# Patient Record
Sex: Male | Born: 1935 | Race: White | Hispanic: No | Marital: Single | Smoking: Former smoker
Health system: Southern US, Community
[De-identification: ages and names within clinical notes are randomized; demographics above are authoritative.]

## PROBLEM LIST (undated history)

## (undated) DIAGNOSIS — I509 Heart failure, unspecified: Secondary | ICD-10-CM

## (undated) DIAGNOSIS — N400 Enlarged prostate without lower urinary tract symptoms: Secondary | ICD-10-CM

## (undated) DIAGNOSIS — F0390 Unspecified dementia without behavioral disturbance: Secondary | ICD-10-CM

## (undated) DIAGNOSIS — N390 Urinary tract infection, site not specified: Secondary | ICD-10-CM

## (undated) DIAGNOSIS — I10 Essential (primary) hypertension: Secondary | ICD-10-CM

## (undated) DIAGNOSIS — G930 Cerebral cysts: Secondary | ICD-10-CM

## (undated) DIAGNOSIS — M47816 Spondylosis without myelopathy or radiculopathy, lumbar region: Secondary | ICD-10-CM

## (undated) DIAGNOSIS — I4891 Unspecified atrial fibrillation: Secondary | ICD-10-CM

## (undated) DIAGNOSIS — A419 Sepsis, unspecified organism: Secondary | ICD-10-CM

## (undated) DIAGNOSIS — R339 Retention of urine, unspecified: Secondary | ICD-10-CM

## (undated) HISTORY — PX: OTHER SURGICAL HISTORY: SHX169

## (undated) HISTORY — PX: SUPRAPUBIC CATHETER PLACEMENT: SHX2473

## (undated) HISTORY — PX: TRANSURETHRAL RESECTION OF PROSTATE: SHX73

---

## 2001-07-19 ENCOUNTER — Emergency Department (HOSPITAL_COMMUNITY): Admission: EM | Admit: 2001-07-19 | Discharge: 2001-07-20 | Payer: Self-pay | Admitting: *Deleted

## 2001-08-09 ENCOUNTER — Encounter: Admission: RE | Admit: 2001-08-09 | Discharge: 2001-11-04 | Payer: Self-pay | Admitting: Sports Medicine

## 2004-05-16 ENCOUNTER — Emergency Department (HOSPITAL_COMMUNITY): Admission: EM | Admit: 2004-05-16 | Discharge: 2004-05-16 | Payer: Self-pay | Admitting: Emergency Medicine

## 2004-05-27 ENCOUNTER — Inpatient Hospital Stay (HOSPITAL_COMMUNITY): Admission: EM | Admit: 2004-05-27 | Discharge: 2004-05-31 | Payer: Self-pay | Admitting: Emergency Medicine

## 2004-05-28 ENCOUNTER — Encounter: Payer: Self-pay | Admitting: Cardiology

## 2004-06-13 ENCOUNTER — Encounter: Admission: RE | Admit: 2004-06-13 | Discharge: 2004-06-13 | Payer: Self-pay | Admitting: Internal Medicine

## 2004-11-21 ENCOUNTER — Emergency Department (HOSPITAL_COMMUNITY): Admission: EM | Admit: 2004-11-21 | Discharge: 2004-11-21 | Payer: Self-pay | Admitting: Emergency Medicine

## 2005-02-03 ENCOUNTER — Ambulatory Visit (HOSPITAL_COMMUNITY): Admission: RE | Admit: 2005-02-03 | Discharge: 2005-02-03 | Payer: Self-pay | Admitting: Urology

## 2005-10-25 IMAGING — CT CT ANGIO CHEST
4 of 5 series · 19 of 30 positions shown · IV contrast (140)
Comparison: none

CLINICAL DATA: Chest tightness, cough, and shortness of breath. 
 CHEST CT ANGIO WITH CONTRAST
 Multidetector CT imaging of the chest was performed according to the protocol for detection of pulmonary embolism during IV bolus injection of 200 ml Omnipaque 300.  Coronal and sagittal plane reformatted images were also generated.  
 The pulmonary arteries are well opacified on the study to the subsegmental level and demonstrate no filling defects by CT to suggest pulmonary embolism.  Lung windows show no focal infiltrates or edema. No pleural effusions.  No adenopathy is seen in the chest. 

 IMPRESSION
 No evidence of pulmonary embolism or other acute findings in the chest.

[Series 4: recon 2: pe · axial · 0.60mm/px · z∈[-209,-59]mm · 5 of 101 slices shown]
[im 21/101  lung]
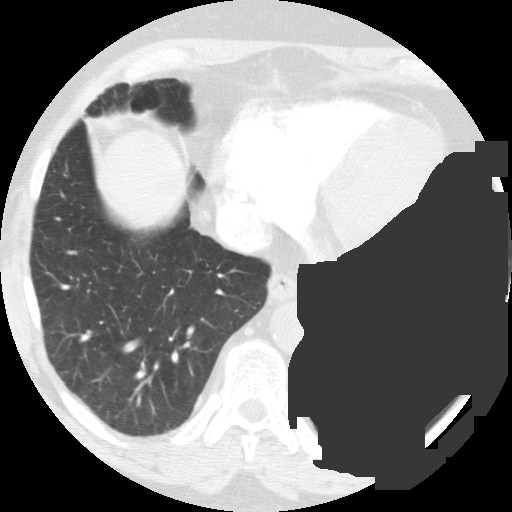
[im 41/101  mediastinal]
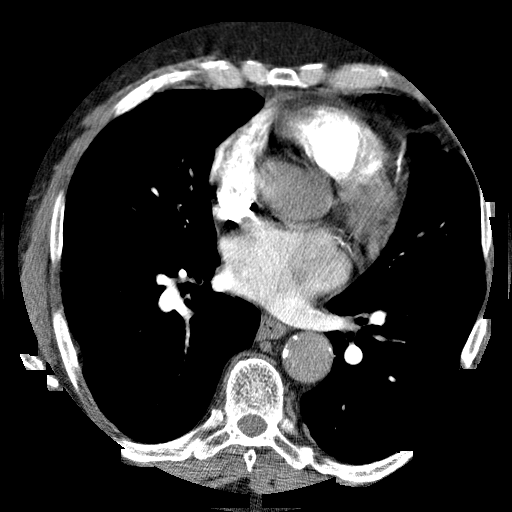
[im 53/101  lung]
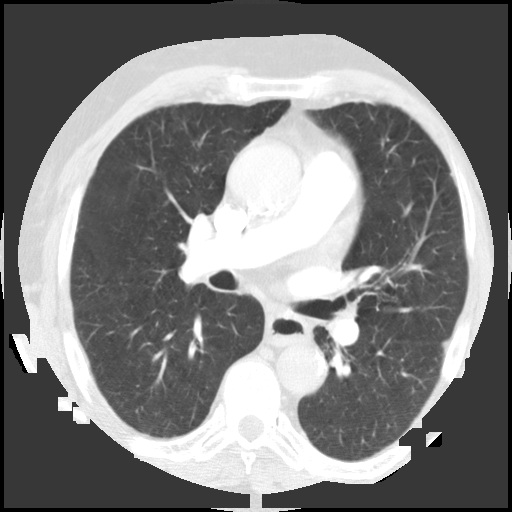
[im 61/101  mediastinal]
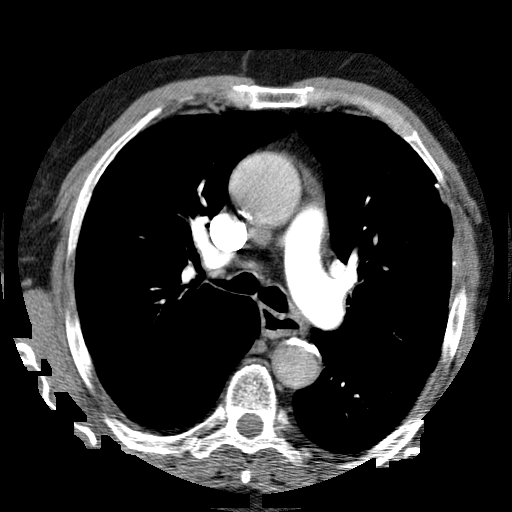
[im 81/101  lung]
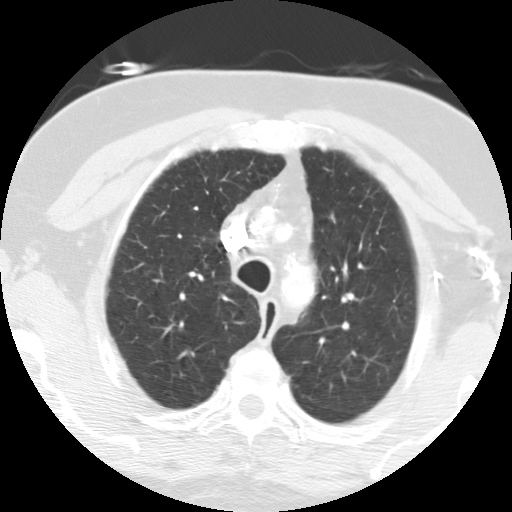

[Series 5: recon 3: pe · axial · 0.60mm/px · z∈[-209,-59]mm · 5 of 101 slices shown]
[im 21/101  lung]
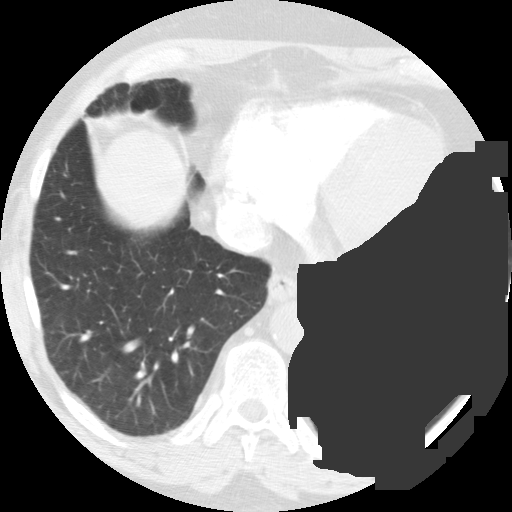
[im 41/101  lung]
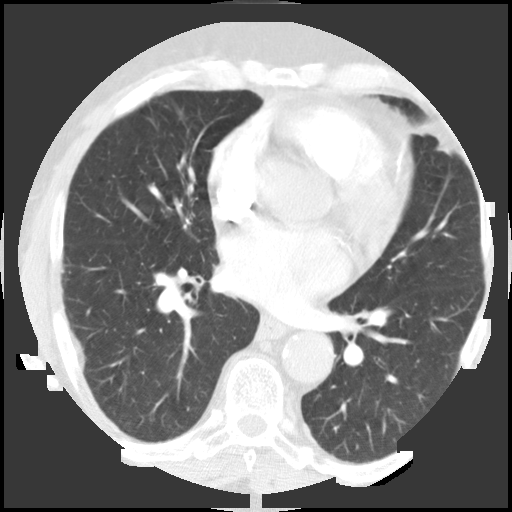
[im 53/101  lung]
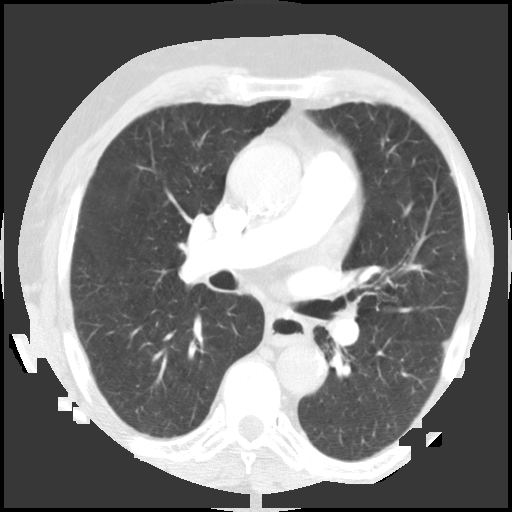
[im 61/101  lung]
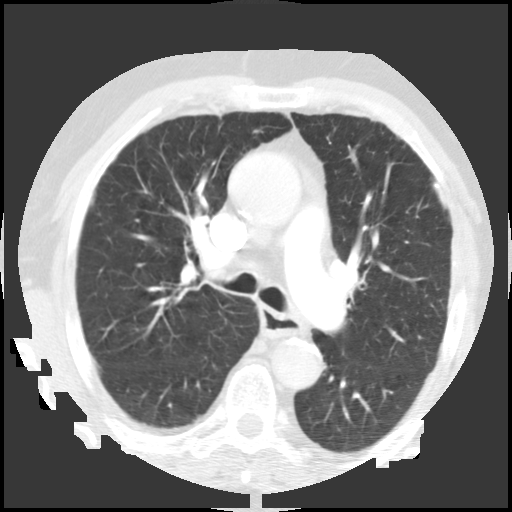
[im 81/101  lung]
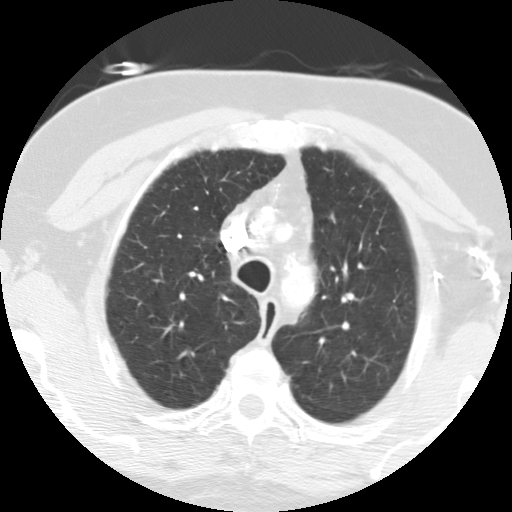

[Series 686: reformatted · sagittal · 0.60mm/px · 5 of 113 slices shown (1 of 2)]
[im 19/113  lung]
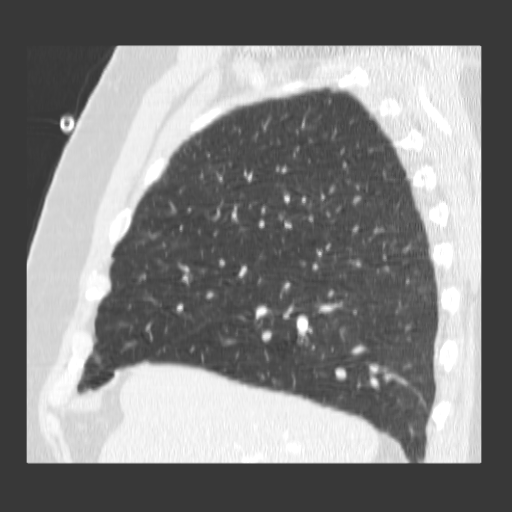
[im 38/113  lung]
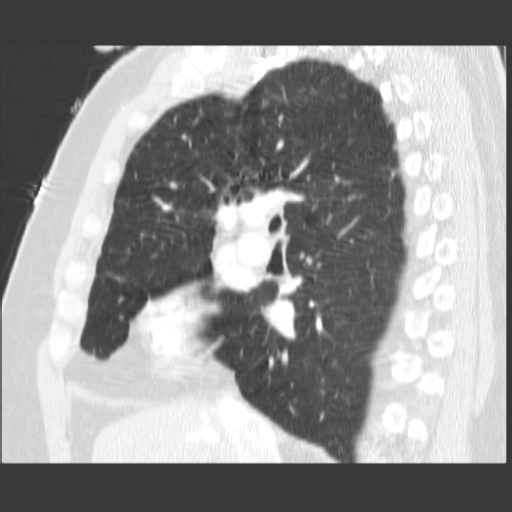
[im 57/113  lung]
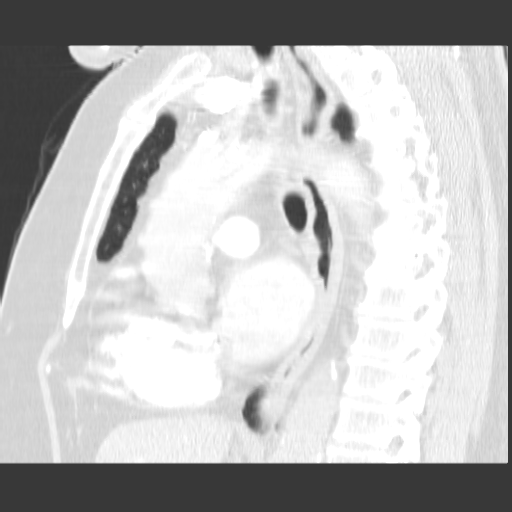
[im 75/113  lung]
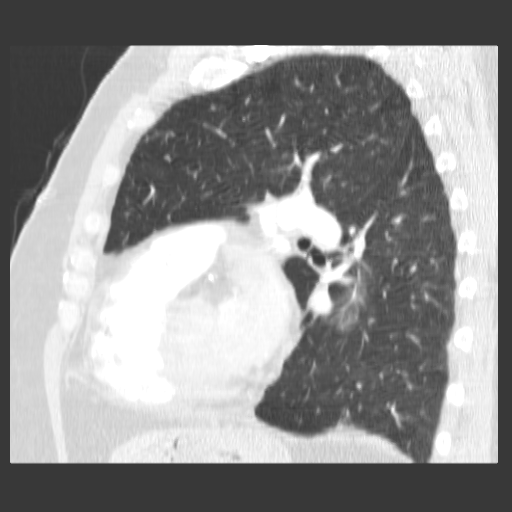
[im 94/113  lung]
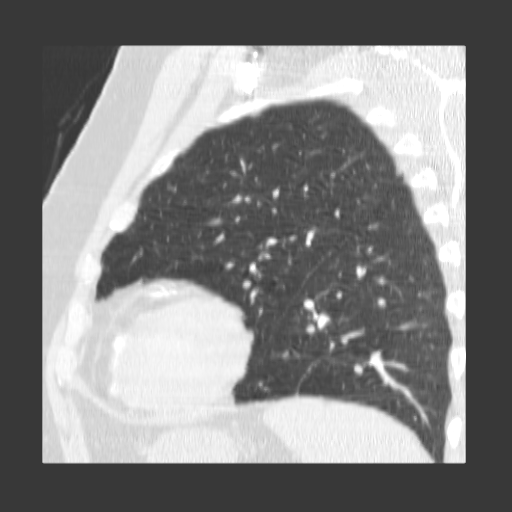

[Series 687: reformatted · coronal · 0.60mm/px · 4 of 98 slices shown (2 of 2)]
[im 20/98  lung]
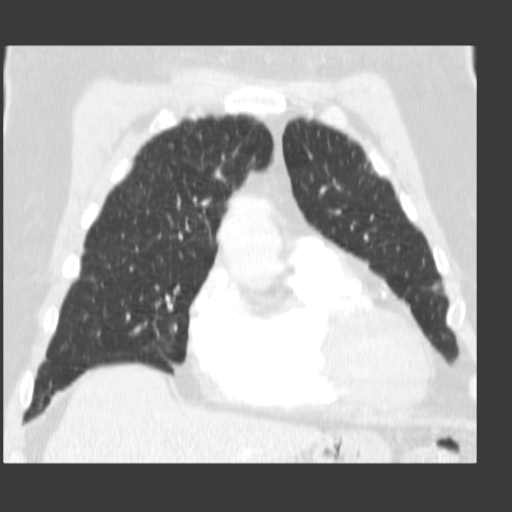
[im 39/98  lung]
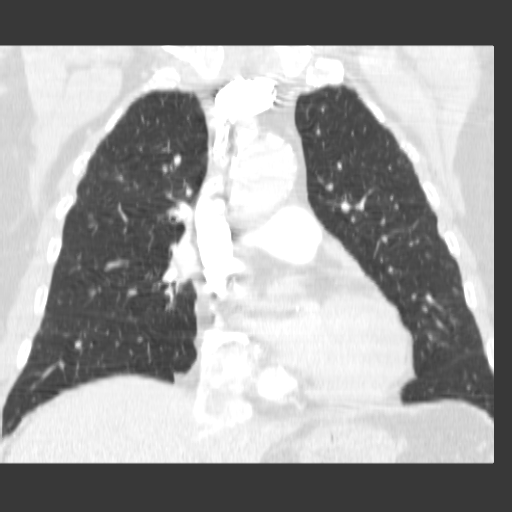
[im 59/98  lung]
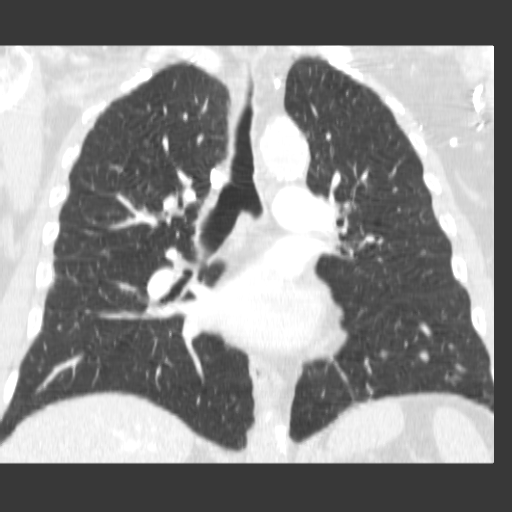
[im 78/98  lung]
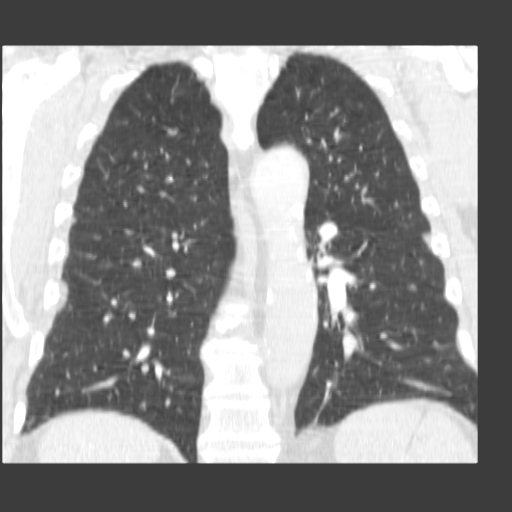

[19 of 30 positions shown; findings below may reference images not displayed]

## 2007-10-05 ENCOUNTER — Emergency Department (HOSPITAL_COMMUNITY): Admission: EM | Admit: 2007-10-05 | Discharge: 2007-10-06 | Payer: Self-pay | Admitting: Emergency Medicine

## 2008-11-27 ENCOUNTER — Ambulatory Visit (HOSPITAL_COMMUNITY): Admission: RE | Admit: 2008-11-27 | Discharge: 2008-11-27 | Payer: Self-pay | Admitting: *Deleted

## 2009-10-23 ENCOUNTER — Emergency Department (HOSPITAL_COMMUNITY): Admission: EM | Admit: 2009-10-23 | Discharge: 2009-10-23 | Payer: Self-pay | Admitting: Emergency Medicine

## 2009-11-03 ENCOUNTER — Emergency Department (HOSPITAL_COMMUNITY): Admission: EM | Admit: 2009-11-03 | Discharge: 2009-11-03 | Payer: Self-pay | Admitting: Emergency Medicine

## 2009-11-06 ENCOUNTER — Emergency Department (HOSPITAL_COMMUNITY): Admission: EM | Admit: 2009-11-06 | Discharge: 2009-11-06 | Payer: Self-pay | Admitting: Emergency Medicine

## 2009-11-07 ENCOUNTER — Emergency Department (HOSPITAL_COMMUNITY): Admission: EM | Admit: 2009-11-07 | Discharge: 2009-11-07 | Payer: Self-pay | Admitting: Emergency Medicine

## 2009-11-28 ENCOUNTER — Emergency Department (HOSPITAL_COMMUNITY): Admission: EM | Admit: 2009-11-28 | Discharge: 2009-11-28 | Payer: Self-pay | Admitting: Emergency Medicine

## 2009-12-15 ENCOUNTER — Emergency Department (HOSPITAL_COMMUNITY): Admission: EM | Admit: 2009-12-15 | Discharge: 2009-12-16 | Payer: Self-pay | Admitting: Emergency Medicine

## 2009-12-21 ENCOUNTER — Emergency Department (HOSPITAL_COMMUNITY): Admission: EM | Admit: 2009-12-21 | Discharge: 2009-12-21 | Payer: Self-pay | Admitting: Emergency Medicine

## 2009-12-25 ENCOUNTER — Emergency Department (HOSPITAL_COMMUNITY): Admission: EM | Admit: 2009-12-25 | Discharge: 2009-12-25 | Payer: Self-pay | Admitting: Emergency Medicine

## 2009-12-29 ENCOUNTER — Emergency Department (HOSPITAL_COMMUNITY): Admission: EM | Admit: 2009-12-29 | Discharge: 2009-12-29 | Payer: Self-pay | Admitting: Emergency Medicine

## 2010-01-01 ENCOUNTER — Emergency Department (HOSPITAL_COMMUNITY): Admission: EM | Admit: 2010-01-01 | Discharge: 2010-01-02 | Payer: Self-pay | Admitting: Emergency Medicine

## 2010-01-02 ENCOUNTER — Emergency Department (HOSPITAL_COMMUNITY): Admission: EM | Admit: 2010-01-02 | Discharge: 2010-01-02 | Payer: Self-pay | Admitting: Emergency Medicine

## 2010-01-03 ENCOUNTER — Emergency Department (HOSPITAL_COMMUNITY): Admission: EM | Admit: 2010-01-03 | Discharge: 2010-01-04 | Payer: Self-pay | Admitting: Emergency Medicine

## 2010-01-03 ENCOUNTER — Emergency Department (HOSPITAL_COMMUNITY): Admission: EM | Admit: 2010-01-03 | Discharge: 2010-01-03 | Payer: Self-pay | Admitting: Emergency Medicine

## 2010-01-06 ENCOUNTER — Emergency Department (HOSPITAL_COMMUNITY): Admission: EM | Admit: 2010-01-06 | Discharge: 2010-01-06 | Payer: Self-pay | Admitting: Emergency Medicine

## 2010-01-08 ENCOUNTER — Emergency Department (HOSPITAL_COMMUNITY): Admission: EM | Admit: 2010-01-08 | Discharge: 2010-01-08 | Payer: Self-pay | Admitting: Emergency Medicine

## 2010-01-10 ENCOUNTER — Emergency Department (HOSPITAL_COMMUNITY): Admission: EM | Admit: 2010-01-10 | Discharge: 2010-01-10 | Payer: Self-pay | Admitting: Emergency Medicine

## 2010-01-12 ENCOUNTER — Emergency Department (HOSPITAL_COMMUNITY): Admission: EM | Admit: 2010-01-12 | Discharge: 2010-01-12 | Payer: Self-pay | Admitting: Emergency Medicine

## 2010-01-13 ENCOUNTER — Emergency Department (HOSPITAL_COMMUNITY): Admission: EM | Admit: 2010-01-13 | Discharge: 2010-01-13 | Payer: Self-pay | Admitting: Emergency Medicine

## 2010-01-14 ENCOUNTER — Emergency Department (HOSPITAL_COMMUNITY): Admission: EM | Admit: 2010-01-14 | Discharge: 2010-01-14 | Payer: Self-pay | Admitting: Emergency Medicine

## 2010-01-17 ENCOUNTER — Emergency Department (HOSPITAL_COMMUNITY)
Admission: EM | Admit: 2010-01-17 | Discharge: 2010-01-17 | Payer: Self-pay | Source: Home / Self Care | Admitting: Emergency Medicine

## 2010-01-19 ENCOUNTER — Emergency Department (HOSPITAL_COMMUNITY): Admission: EM | Admit: 2010-01-19 | Discharge: 2010-01-19 | Payer: Self-pay | Admitting: Emergency Medicine

## 2010-01-22 ENCOUNTER — Emergency Department (HOSPITAL_COMMUNITY): Admission: EM | Admit: 2010-01-22 | Discharge: 2010-01-22 | Payer: Self-pay | Admitting: Emergency Medicine

## 2010-01-23 ENCOUNTER — Emergency Department (HOSPITAL_COMMUNITY): Admission: EM | Admit: 2010-01-23 | Discharge: 2010-01-23 | Payer: Self-pay | Admitting: Emergency Medicine

## 2010-01-24 ENCOUNTER — Emergency Department (HOSPITAL_COMMUNITY): Admission: EM | Admit: 2010-01-24 | Discharge: 2010-01-24 | Payer: Self-pay | Admitting: Emergency Medicine

## 2010-01-27 ENCOUNTER — Emergency Department (HOSPITAL_COMMUNITY): Admission: EM | Admit: 2010-01-27 | Discharge: 2010-01-27 | Payer: Self-pay | Admitting: Emergency Medicine

## 2010-01-28 ENCOUNTER — Emergency Department (HOSPITAL_COMMUNITY): Admission: EM | Admit: 2010-01-28 | Discharge: 2010-01-28 | Payer: Self-pay | Admitting: Emergency Medicine

## 2010-01-31 ENCOUNTER — Emergency Department (HOSPITAL_COMMUNITY): Admission: EM | Admit: 2010-01-31 | Discharge: 2010-01-31 | Payer: Self-pay | Admitting: Emergency Medicine

## 2010-02-01 ENCOUNTER — Encounter: Payer: Self-pay | Admitting: Cardiology

## 2010-02-01 ENCOUNTER — Other Ambulatory Visit: Payer: Self-pay | Admitting: Emergency Medicine

## 2010-02-01 ENCOUNTER — Encounter: Payer: Self-pay | Admitting: Emergency Medicine

## 2010-02-01 ENCOUNTER — Ambulatory Visit: Payer: Self-pay | Admitting: Internal Medicine

## 2010-02-02 ENCOUNTER — Inpatient Hospital Stay (HOSPITAL_COMMUNITY): Admission: EM | Admit: 2010-02-02 | Discharge: 2010-02-04 | Payer: Self-pay | Admitting: Cardiology

## 2010-02-03 ENCOUNTER — Encounter: Payer: Self-pay | Admitting: Cardiology

## 2010-02-11 ENCOUNTER — Emergency Department (HOSPITAL_COMMUNITY): Admission: EM | Admit: 2010-02-11 | Discharge: 2010-02-11 | Payer: Self-pay | Admitting: Emergency Medicine

## 2010-02-17 ENCOUNTER — Encounter (INDEPENDENT_AMBULATORY_CARE_PROVIDER_SITE_OTHER): Payer: Self-pay | Admitting: Urology

## 2010-02-17 ENCOUNTER — Inpatient Hospital Stay (HOSPITAL_COMMUNITY): Admission: RE | Admit: 2010-02-17 | Discharge: 2010-02-19 | Payer: Self-pay | Admitting: Urology

## 2010-02-23 ENCOUNTER — Emergency Department (HOSPITAL_COMMUNITY): Admission: EM | Admit: 2010-02-23 | Discharge: 2010-02-23 | Payer: Self-pay | Admitting: Emergency Medicine

## 2010-03-12 DIAGNOSIS — I10 Essential (primary) hypertension: Secondary | ICD-10-CM

## 2010-03-12 DIAGNOSIS — I4891 Unspecified atrial fibrillation: Secondary | ICD-10-CM

## 2010-03-12 DIAGNOSIS — N400 Enlarged prostate without lower urinary tract symptoms: Secondary | ICD-10-CM | POA: Insufficient documentation

## 2010-03-13 ENCOUNTER — Emergency Department (HOSPITAL_COMMUNITY): Admission: EM | Admit: 2010-03-13 | Discharge: 2010-03-13 | Payer: Self-pay | Admitting: Emergency Medicine

## 2010-03-18 DIAGNOSIS — E669 Obesity, unspecified: Secondary | ICD-10-CM

## 2010-05-28 ENCOUNTER — Emergency Department (HOSPITAL_COMMUNITY): Admission: EM | Admit: 2010-05-28 | Discharge: 2010-05-28 | Payer: Self-pay | Admitting: Emergency Medicine

## 2010-06-26 ENCOUNTER — Emergency Department (HOSPITAL_COMMUNITY): Admission: EM | Admit: 2010-06-26 | Discharge: 2010-06-26 | Payer: Self-pay | Admitting: Emergency Medicine

## 2010-07-13 ENCOUNTER — Emergency Department (HOSPITAL_COMMUNITY): Admission: EM | Admit: 2010-07-13 | Discharge: 2010-07-13 | Payer: Self-pay | Admitting: Emergency Medicine

## 2010-08-19 ENCOUNTER — Emergency Department (HOSPITAL_COMMUNITY): Admission: EM | Admit: 2010-08-19 | Discharge: 2010-08-19 | Payer: Self-pay | Admitting: Emergency Medicine

## 2010-09-01 ENCOUNTER — Emergency Department (HOSPITAL_COMMUNITY): Admission: EM | Admit: 2010-09-01 | Discharge: 2010-09-01 | Payer: Self-pay | Admitting: Emergency Medicine

## 2010-09-19 ENCOUNTER — Emergency Department (HOSPITAL_COMMUNITY): Admission: EM | Admit: 2010-09-19 | Discharge: 2010-09-19 | Payer: Self-pay | Admitting: Emergency Medicine

## 2010-09-22 ENCOUNTER — Emergency Department (HOSPITAL_COMMUNITY): Admission: EM | Admit: 2010-09-22 | Discharge: 2010-09-22 | Payer: Self-pay | Admitting: Emergency Medicine

## 2010-09-23 ENCOUNTER — Emergency Department (HOSPITAL_COMMUNITY)
Admission: EM | Admit: 2010-09-23 | Discharge: 2010-09-23 | Payer: Self-pay | Source: Home / Self Care | Admitting: Emergency Medicine

## 2010-11-04 ENCOUNTER — Emergency Department (HOSPITAL_COMMUNITY)
Admission: EM | Admit: 2010-11-04 | Discharge: 2010-11-04 | Payer: Self-pay | Source: Home / Self Care | Admitting: Emergency Medicine

## 2010-11-13 ENCOUNTER — Emergency Department (HOSPITAL_COMMUNITY)
Admission: EM | Admit: 2010-11-13 | Discharge: 2010-11-14 | Payer: Self-pay | Source: Home / Self Care | Admitting: Emergency Medicine

## 2010-11-23 ENCOUNTER — Emergency Department (HOSPITAL_COMMUNITY)
Admission: EM | Admit: 2010-11-23 | Discharge: 2010-11-23 | Payer: Self-pay | Source: Home / Self Care | Admitting: Emergency Medicine

## 2010-11-27 ENCOUNTER — Emergency Department (HOSPITAL_COMMUNITY)
Admission: EM | Admit: 2010-11-27 | Discharge: 2010-11-27 | Payer: Self-pay | Source: Home / Self Care | Admitting: Emergency Medicine

## 2010-11-30 ENCOUNTER — Emergency Department (HOSPITAL_COMMUNITY)
Admission: EM | Admit: 2010-11-30 | Discharge: 2010-11-30 | Payer: Self-pay | Source: Home / Self Care | Admitting: Emergency Medicine

## 2010-12-01 LAB — URINE CULTURE
Colony Count: 75000
Colony Count: >=100000
Culture  Setup Time: 201201082212
Culture  Setup Time: 201201130112

## 2010-12-01 LAB — URINALYSIS, ROUTINE W REFLEX MICROSCOPIC
Bilirubin Urine: NEGATIVE
Bilirubin Urine: NEGATIVE
Bilirubin Urine: NEGATIVE
Ketones, ur: NEGATIVE mg/dL
Ketones, ur: NEGATIVE mg/dL
Ketones, ur: NEGATIVE mg/dL
Nitrite: POSITIVE — AB
Nitrite: POSITIVE — AB
Nitrite: POSITIVE — AB
Protein, ur: 30 mg/dL — AB
Protein, ur: NEGATIVE mg/dL
Protein, ur: 100 mg/dL — AB
Specific Gravity, Urine: >1.03 — ABNORMAL HIGH (ref 1.005–1.030)
Specific Gravity, Urine: 1.025 (ref 1.005–1.030)
Specific Gravity, Urine: >1.03 — ABNORMAL HIGH (ref 1.005–1.030)
Urine Glucose, Fasting: NEGATIVE mg/dL
Urine Glucose, Fasting: NEGATIVE mg/dL
Urine Glucose, Fasting: NEGATIVE mg/dL
Urobilinogen, UA: 0.2 mg/dL (ref 0.0–1.0)
Urobilinogen, UA: 1 mg/dL (ref 0.0–1.0)
Urobilinogen, UA: 1 mg/dL (ref 0.0–1.0)
pH: 6 (ref 5.0–8.0)
pH: 6 (ref 5.0–8.0)
pH: 5 (ref 5.0–8.0)

## 2010-12-01 LAB — URINE MICROSCOPIC-ADD ON

## 2010-12-03 LAB — URINE CULTURE
Colony Count: >=100000
Culture  Setup Time: 201201161257

## 2010-12-05 ENCOUNTER — Emergency Department (HOSPITAL_COMMUNITY)
Admission: EM | Admit: 2010-12-05 | Discharge: 2010-12-05 | Payer: Self-pay | Source: Home / Self Care | Admitting: Emergency Medicine

## 2010-12-06 ENCOUNTER — Emergency Department (HOSPITAL_COMMUNITY)
Admission: EM | Admit: 2010-12-06 | Discharge: 2010-12-06 | Payer: Self-pay | Source: Home / Self Care | Admitting: Emergency Medicine

## 2010-12-30 ENCOUNTER — Emergency Department (HOSPITAL_COMMUNITY)
Admission: EM | Admit: 2010-12-30 | Discharge: 2010-12-30 | Disposition: A | Discharge status: Home / Self Care | Payer: Medicare Other | Attending: Emergency Medicine | Admitting: Emergency Medicine

## 2010-12-30 DIAGNOSIS — I4891 Unspecified atrial fibrillation: Secondary | ICD-10-CM | POA: Insufficient documentation

## 2010-12-30 DIAGNOSIS — N400 Enlarged prostate without lower urinary tract symptoms: Secondary | ICD-10-CM | POA: Insufficient documentation

## 2010-12-30 DIAGNOSIS — Z79899 Other long term (current) drug therapy: Secondary | ICD-10-CM | POA: Insufficient documentation

## 2010-12-30 DIAGNOSIS — R339 Retention of urine, unspecified: Secondary | ICD-10-CM | POA: Insufficient documentation

## 2010-12-30 DIAGNOSIS — T83091A Other mechanical complication of indwelling urethral catheter, initial encounter: Secondary | ICD-10-CM | POA: Insufficient documentation

## 2011-01-05 ENCOUNTER — Emergency Department (HOSPITAL_COMMUNITY)
Admission: EM | Admit: 2011-01-05 | Discharge: 2011-01-05 | Disposition: A | Discharge status: Home / Self Care | Payer: Medicare Other | Attending: Emergency Medicine | Admitting: Emergency Medicine

## 2011-01-05 DIAGNOSIS — Y846 Urinary catheterization as the cause of abnormal reaction of the patient, or of later complication, without mention of misadventure at the time of the procedure: Secondary | ICD-10-CM | POA: Insufficient documentation

## 2011-01-05 DIAGNOSIS — T8389XA Other specified complication of genitourinary prosthetic devices, implants and grafts, initial encounter: Secondary | ICD-10-CM | POA: Insufficient documentation

## 2011-01-05 LAB — URINE MICROSCOPIC-ADD ON

## 2011-01-05 LAB — URINALYSIS, ROUTINE W REFLEX MICROSCOPIC
Nitrite: POSITIVE — AB
Specific Gravity, Urine: >1.03 — ABNORMAL HIGH (ref 1.005–1.030)
Urine Glucose, Fasting: NEGATIVE mg/dL
pH: 6 (ref 5.0–8.0)

## 2011-01-06 ENCOUNTER — Emergency Department (HOSPITAL_COMMUNITY)
Admission: EM | Admit: 2011-01-06 | Discharge: 2011-01-06 | Disposition: A | Discharge status: Home / Self Care | Payer: Medicare Other | Attending: Emergency Medicine | Admitting: Emergency Medicine

## 2011-01-06 DIAGNOSIS — R339 Retention of urine, unspecified: Secondary | ICD-10-CM | POA: Insufficient documentation

## 2011-01-06 DIAGNOSIS — Z79899 Other long term (current) drug therapy: Secondary | ICD-10-CM | POA: Insufficient documentation

## 2011-01-06 DIAGNOSIS — T83091A Other mechanical complication of indwelling urethral catheter, initial encounter: Secondary | ICD-10-CM | POA: Insufficient documentation

## 2011-01-06 DIAGNOSIS — J45909 Unspecified asthma, uncomplicated: Secondary | ICD-10-CM | POA: Insufficient documentation

## 2011-01-06 DIAGNOSIS — N400 Enlarged prostate without lower urinary tract symptoms: Secondary | ICD-10-CM | POA: Insufficient documentation

## 2011-01-06 LAB — URINE MICROSCOPIC-ADD ON

## 2011-01-06 LAB — URINALYSIS, ROUTINE W REFLEX MICROSCOPIC
Bilirubin Urine: NEGATIVE
Ketones, ur: NEGATIVE mg/dL
Nitrite: POSITIVE — AB

## 2011-01-08 LAB — URINE CULTURE
Colony Count: >=100000
Culture  Setup Time: 201202210151

## 2011-01-09 ENCOUNTER — Ambulatory Visit: Payer: PRIVATE HEALTH INSURANCE | Admitting: Urology

## 2011-01-15 ENCOUNTER — Emergency Department (HOSPITAL_COMMUNITY)
Admission: EM | Admit: 2011-01-15 | Discharge: 2011-01-15 | Disposition: A | Discharge status: Home / Self Care | Payer: Medicare Other | Attending: Emergency Medicine | Admitting: Emergency Medicine

## 2011-01-15 DIAGNOSIS — N390 Urinary tract infection, site not specified: Secondary | ICD-10-CM | POA: Insufficient documentation

## 2011-01-15 LAB — URINALYSIS, ROUTINE W REFLEX MICROSCOPIC
Bilirubin Urine: NEGATIVE
Ketones, ur: NEGATIVE mg/dL
Nitrite: POSITIVE — AB
pH: 5.5 (ref 5.0–8.0)

## 2011-01-15 LAB — URINE MICROSCOPIC-ADD ON

## 2011-01-17 LAB — URINE CULTURE
Colony Count: >=100000
Culture  Setup Time: 201203020131

## 2011-01-23 ENCOUNTER — Emergency Department (HOSPITAL_COMMUNITY)
Admission: EM | Admit: 2011-01-23 | Discharge: 2011-01-23 | Disposition: A | Discharge status: Home / Self Care | Payer: Medicare Other | Attending: Emergency Medicine | Admitting: Emergency Medicine

## 2011-01-23 DIAGNOSIS — J45909 Unspecified asthma, uncomplicated: Secondary | ICD-10-CM | POA: Insufficient documentation

## 2011-01-23 DIAGNOSIS — I4891 Unspecified atrial fibrillation: Secondary | ICD-10-CM | POA: Insufficient documentation

## 2011-01-23 DIAGNOSIS — Y929 Unspecified place or not applicable: Secondary | ICD-10-CM | POA: Insufficient documentation

## 2011-01-23 DIAGNOSIS — Y846 Urinary catheterization as the cause of abnormal reaction of the patient, or of later complication, without mention of misadventure at the time of the procedure: Secondary | ICD-10-CM | POA: Insufficient documentation

## 2011-01-23 DIAGNOSIS — T83091A Other mechanical complication of indwelling urethral catheter, initial encounter: Secondary | ICD-10-CM | POA: Insufficient documentation

## 2011-01-26 LAB — URINE CULTURE: Colony Count: >=100000

## 2011-01-26 LAB — URINALYSIS, ROUTINE W REFLEX MICROSCOPIC
Bilirubin Urine: NEGATIVE
Glucose, UA: NEGATIVE mg/dL
Ketones, ur: NEGATIVE mg/dL
Nitrite: NEGATIVE
Protein, ur: 100 mg/dL — AB
Specific Gravity, Urine: >1.03 — ABNORMAL HIGH (ref 1.005–1.030)
Urobilinogen, UA: 0.2 mg/dL (ref 0.0–1.0)
pH: 6 (ref 5.0–8.0)

## 2011-01-26 LAB — URINE MICROSCOPIC-ADD ON

## 2011-01-27 LAB — URINALYSIS, ROUTINE W REFLEX MICROSCOPIC
Glucose, UA: NEGATIVE mg/dL
Ketones, ur: NEGATIVE mg/dL
Nitrite: POSITIVE — AB
Protein, ur: 30 mg/dL — AB
Protein, ur: NEGATIVE mg/dL
Urobilinogen, UA: 0.2 mg/dL (ref 0.0–1.0)

## 2011-01-27 LAB — URINE CULTURE

## 2011-01-27 LAB — URINE MICROSCOPIC-ADD ON

## 2011-01-28 ENCOUNTER — Emergency Department (HOSPITAL_COMMUNITY)
Admission: EM | Admit: 2011-01-28 | Discharge: 2011-01-28 | Disposition: A | Discharge status: Home / Self Care | Payer: Medicare Other | Attending: Emergency Medicine | Admitting: Emergency Medicine

## 2011-01-28 DIAGNOSIS — N489 Disorder of penis, unspecified: Secondary | ICD-10-CM | POA: Insufficient documentation

## 2011-01-28 DIAGNOSIS — Y849 Medical procedure, unspecified as the cause of abnormal reaction of the patient, or of later complication, without mention of misadventure at the time of the procedure: Secondary | ICD-10-CM | POA: Insufficient documentation

## 2011-01-28 DIAGNOSIS — T8389XA Other specified complication of genitourinary prosthetic devices, implants and grafts, initial encounter: Secondary | ICD-10-CM | POA: Insufficient documentation

## 2011-01-28 LAB — URINE MICROSCOPIC-ADD ON

## 2011-01-28 LAB — URINALYSIS, ROUTINE W REFLEX MICROSCOPIC
Glucose, UA: NEGATIVE mg/dL
Protein, ur: NEGATIVE mg/dL
Specific Gravity, Urine: 1.02 (ref 1.005–1.030)

## 2011-02-01 LAB — URINE CULTURE
Colony Count: >=100000
Colony Count: >=100000

## 2011-02-01 LAB — URINALYSIS, ROUTINE W REFLEX MICROSCOPIC
Ketones, ur: NEGATIVE mg/dL
Ketones, ur: NEGATIVE mg/dL
Leukocytes, UA: NEGATIVE
Nitrite: NEGATIVE
Protein, ur: 100 mg/dL — AB
Protein, ur: NEGATIVE mg/dL
Urobilinogen, UA: 0.2 mg/dL (ref 0.0–1.0)
Urobilinogen, UA: 0.2 mg/dL (ref 0.0–1.0)

## 2011-02-01 LAB — URINE MICROSCOPIC-ADD ON

## 2011-02-03 LAB — URINALYSIS, ROUTINE W REFLEX MICROSCOPIC
Ketones, ur: NEGATIVE mg/dL
Urobilinogen, UA: 0.2 mg/dL (ref 0.0–1.0)

## 2011-02-03 LAB — URINE MICROSCOPIC-ADD ON

## 2011-02-03 LAB — POCT I-STAT, CHEM 8
Calcium, Ion: 1.03 mmol/L — ABNORMAL LOW (ref 1.12–1.32)
Chloride: 101 mEq/L (ref 96–112)
Creatinine, Ser: 1.1 mg/dL (ref 0.4–1.5)
Glucose, Bld: 136 mg/dL — ABNORMAL HIGH (ref 70–99)
HCT: 49 % (ref 39.0–52.0)

## 2011-02-04 LAB — CBC
HCT: 44.6 % (ref 39.0–52.0)
Hemoglobin: 15.2 g/dL (ref 13.0–17.0)
MCV: 93.1 fL (ref 78.0–100.0)
RBC: 4.79 MIL/uL (ref 4.22–5.81)
WBC: 10.6 10*3/uL — ABNORMAL HIGH (ref 4.0–10.5)

## 2011-02-04 LAB — BASIC METABOLIC PANEL
BUN: 14 mg/dL (ref 6–23)
Chloride: 103 mEq/L (ref 96–112)
GFR calc non Af Amer: 49 mL/min — ABNORMAL LOW (ref >60)
Potassium: 4.3 mEq/L (ref 3.5–5.1)
Sodium: 138 mEq/L (ref 135–145)

## 2011-02-04 LAB — URINALYSIS, ROUTINE W REFLEX MICROSCOPIC
Glucose, UA: NEGATIVE mg/dL
Nitrite: NEGATIVE
Protein, ur: 30 mg/dL — AB
Specific Gravity, Urine: 1.02 (ref 1.005–1.030)
Urobilinogen, UA: 0.2 mg/dL (ref 0.0–1.0)
pH: 5.5 (ref 5.0–8.0)

## 2011-02-04 LAB — URINE CULTURE

## 2011-02-04 LAB — URINE MICROSCOPIC-ADD ON

## 2011-02-06 ENCOUNTER — Ambulatory Visit (INDEPENDENT_AMBULATORY_CARE_PROVIDER_SITE_OTHER): Payer: Medicare Other | Admitting: Urology

## 2011-02-06 DIAGNOSIS — N138 Other obstructive and reflux uropathy: Secondary | ICD-10-CM

## 2011-02-06 DIAGNOSIS — N401 Enlarged prostate with lower urinary tract symptoms: Secondary | ICD-10-CM

## 2011-02-06 DIAGNOSIS — R338 Other retention of urine: Secondary | ICD-10-CM

## 2011-02-08 LAB — CBC
HCT: 43 % (ref 39.0–52.0)
HCT: 44.4 % (ref 39.0–52.0)
Hemoglobin: 14.8 g/dL (ref 13.0–17.0)
Hemoglobin: 15.2 g/dL (ref 13.0–17.0)
Hemoglobin: 15.2 g/dL (ref 13.0–17.0)
MCV: 93.1 fL (ref 78.0–100.0)
RBC: 4.62 MIL/uL (ref 4.22–5.81)
RBC: 4.77 MIL/uL (ref 4.22–5.81)
RBC: 4.82 MIL/uL (ref 4.22–5.81)
RDW: 14.1 % (ref 11.5–15.5)
WBC: 6.4 10*3/uL (ref 4.0–10.5)
WBC: 7.2 10*3/uL (ref 4.0–10.5)

## 2011-02-08 LAB — CARDIAC PANEL(CRET KIN+CKTOT+MB+TROPI)
CK, MB: 2.7 ng/mL (ref 0.3–4.0)
CK, MB: 2.8 ng/mL (ref 0.3–4.0)
CK, MB: 2.8 ng/mL (ref 0.3–4.0)
Relative Index: 1.7 (ref 0.0–2.5)
Relative Index: 2.6 — ABNORMAL HIGH (ref 0.0–2.5)
Troponin I: 0.01 ng/mL (ref 0.00–0.06)

## 2011-02-08 LAB — DIFFERENTIAL
Eosinophils Absolute: 0.2 10*3/uL (ref 0.0–0.7)
Eosinophils Relative: 3 % (ref 0–5)
Lymphocytes Relative: 19 % (ref 12–46)
Lymphs Abs: 1.2 10*3/uL (ref 0.7–4.0)
Monocytes Absolute: 0.6 10*3/uL (ref 0.1–1.0)
Monocytes Absolute: 0.7 10*3/uL (ref 0.1–1.0)
Monocytes Relative: 10 % (ref 3–12)
Monocytes Relative: 11 % (ref 3–12)
Neutro Abs: 4.3 10*3/uL (ref 1.7–7.7)
Neutrophils Relative %: 67 % (ref 43–77)

## 2011-02-08 LAB — PROTIME-INR: Prothrombin Time: 13.5 seconds (ref 11.6–15.2)

## 2011-02-08 LAB — BASIC METABOLIC PANEL
CO2: 28 mEq/L (ref 19–32)
Calcium: 8.8 mg/dL (ref 8.4–10.5)
GFR calc Af Amer: >60 mL/min (ref >60)
GFR calc non Af Amer: >60 mL/min (ref >60)
Potassium: 4 mEq/L (ref 3.5–5.1)
Sodium: 140 mEq/L (ref 135–145)

## 2011-02-08 LAB — COMPREHENSIVE METABOLIC PANEL
ALT: 23 U/L (ref 0–53)
Alkaline Phosphatase: 40 U/L (ref 39–117)
BUN: 15 mg/dL (ref 6–23)
CO2: 28 mEq/L (ref 19–32)
Chloride: 101 mEq/L (ref 96–112)
Glucose, Bld: 110 mg/dL — ABNORMAL HIGH (ref 70–99)
Potassium: 3.9 mEq/L (ref 3.5–5.1)
Sodium: 140 mEq/L (ref 135–145)
Total Bilirubin: 0.9 mg/dL (ref 0.3–1.2)
Total Protein: 5.7 g/dL — ABNORMAL LOW (ref 6.0–8.3)

## 2011-02-08 LAB — LIPID PANEL
Triglycerides: 72 mg/dL (ref <150)
VLDL: 14 mg/dL (ref 0–40)

## 2011-02-08 LAB — TSH: TSH: 1.237 u[IU]/mL (ref 0.350–4.500)

## 2011-02-08 LAB — HEMOGLOBIN A1C: Hgb A1c MFr Bld: 6.4 % — ABNORMAL HIGH (ref 4.6–6.1)

## 2011-02-09 LAB — POCT CARDIAC MARKERS
CKMB, poc: 2 ng/mL (ref 1.0–8.0)
Myoglobin, poc: 116 ng/mL (ref 12–200)
Troponin i, poc: <0.05 ng/mL (ref 0.00–0.09)

## 2011-02-09 LAB — POCT I-STAT, CHEM 8
Calcium, Ion: 1.19 mmol/L (ref 1.12–1.32)
HCT: 50 % (ref 39.0–52.0)
TCO2: 30 mmol/L (ref 0–100)

## 2011-02-09 LAB — URINE MICROSCOPIC-ADD ON

## 2011-02-09 LAB — URINALYSIS, ROUTINE W REFLEX MICROSCOPIC
Bilirubin Urine: NEGATIVE
Nitrite: POSITIVE — AB
Nitrite: POSITIVE — AB
Protein, ur: 30 mg/dL — AB
Specific Gravity, Urine: >1.03 — ABNORMAL HIGH (ref 1.005–1.030)
Specific Gravity, Urine: >1.03 — ABNORMAL HIGH (ref 1.005–1.030)
Urobilinogen, UA: 0.2 mg/dL (ref 0.0–1.0)
Urobilinogen, UA: 0.2 mg/dL (ref 0.0–1.0)
Urobilinogen, UA: 0.2 mg/dL (ref 0.0–1.0)

## 2011-02-09 LAB — DIFFERENTIAL
Basophils Absolute: 0 10*3/uL (ref 0.0–0.1)
Basophils Relative: 0 % (ref 0–1)
Eosinophils Absolute: 0.1 10*3/uL (ref 0.0–0.7)
Eosinophils Absolute: 0.1 10*3/uL (ref 0.0–0.7)
Lymphs Abs: 1.4 10*3/uL (ref 0.7–4.0)
Neutro Abs: 6 10*3/uL (ref 1.7–7.7)
Neutro Abs: 6.1 10*3/uL (ref 1.7–7.7)
Neutrophils Relative %: 79 % — ABNORMAL HIGH (ref 43–77)
Neutrophils Relative %: 74 % (ref 43–77)

## 2011-02-09 LAB — URINE CULTURE

## 2011-02-09 LAB — BASIC METABOLIC PANEL
BUN: 20 mg/dL (ref 6–23)
CO2: 30 mEq/L (ref 19–32)
Chloride: 97 mEq/L (ref 96–112)
Glucose, Bld: 123 mg/dL — ABNORMAL HIGH (ref 70–99)
Potassium: 3.9 mEq/L (ref 3.5–5.1)

## 2011-02-09 LAB — PROTIME-INR
INR: 1.04 (ref 0.00–1.49)
Prothrombin Time: 13.5 seconds (ref 11.6–15.2)

## 2011-02-09 LAB — CBC
MCHC: 34.4 g/dL (ref 30.0–36.0)
MCV: 90.6 fL (ref 78.0–100.0)
MCV: 92.9 fL (ref 78.0–100.0)
Platelets: 155 10*3/uL (ref 150–400)
Platelets: 168 10*3/uL (ref 150–400)
RDW: 14.2 % (ref 11.5–15.5)
WBC: 8.2 10*3/uL (ref 4.0–10.5)

## 2011-02-09 LAB — APTT: aPTT: 30 seconds (ref 24–37)

## 2011-02-09 LAB — BRAIN NATRIURETIC PEPTIDE: Pro B Natriuretic peptide (BNP): 69.3 pg/mL (ref 0.0–100.0)

## 2011-02-16 LAB — URINALYSIS, ROUTINE W REFLEX MICROSCOPIC
Nitrite: POSITIVE — AB
Protein, ur: 30 mg/dL — AB
Specific Gravity, Urine: 1.025 (ref 1.005–1.030)
Urobilinogen, UA: 0.2 mg/dL (ref 0.0–1.0)

## 2011-02-16 LAB — URINE MICROSCOPIC-ADD ON

## 2011-02-16 LAB — URINE CULTURE

## 2011-02-17 LAB — URINALYSIS, ROUTINE W REFLEX MICROSCOPIC
Glucose, UA: NEGATIVE mg/dL
Ketones, ur: NEGATIVE mg/dL
Leukocytes, UA: NEGATIVE
Nitrite: NEGATIVE
pH: 6.5 (ref 5.0–8.0)

## 2011-02-17 LAB — URINE CULTURE: Colony Count: NO GROWTH

## 2011-02-17 LAB — URINE MICROSCOPIC-ADD ON

## 2011-02-25 ENCOUNTER — Emergency Department (HOSPITAL_COMMUNITY)
Admission: EM | Admit: 2011-02-25 | Discharge: 2011-02-25 | Disposition: A | Discharge status: Home / Self Care | Payer: Medicare Other | Attending: Emergency Medicine | Admitting: Emergency Medicine

## 2011-02-25 DIAGNOSIS — Z79899 Other long term (current) drug therapy: Secondary | ICD-10-CM | POA: Insufficient documentation

## 2011-02-25 DIAGNOSIS — T83091A Other mechanical complication of indwelling urethral catheter, initial encounter: Secondary | ICD-10-CM | POA: Insufficient documentation

## 2011-02-25 DIAGNOSIS — I4891 Unspecified atrial fibrillation: Secondary | ICD-10-CM | POA: Insufficient documentation

## 2011-02-25 DIAGNOSIS — J45909 Unspecified asthma, uncomplicated: Secondary | ICD-10-CM | POA: Insufficient documentation

## 2011-02-25 DIAGNOSIS — Y846 Urinary catheterization as the cause of abnormal reaction of the patient, or of later complication, without mention of misadventure at the time of the procedure: Secondary | ICD-10-CM | POA: Insufficient documentation

## 2011-02-25 DIAGNOSIS — Z466 Encounter for fitting and adjustment of urinary device: Secondary | ICD-10-CM | POA: Insufficient documentation

## 2011-03-02 ENCOUNTER — Emergency Department (HOSPITAL_COMMUNITY): Payer: Medicare Other

## 2011-03-02 LAB — BASIC METABOLIC PANEL
BUN: 15 mg/dL (ref 6–23)
CO2: 26 mEq/L (ref 19–32)
Calcium: 9 mg/dL (ref 8.4–10.5)
Chloride: 104 mEq/L (ref 96–112)
Creatinine, Ser: 0.99 mg/dL (ref 0.4–1.5)
Glucose, Bld: 167 mg/dL — ABNORMAL HIGH (ref 70–99)

## 2011-03-04 ENCOUNTER — Emergency Department (HOSPITAL_COMMUNITY)
Admission: EM | Admit: 2011-03-04 | Discharge: 2011-03-04 | Discharge status: Against Medical Advice | Payer: Medicare Other | Attending: Emergency Medicine | Admitting: Emergency Medicine

## 2011-03-04 DIAGNOSIS — J45909 Unspecified asthma, uncomplicated: Secondary | ICD-10-CM | POA: Insufficient documentation

## 2011-03-04 DIAGNOSIS — Y846 Urinary catheterization as the cause of abnormal reaction of the patient, or of later complication, without mention of misadventure at the time of the procedure: Secondary | ICD-10-CM | POA: Insufficient documentation

## 2011-03-04 DIAGNOSIS — T83091A Other mechanical complication of indwelling urethral catheter, initial encounter: Secondary | ICD-10-CM | POA: Insufficient documentation

## 2011-03-04 DIAGNOSIS — N400 Enlarged prostate without lower urinary tract symptoms: Secondary | ICD-10-CM | POA: Insufficient documentation

## 2011-03-04 DIAGNOSIS — R339 Retention of urine, unspecified: Secondary | ICD-10-CM | POA: Insufficient documentation

## 2011-03-04 DIAGNOSIS — I4891 Unspecified atrial fibrillation: Secondary | ICD-10-CM | POA: Insufficient documentation

## 2011-03-06 ENCOUNTER — Ambulatory Visit (HOSPITAL_COMMUNITY)
Admission: RE | Admit: 2011-03-06 | Discharge: 2011-03-06 | Disposition: A | Discharge status: Home / Self Care | Payer: Medicare Other | Source: Ambulatory Visit | Attending: Urology | Admitting: Urology

## 2011-03-06 DIAGNOSIS — Z01810 Encounter for preprocedural cardiovascular examination: Secondary | ICD-10-CM | POA: Insufficient documentation

## 2011-03-06 DIAGNOSIS — I10 Essential (primary) hypertension: Secondary | ICD-10-CM | POA: Insufficient documentation

## 2011-03-06 DIAGNOSIS — R338 Other retention of urine: Secondary | ICD-10-CM | POA: Insufficient documentation

## 2011-03-06 DIAGNOSIS — Z79899 Other long term (current) drug therapy: Secondary | ICD-10-CM | POA: Insufficient documentation

## 2011-03-20 ENCOUNTER — Ambulatory Visit (INDEPENDENT_AMBULATORY_CARE_PROVIDER_SITE_OTHER): Payer: Medicare Other | Admitting: Urology

## 2011-03-20 DIAGNOSIS — N138 Other obstructive and reflux uropathy: Secondary | ICD-10-CM

## 2011-03-20 DIAGNOSIS — N401 Enlarged prostate with lower urinary tract symptoms: Secondary | ICD-10-CM

## 2011-03-20 DIAGNOSIS — R338 Other retention of urine: Secondary | ICD-10-CM

## 2011-04-02 NOTE — Op Note (Addendum)
  [name], Vincent [name]           ACCOUNT NO.:  [id number]  MEDICAL RECORD NO.:  [id number]           PATIENT TYPE:  E  LOCATION:  [facility]                          FACILITY:  [facility]  PHYSICIAN:  Excell Seltzer. Annabell Howells, M.D.    DATE OF BIRTH:  Feb 19, 1928  DATE OF PROCEDURE: DATE OF DISCHARGE:  03/04/2011                              OPERATIVE REPORT   PROCEDURE:  Suprapubic cystostomy.  PREOPERATIVE DIAGNOSIS:  Chronic retention.  POSTOPERATIVE DIAGNOSIS:  Chronic retention.  SURGEON:  Excell Seltzer. Annabell Howells, MD  ANESTHESIA:  Spinal.  DRAIN:  A 20-French Foley suprapubic tube.  COMPLICATIONS:  None indicated.  BLOOD LOSS:  Minimal.  COMPLICATIONS:  None.  INDICATIONS:  [name] is an 77 year old white male with chronic retention following a TURP, who has been managed with Foley catheter, but desires suprapubic drainage.  FINDINGS OF PROCEDURE:  He was given Cipro and Ancef and taken to the operating room where spinal anesthetic was induced.  He was placed in lithotomy position.  His lower abdomen and genitalia were prepped with Betadine solution.  He was draped in usual sterile fashion.  A curved Lowsley tractor was placed per urethra into the bladder and the tip was then pressed against the anterior abdominal wall with the patient in Trendelenburg position.  A 1.5 cm incision was made transversely over the tip of the Lowsley as it was pressed against the anterior abdominal wall.  The Bovie was then used to carry the incision down to the tip of the Lowsley, which was passed through the incision.  The jaws were opened.  A 20-French Foley was grasped and pulled back into the bladder. The balloon was inflated and the Lowsley was released.  The catheter initially drained and was irrigated with an aseptic syringe of clear return consistent with intravesical positioning.  The catheter was then secured to skin with 3-0 nylon suture and a dressing was applied.  The patient was taken down  from lithotomy position and moved to the recovery room in stable condition.  There were no complications.  He will be sent home with prescription for Vicodin and instructions to see me in 1 month for a tube change.     Excell Seltzer. Annabell Howells, M.D.     JJW/MEDQ  D:  03/06/2011  T:  03/06/2011  Job:  [id number]  cc:   Samuel Jester Fax: [phone]  Electronically Signed by Bjorn Pippin M.D. on 04/02/2011 10:50:25 AM

## 2011-04-03 NOTE — Procedures (Signed)
CLINICAL HISTORY:  This is a 77 year old gentleman with episode of  lightheadedness followed by syncope.  The patient has a large left arachnoid  cyst and some encephalomalacia in the left frontal region.  Study is being  done to look for the presence of seizures.   PROCEDURE:  The tracing was carried out on a 32-channel digital Cadwell  recorder re-formatted to 16-channel montages with one devoted to EKG.  The  patient was awake during the recording.  The international 10-20 system of  lead placement was used.   DESCRIPTION OF FINDINGS:  Dominant frequency is a 25 to 30 microvolt  activity of 10 hertz that was well regulated and attenuates partially with  eye opening.   Background activity is a mixture of alpha and beta range activity that is  less than 20 microvolts.  Rare theta range activity was seen in the record.  There was no focal slowing.  There was no interictal epileptiform activity  in the form of spikes or sharp waves.  Activating procedures with  intermittent photic stimulation were carried out and failed to induce a  definite driving response.  EKG showed atrial fibrillation with ventricular  response of 72-84 beats per minute.   IMPRESSION:  Normal waking record.    WILLIAM H. Sharene Skeans, M.D.   ZOX:WRUE  D:  05/30/2004 16:44:30  T:  05/30/2004 17:34:12  Job #:  [id number]   cc:   Madaline Guthrie, M.D.  [address]  [place] [address]  Fax: [phone]

## 2011-04-03 NOTE — Consult Note (Signed)
Vincent [name], [name]                       ACCOUNT NO.:  [id number]   MEDICAL RECORD NO.:  [id number]                   PATIENT TYPE:  INP   LOCATION:  4712                                 FACILITY:  [facility]   PHYSICIAN:  Deanna Artis. Sharene Skeans, M.D.           DATE OF BIRTH:  1928/04/17   DATE OF CONSULTATION:  05/30/2004  DATE OF DISCHARGE:                                   CONSULTATION   CHIEF COMPLAINT:  1. Syncope.  2. Left temporal arachnoid cyst.   I was asked by the medical teaching service under Dr. Wyonia Hough to evaluate 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   The patient has had a three-day history of feeling light-headed with  presyncope before he actually had a full syncopal episode while bending  over.   The patient has had increasing pressure in his chest with shortness of  breath, with and without exertion. The patient also had some diaphoresis and  anxiety.   The patient was witnessed by bystanders and did not have any evidence of  tonic, clonic activity or incontinence. The patient has had progressive  exercise tolerance, but no cough, orthopnea, or paroxysmal nocturnal  dyspnea. The patient has occasional paresthesias and numbness of his left  arm and hand, but those symptoms are not present currently.  He has not had  prior history of cerebrovascular accident or TIA. He has had problems with a  right leg length discrepancy and has also had lumbar spondylosis on the  right side that was treated conservatively. Nonetheless, he walks with a  limp.   PAST MEDICAL HISTORY:  Remarkable for atrial fibrillation and was treated  previously with Coumadin. He gave his primary doctor a number of reasons why  he would not take it any more and basically was placed on aspirin.  Unfortunately, he has not had a symptomatic stroke. The patient also has  chronic low back  pain. He had kidney surgery in the 60s and an abscess was  drained following an episode of trauma.   PAST SURGICAL HISTORY:  Kidney surgery and also iridectomy.   CURRENT MEDICATIONS:  Aspirin 81 mg, Percocet p.r.n.   INTOLERANCE:  COUMADIN; he does have true allergy to that.   FAMILY HISTORY:  Mother died of cancer of unknown etiology. Father died of  prostate cancer. He has healthy siblings.   SOCIAL HISTORY:  The patient is single, lives alone. 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 He is a former smoker and does not  currently smoke. He does not use alcohol.   REVIEW OF SYSTEMS:  Remarkable for increased urinary frequency, hesitancy,  and incomplete voiding symptomatic of prostatism. Health system review is  otherwise negative.   PHYSICAL EXAMINATION:  VITAL SIGNS: Temperature 97.7, resting pulse 80,  respirations 20,  blood pressure 145/81. Pulse oximetry 95% on room air.  GENERAL: The patient is an elderly Caucasian man, overweight, in no  distress.  HEENT: The patient has somewhat bulbous nose. No signs of infection in the  head and neck. No cranial or cervical bruits. Poor dental hygiene.  LUNGS: Clear to auscultation.  HEART: Irregularly irregular rhythm. No signs of murmur. Pulses normal.  ABDOMEN: Soft, protuberant. Bowel sounds normal. No hepatosplenomegaly.  EXTREMITIES: Without edema.  SKIN: Lipoma over his back 2 cm in diameter over the T4 region.  NEUROLOGIC: Mental status--the patient is awake, alert, attentive,  appropriate. Mini mental status--28/30. Follows two of three-step commands.  I was able to get him to follow all three. Cranial nerves--round reactive  pupils. His fundi shows mild disc pallor, but disc margins are sharp. Visual  fields are full to double simultaneous stimuli. Extraocular movements are  full and conjugate, symmetrical facial strength and sensation. Air  conduction greater than bone conduction. He was able to  protrude his tongue  and elevate his uvula midline. He has symmetric shoulder shrug. Motor exam--  normal strength, tone, and mass. Good fine motor movements. No pronator  drift. Sensation intact to cold vibration and stereognosis, although there  is a mild stocking neuropathy. Deep tendon reflexes are normal in his upper  extremities, in his left lower extremity, absent in the right lower  extremity, both patella, and Achilles. He had bilateral flexor plantar  responses.   IMPRESSION:  1. Syncope of unknown etiology, 780.2.  I suspect a vagal episode.  2. Benign arachnoid cyst in the middle temporal fossa on the left. This has     been present all this man's life. There is mild chronic, but no signs of     acute mass effect in terms of no edematous changes in the adjacent     cortex. It is draping the middle cerebral artery around it, but that is     of no consequence. This is not the cause of this man's syncope.  3. The patient has some encephalomalacia in the left frontal region in     addition to the cyst. The patient may have had a neonatal stroke (not     related to the cyst) which could be responsible for right leg length     discrepancy that basically involves the femur.  4. The patient has right lumbar spondylosis which is stable.  5. Chronic atrial fibrillation. Stable on aspirin.   I had an opportunity to review the patient's EEG and it was normal. The  patient has not had any significant cardiac dysrhythmias and has had normal  2-D echocardiogram. It is my impression that there is no reason to keep the  patient in the hospital and I would recommend discharge soon. I will see him  in follow up at the request of the teaching service. Finally, I reviewed the  patient's carotid Doppler which showed calcific plaque at the bifurcation  and in the origin of the internal carotid artery without significant  stenosis.  The patient's lipid profile is normal. I do not see other  than atrial fibrillation which presents considerable risk for the patient, but  has been managed for quite some  time. There is no other risk for stroke. I do not believe the syncopal  episode was related to stroke, seizure, or hypoglycemia, and there is no  evidence that it was related to cardiac dysrhythmia. If you have any  questions about this or if I  can be of assistance, do not hesitate to  contact me.                                               Deanna Artis. Sharene Skeans, M.D.    [facility]  D:  05/30/2004  T:  05/30/2004  Job:  [id number]   cc:   Madaline Guthrie, M.D.  [address]  [place] [address]  Fax: [phone]   [address]  [place]  [place] [address]  Fax: [phone]

## 2011-04-03 NOTE — Discharge Summary (Signed)
Vincent [name], Vincent [name]                       ACCOUNT NO.:  [id number]   MEDICAL RECORD NO.:  [id number]                   PATIENT TYPE:  INP   LOCATION:  4712                                 FACILITY:  [facility]   PHYSICIAN:  Madaline Guthrie, M.D.                 DATE OF BIRTH:  04/10/1928   DATE OF ADMISSION:  05/27/2004  DATE OF DISCHARGE:  05/31/2004                                 DISCHARGE SUMMARY   DISCHARGE DIAGNOSES:  1. Syncope.  2. Chronic atrial fibrillation.  3. Benign arachnoid cyst in the left middle temporal fossa.  4. Hypertension.  5. Obesity.  6. Benign hypertrophy of the prostate.  7. Right lumbar spondylosis.   DISCHARGE MEDICATIONS:  1. Hydrochlorothiazide 12.5 mg p.o. daily.  2. Aspirin 81 mg p.o. daily.  3. Protonix 40 mg p.o. daily.  4. Percocet 5/325 mg one tab q.4 h. when needed for back pain.   CONDITION ON DISCHARGE:  The patient is stable.  Vital signs within normal  limits.  Follow up with [facility] on July 25th at 3:30 p.m. and for  adenosine Cardiolite, also with [facility] on July 22nd, Friday, at  7:45 a.m.  Follow up with Dr. Frederico Hamman in outpatient clinic internal medicine  July 29th at 3 p.m.   PROCEDURES:  1. Carotid Doppler study on May 29, 2004.  2. Transthoracic echocardiogram on May 18, 2004.  3. MRI/MRA of the brain without and with contrast on May 29, 2004.  4. CT of the chest on May 27, 2004.   CONSULTATIONS:  1. Dr. Sharene Skeans, neurology.  2. Dr. Eden Emms, [facility].   HISTORY AND PHYSICAL:  A 77 year old man who had a syncopal episode around 1  o'clock in the morning on the day of admission presents to the ER.  The  patient states that he had a 3-day lightheadedness and presyncope before he  actually had a full syncopal episode.  While bending over, the patient had  dizziness and diaphoresis and anxiety.  The patient has also stated that he  has had increased pressure in his chest with shortness of breath  with and  without exertion.  The patient was witnessed by bystanders and did not have  any evidence of tonic-clonic activity or incontinence during syncope.  The  patient has had progressive exercise intolerance but not cough, orthopnea or  paroxysmal nocturnal dyspnea.  The patient has occasional paraesthesia's and  numbness of his left arm and hand but those symptoms are not present  currently.  He has not had prior history of cerebrovascular accident or TIA.  He has had problems with right leg length discrepancy and has also had  lumbar spondylosis on the right side that was treated conservatively.  Nonetheless he walks with a limp.   PHYSICAL EXAMINATION:  VITAL SIGNS:  Temperature 97.7, resting pulse 80,  respirations 20, blood pressure 145/81.  Pulse oximetry 95% on room air.  GENERAL:  The patient is an elderly Caucasian man, overweight, in no  distress.  HEENT:  The patient has somewhat a bulbous nose, no signs of infection in  the head and neck, no cranial or cervical bruits, poor dental hygiene.  LUNGS:  Clear to auscultation.  HEART:  Irregularly irregular rhythm, no signs of murmur.  Pulses normal.  ABDOMEN:  Soft, protuberant.  Bowel sounds normal.  No hepatosplenomegaly.  EXTREMITIES:  Without edema.  SKIN:  Hematoma over his back 2 cm in diameter over the T4 region.  NEUROLOGICAL:  Mental Status:  The patient is awake, alert, attentive, and  appropriate.  Mini Mental Status:  28 out of 30, follows 2 of 3-step  commands.  Cranial Nerves:  Round reactive pupils.  His fundi show mild disk  pallor, but disk margins are sharp.  Visual fields are full to double  simultaneous stimuli.  Extraocular movements are full and conjugate.  Symmetrical facial strength and sensation.  Air conduction greater than bone  conduction.  He was able to protrude his tongue and elevate his uvula  midline.  He has symmetrical shoulder shrug.  Motor Exam:  Normal strength,  tone, and mass.  Good  fine motor movements.  No pronator drift.  Sensation  intact to cold, vibration, and stereoagnosis.  Also there is a mild stocking  neuropathy.  Deep tendon reflexes are normal in upper extremities, left  lower extremity, absent in the right lower extremity both patellae and  Achilles.  He had bilateral flexor planter responses.   HOSPITAL COURSE:  Problem 1.  SYNCOPE OF UNKNOWN ORIGIN:  In the  differential diagnosis we considered TIA versus stroke versus SAH versus MI  versus arrhythmia versus vasovagal syncope versus autonomic neuropathy.  The  patient was placed on IV fluids in the ER.  He was not orthostatic.  Cardiac  enzymes were negative x3.  EKG was negative for acute MI.  The patient was  placed on telemetry for further evaluation.  CT of the head without contrast  was done and showed no evidence of acute stroke.  MRI/MRA of the head was  done and showed mild atrophy and small vessel disease, 3 x 4 cm left middle  cranial fossa arachnoid cyst compressing the anterior aspect of the left  temporal lobe, question seizure focus.  Diffusion images were negative for  acute infarction.  No abnormal intracranial enhancement was noticed.  No  evidence for vertebrobasilar insufficiency, intracranial aneurysm, or  significant proximal stenosis was noticed.  Mild mass effect on the one of  the left middle cerebral branches due to a large arachnoid cyst in the  middle cranial fossa.  Both above studies could not exclude a TIA as the  cause of syncope.  To further investigate for possible causes of TIA, we  ordered carotid Doppler studies and 2-D echo.  Carotid Doppler test showed  mild calcific plaque bifurcation at the origin of ICA, mild calcific plaque  bifurcation in the proximal ICA.  No ICA stenosis.  2-D echocardiogram  showed overall left ventricular systolic function normal, left ventricular ejection fraction estimated in the range between 55% to 65%.  No left  ventricular regional  wall motion abnormalities.  Left ventricular wall  thickness was mildly increased.  There was mild focal basal septal  hypertrophy.  Aortic valve thickness was mildly increased.  There was mildly  reduced aortic valve leaflet excursion.  There was trivial aortic valvular  regurgitation and mild mitral annular calcification.  The left atrium  was  moderately dilated.  There was mild to moderate tricuspid valvular  regurgitation.  The right atrium was mildly to moderately dilated.  No  thrombus was noted.  In conclusion, reconsider syncope as vasovagal versus  secondary to arrhythmia (bradycardia and increased ventricular frequency or  atrial fibrillation).   Problem 2.  CHEST PAIN VERSUS CHEST PRESSURE:  Differential diagnosis  included a MI versus PE versus aortic dissection versus pneumothorax versus  esophageal rupture versus cardiac tamponade.  MI was ruled out by negative  EKG, negative 2-D echo for dyskinesia, negative cardiac enzymes x3.  PE was  excluded by spiral CT of the chest.  Aortic dissection was excluded by  spiral CT of the chest.  Pneumothorax was excluded by physical exam and  esophageal rupture was excluded also by a CT of the chest and x-ray.  An  aortic ascending aneurysm was noted.  It was found to be stable.  No further  treatment was found necessary at this time for ascending aortic aneurysm.  The patient will be followed as outpatient by Dr. Eden Emms for Cardiolite.   Problem 3.  ARRHYTHMIA/SICK SINUS SYNDROME:  The patient has been known with  past medical history of chronic atrial fibrillation.  She is INTOLERANT to  COUMADIN.  During hospitalization the patient's heart rate fluctuated  largely.  At the beginning, she was placed on Lopressor for rate control.  On telemetry monitor she had episodes of bradycardia so Lopressor was  stopped.  The patient will be followed as outpatient by Dr. Eden Emms for  Cardiolite and evaluation for eligibility for a pacemaker  placement.   Problem 4.  HYPERTENSION:  The patient was started on HCTZ.  Terazosin was  also considered for conjunctional treatment of hypertension and benign  prostatic hypertrophy.   Problem 5.  BENIGN PROSTATIC HYPERTROPHY:  The patient complains of urine  leaking and difficulty urinating.  The urine flow was though not obstructed  so no further treatment was considered necessary.  No UTI was noted.   Problem 6.  OBESITY:  The patient was advised to follow a low calorie, low  cholesterol diet.   Problem 7.  HYPERCHOLESTEROLEMIA:  Labs showed total cholesterol of 158,  triglycerides of 91, HDL 43, LDL 97.  At this point, the patient does not  require medicamentosus treatment.  He was advised to keep a low calorie, low  cholesterol diet.   Problem 8.  DIZZINESS:  The patient complains of dizziness accentuated by  movement.  We concluded that this is very probable to be a benign positional vertigo.  On MRI an arachnoid cyst was noted.  Neurology consult considered  no possible relation between dizziness and the arachnoid cyst though there  is a relationship between the cyst and the possible neonatal cerebral  infarction that determined the inconsistency between the right and left  legs.   Problem 9.  ARACHNOID CYST:  It was considered stable and present from  birth, most probably.  No further tests are necessary.   DISCHARGE LABORATORY DATA:  Sodium 140, potassium 4.4, cholesterol 106,  bicarb 27, BUN 17, creatinine 1, glucose 97.  Hemoglobin 15.7, white blood  cells 7.2, platelets 159.      Vanetta Mulders, MD                            Madaline Guthrie, M.D.    DA/MEDQ  D:  06/05/2004  T:  06/06/2004  Job:  [id number]  cc:   [facility], Internal Medicine   Deanna Artis. Sharene Skeans, M.D.  [address]  [address]  [place]  [place] [address]  Fax: [phone]   [address]  [place]  [place] [address]  Fax: [phone]   Charlton Haws, M.D.

## 2011-04-09 ENCOUNTER — Emergency Department (HOSPITAL_COMMUNITY)
Admission: EM | Admit: 2011-04-09 | Discharge: 2011-04-09 | Disposition: A | Discharge status: Home / Self Care | Payer: Medicare Other | Attending: Emergency Medicine | Admitting: Emergency Medicine

## 2011-04-09 ENCOUNTER — Emergency Department (HOSPITAL_COMMUNITY)
Admission: EM | Admit: 2011-04-09 | Discharge: 2011-04-09 | Discharge status: Against Medical Advice | Payer: Medicare Other | Attending: Emergency Medicine | Admitting: Emergency Medicine

## 2011-04-09 DIAGNOSIS — Y846 Urinary catheterization as the cause of abnormal reaction of the patient, or of later complication, without mention of misadventure at the time of the procedure: Secondary | ICD-10-CM | POA: Insufficient documentation

## 2011-04-09 DIAGNOSIS — Z0000 Encounter for general adult medical examination without abnormal findings: Secondary | ICD-10-CM | POA: Insufficient documentation

## 2011-04-09 DIAGNOSIS — N400 Enlarged prostate without lower urinary tract symptoms: Secondary | ICD-10-CM | POA: Insufficient documentation

## 2011-04-09 DIAGNOSIS — I4891 Unspecified atrial fibrillation: Secondary | ICD-10-CM | POA: Insufficient documentation

## 2011-04-09 DIAGNOSIS — T8389XA Other specified complication of genitourinary prosthetic devices, implants and grafts, initial encounter: Secondary | ICD-10-CM | POA: Insufficient documentation

## 2011-04-09 LAB — URINE MICROSCOPIC-ADD ON

## 2011-04-09 LAB — URINALYSIS, ROUTINE W REFLEX MICROSCOPIC
Glucose, UA: NEGATIVE mg/dL
Protein, ur: 30 mg/dL — AB
Specific Gravity, Urine: 1.02 (ref 1.005–1.030)

## 2011-04-14 ENCOUNTER — Ambulatory Visit (INDEPENDENT_AMBULATORY_CARE_PROVIDER_SITE_OTHER): Payer: Medicare Other | Admitting: Urology

## 2011-04-14 DIAGNOSIS — R339 Retention of urine, unspecified: Secondary | ICD-10-CM

## 2011-04-14 DIAGNOSIS — N401 Enlarged prostate with lower urinary tract symptoms: Secondary | ICD-10-CM

## 2011-04-26 ENCOUNTER — Emergency Department (HOSPITAL_COMMUNITY)
Admission: EM | Admit: 2011-04-26 | Discharge: 2011-04-26 | Disposition: A | Discharge status: Home / Self Care | Payer: Medicare Other | Attending: Emergency Medicine | Admitting: Emergency Medicine

## 2011-04-26 DIAGNOSIS — T8389XA Other specified complication of genitourinary prosthetic devices, implants and grafts, initial encounter: Secondary | ICD-10-CM | POA: Insufficient documentation

## 2011-04-26 DIAGNOSIS — Y846 Urinary catheterization as the cause of abnormal reaction of the patient, or of later complication, without mention of misadventure at the time of the procedure: Secondary | ICD-10-CM | POA: Insufficient documentation

## 2011-04-28 ENCOUNTER — Ambulatory Visit (INDEPENDENT_AMBULATORY_CARE_PROVIDER_SITE_OTHER): Payer: Medicare Other | Admitting: Urology

## 2011-04-28 DIAGNOSIS — N402 Nodular prostate without lower urinary tract symptoms: Secondary | ICD-10-CM

## 2011-04-28 DIAGNOSIS — N401 Enlarged prostate with lower urinary tract symptoms: Secondary | ICD-10-CM

## 2011-05-12 ENCOUNTER — Emergency Department (HOSPITAL_COMMUNITY)
Admission: EM | Admit: 2011-05-12 | Discharge: 2011-05-12 | Disposition: A | Discharge status: Home / Self Care | Payer: Medicare Other | Attending: Emergency Medicine | Admitting: Emergency Medicine

## 2011-05-12 DIAGNOSIS — N390 Urinary tract infection, site not specified: Secondary | ICD-10-CM | POA: Insufficient documentation

## 2011-05-12 DIAGNOSIS — R300 Dysuria: Secondary | ICD-10-CM | POA: Insufficient documentation

## 2011-05-12 LAB — URINALYSIS, ROUTINE W REFLEX MICROSCOPIC
Bilirubin Urine: NEGATIVE
Glucose, UA: NEGATIVE mg/dL
Nitrite: POSITIVE — AB
Protein, ur: 100 mg/dL — AB
pH: 7.5 (ref 5.0–8.0)

## 2011-05-14 LAB — URINE CULTURE: Colony Count: >=100000

## 2011-05-15 ENCOUNTER — Emergency Department (HOSPITAL_COMMUNITY)
Admission: EM | Admit: 2011-05-15 | Discharge: 2011-05-15 | Disposition: A | Discharge status: Home / Self Care | Payer: Medicare Other | Attending: Emergency Medicine | Admitting: Emergency Medicine

## 2011-05-15 DIAGNOSIS — T8389XA Other specified complication of genitourinary prosthetic devices, implants and grafts, initial encounter: Secondary | ICD-10-CM | POA: Insufficient documentation

## 2011-05-15 DIAGNOSIS — R338 Other retention of urine: Secondary | ICD-10-CM | POA: Insufficient documentation

## 2011-05-15 DIAGNOSIS — Y849 Medical procedure, unspecified as the cause of abnormal reaction of the patient, or of later complication, without mention of misadventure at the time of the procedure: Secondary | ICD-10-CM | POA: Insufficient documentation

## 2011-05-15 DIAGNOSIS — Z0389 Encounter for observation for other suspected diseases and conditions ruled out: Secondary | ICD-10-CM | POA: Insufficient documentation

## 2011-05-22 ENCOUNTER — Ambulatory Visit (INDEPENDENT_AMBULATORY_CARE_PROVIDER_SITE_OTHER): Payer: Medicare Other | Admitting: Urology

## 2011-05-22 DIAGNOSIS — N401 Enlarged prostate with lower urinary tract symptoms: Secondary | ICD-10-CM

## 2011-05-26 ENCOUNTER — Emergency Department (HOSPITAL_COMMUNITY)
Admission: EM | Admit: 2011-05-26 | Discharge: 2011-05-26 | Disposition: A | Discharge status: Home / Self Care | Payer: Medicare Other | Attending: Emergency Medicine | Admitting: Emergency Medicine

## 2011-05-26 DIAGNOSIS — N390 Urinary tract infection, site not specified: Secondary | ICD-10-CM | POA: Insufficient documentation

## 2011-05-26 DIAGNOSIS — Z87891 Personal history of nicotine dependence: Secondary | ICD-10-CM | POA: Insufficient documentation

## 2011-05-26 HISTORY — DX: Retention of urine, unspecified: R33.9

## 2011-05-26 LAB — URINALYSIS, ROUTINE W REFLEX MICROSCOPIC
Bilirubin Urine: NEGATIVE
Glucose, UA: NEGATIVE mg/dL
Ketones, ur: NEGATIVE mg/dL
Protein, ur: NEGATIVE mg/dL
Urobilinogen, UA: 0.2 mg/dL (ref 0.0–1.0)

## 2011-05-26 MED ORDER — CIPROFLOXACIN HCL 500 MG PO TABS: 500.0000 mg | ORAL_TABLET | Freq: Two times a day (BID) | ORAL | Status: AC

## 2011-05-26 MED ORDER — CIPROFLOXACIN HCL 250 MG PO TABS
500.0000 mg | ORAL_TABLET | Freq: Once | ORAL | Status: AC
  Administered 2011-05-26: 500 mg via ORAL
  Filled 2011-05-26: qty 2

## 2011-05-26 NOTE — ED Notes (Signed)
Pt has foley and c/o burning in urethra. Pt states he feels like the catheter tube is too big. Foley draining yellow urine.

## 2011-05-26 NOTE — ED Provider Notes (Addendum)
History     Chief Complaint  Patient presents with  . Dysuria   HPI Comments: Pt presents with recurrent catheter problem, c/o suprapubic pain and penis pain, described as burning and discomfort that is worse when he sits up; thinks there is a problem with his catheter.   Patient is a 77 y.o. male presenting with abdominal pain. The history is provided by the patient.  Abdominal Pain The primary symptoms of the illness include abdominal pain and dysuria. The primary symptoms of the illness do not include fatigue or diarrhea. The current episode started 2 days ago. The onset of the illness was gradual. The problem has been gradually worsening.  The abdominal pain is located in the suprapubic region and groin. The abdominal pain does not radiate. The abdominal pain is exacerbated by movement.  The dysuria is not associated with hematuria or frequency.  Symptoms associated with the illness do not include diaphoresis, constipation, hematuria, frequency or back pain.    Past Medical History  Diagnosis Date  . Urine retention     History reviewed. No pertinent past surgical history.  History reviewed. No pertinent family history.  History  Substance Use Topics  . Smoking status: Former Games developer  . Smokeless tobacco: Not on file  . Alcohol Use: No      Review of Systems  Constitutional: Negative for diaphoresis and fatigue.  HENT: Negative for congestion, sinus pressure and ear discharge.   Eyes: Negative for discharge.  Respiratory: Negative for cough.   Cardiovascular: Negative for chest pain.  Gastrointestinal: Positive for abdominal pain. Negative for diarrhea and constipation.  Genitourinary: Positive for dysuria. Negative for frequency and hematuria.  Musculoskeletal: Negative for back pain.  Skin: Negative for rash.  Neurological: Negative for seizures and headaches.  Hematological: Negative.   Psychiatric/Behavioral: Negative for hallucinations.    Physical Exam  BP  154/72  Pulse 81  Temp(Src) 97.6 F (36.4 C) (Oral)  Resp 16  Ht 5\' 6"  (1.676 m)  Wt 260 lb (117.935 kg)  BMI 41.97 kg/m2  SpO2 96%  Physical Exam  Constitutional: He is oriented to person, place, and time. He appears well-developed.  HENT:  Head: Normocephalic and atraumatic.  Eyes: Conjunctivae and EOM are normal. No scleral icterus.  Neck: Neck supple. No thyromegaly present.  Cardiovascular: Normal rate and regular rhythm.  Exam reveals no gallop and no friction rub.   No murmur heard. Pulmonary/Chest: No stridor. He has no wheezes. He has no rales. He exhibits no tenderness.  Abdominal: He exhibits no distension. There is tenderness in the suprapubic area. There is no rebound.       Suprapubic catheter in place, no sign of infection  Musculoskeletal: Normal range of motion. He exhibits no edema.  Lymphadenopathy:    He has no cervical adenopathy.  Neurological: He is oriented to person, place, and time. Coordination normal.  Skin: No rash noted. No erythema.  Psychiatric: He has a normal mood and affect. His behavior is normal.    ED Course  Procedures Written by Enos Fling acting as scribe for Dr. Estell Harpin.  0944 Pt reevaluation: Pt resting comfortably. All results reviewed and discussed with pt, questions answered, pt agreeable with plan.    MDM uti from dysuria      Benny Lennert, MD 05/26/11 [phone] The chart was scribed for me under my direct supervision.  I personally performed the history, physical, and medical decision making and all procedures in the evaluation of this patient.Jomarie Longs  Purnell Shoemaker, MD 07/03/11 1229  Benny Lennert, MD 07/03/11 [phone]

## 2011-05-28 LAB — URINE CULTURE
Colony Count: >=100000
Culture  Setup Time: 201207101415

## 2011-06-10 ENCOUNTER — Encounter (HOSPITAL_COMMUNITY): Payer: Self-pay

## 2011-06-10 ENCOUNTER — Emergency Department (HOSPITAL_COMMUNITY)
Admission: EM | Admit: 2011-06-10 | Discharge: 2011-06-10 | Disposition: A | Discharge status: Home / Self Care | Payer: Medicare Other | Attending: Emergency Medicine | Admitting: Emergency Medicine

## 2011-06-10 DIAGNOSIS — N390 Urinary tract infection, site not specified: Secondary | ICD-10-CM

## 2011-06-10 DIAGNOSIS — Z87891 Personal history of nicotine dependence: Secondary | ICD-10-CM | POA: Insufficient documentation

## 2011-06-10 DIAGNOSIS — I4891 Unspecified atrial fibrillation: Secondary | ICD-10-CM | POA: Insufficient documentation

## 2011-06-10 HISTORY — DX: Unspecified atrial fibrillation: I48.91

## 2011-06-10 HISTORY — DX: Cerebral cysts: G93.0

## 2011-06-10 HISTORY — DX: Spondylosis without myelopathy or radiculopathy, lumbar region: M47.816

## 2011-06-10 HISTORY — DX: Benign prostatic hyperplasia without lower urinary tract symptoms: N40.0

## 2011-06-10 LAB — BASIC METABOLIC PANEL
GFR calc Af Amer: >60 mL/min (ref >60)
GFR calc non Af Amer: >60 mL/min (ref >60)
Glucose, Bld: 135 mg/dL — ABNORMAL HIGH (ref 70–99)
Potassium: 4.6 mEq/L (ref 3.5–5.1)
Sodium: 137 mEq/L (ref 135–145)

## 2011-06-10 LAB — CBC
MCH: 30.6 pg (ref 26.0–34.0)
Platelets: 144 10*3/uL — ABNORMAL LOW (ref 150–400)
RBC: 5.23 MIL/uL (ref 4.22–5.81)

## 2011-06-10 LAB — DIFFERENTIAL
Basophils Relative: 0 % (ref 0–1)
Eosinophils Absolute: 0.2 10*3/uL (ref 0.0–0.7)
Lymphs Abs: 1.4 10*3/uL (ref 0.7–4.0)
Neutro Abs: 4.9 10*3/uL (ref 1.7–7.7)
Neutrophils Relative %: 67 % (ref 43–77)

## 2011-06-10 LAB — URINE MICROSCOPIC-ADD ON

## 2011-06-10 LAB — URINALYSIS, ROUTINE W REFLEX MICROSCOPIC
Ketones, ur: NEGATIVE mg/dL
Protein, ur: 30 mg/dL — AB
Specific Gravity, Urine: >1.03 — ABNORMAL HIGH (ref 1.005–1.030)
Urobilinogen, UA: 0.2 mg/dL (ref 0.0–1.0)

## 2011-06-10 MED ORDER — CEPHALEXIN 500 MG PO CAPS: 500.0000 mg | ORAL_CAPSULE | Freq: Four times a day (QID) | ORAL | Status: AC

## 2011-06-10 NOTE — ED Notes (Signed)
Patient is complaining of pain when I was getting the urine out of cath. States it's like a burning sensation.

## 2011-06-10 NOTE — ED Notes (Signed)
Has indwelling cath, pt states he's been having penis pain for 2 days. No retention.

## 2011-06-10 NOTE — ED Provider Notes (Signed)
History     Chief Complaint  Patient presents with  . Dysuria   Patient is a 77 y.o. male presenting with dysuria.  Dysuria    Pt was seen at 0520.  Per pt, c/o gradual onset and persistence of constant lower abd "pain" x2 days.  Pt describes the pain as "pressure" and "uncomfortable."  Endorses he "usually has a urine infection when I feel like this."  Denies leakage around suprapubic cath site, no rash, no fevers, no flank pain, no N/V/D, no CP/SOB.    Past Medical History  Diagnosis Date  . Urine retention   . Atrial fibrillation   . Lumbar spondylosis   . Prostate hypertrophy   . Arachnoid cyst     Past Surgical History  Procedure Date  . Suprapubic catheter placement   . Transurethral resection of prostate   . Renal abscess drainage     History reviewed. No pertinent family history.  History  Substance Use Topics  . Smoking status: Former Games developer  . Smokeless tobacco: Not on file  . Alcohol Use: No      Review of Systems  Genitourinary: Positive for dysuria.  ROS: Statement: All systems negative except as marked or noted in the HPI; Constitutional: Negative for fever and chills. ; ; Eyes: Negative for eye pain and discharge. ; ; ENMT: Negative for ear pain, hoarseness, nasal congestion, sinus pressure and sore throat. ; ; Cardiovascular: Negative for chest pain, palpitations, diaphoresis, dyspnea and peripheral edema. ; ; Respiratory: Negative for cough, wheezing and stridor. ; ; Gastrointestinal: Negative for nausea, vomiting, diarrhea and abdominal pain. ; ; Genitourinary: Negative for dysuria, flank pain and hematuria. ; ; Musculoskeletal: Negative for back pain and neck pain. ; ; Skin: Negative for rash and skin lesion. ; ; Neuro: Negative for headache, lightheadedness and neck stiffness. ;     Physical Exam  BP 155/83  Pulse 66  Temp(Src) 97.6 F (36.4 C) (Oral)  Resp 20  Ht 5\' 6"  (1.676 m)  Wt 260 lb (117.935 kg)  BMI 41.97 kg/m2  SpO2 95%  Physical  Exam 0530: Physical examination:  Nursing notes reviewed; Vital signs and O2 SAT reviewed;  Constitutional: Well developed, Well nourished, Well hydrated, In no acute distress; Head:  Normocephalic, atraumatic; Eyes: EOMI, PERRL, No scleral icterus; ENMT: Mouth and pharynx normal, Mucous membranes moist; Neck: Supple, Full range of motion, No lymphadenopathy; Cardiovascular: Regular rate and rhythm, No murmur, rub, or gallop; Respiratory: Breath sounds clear & equal bilaterally, No rales, rhonchi, wheezes, or rub, Normal respiratory effort/excursion; Chest: Nontender, Movement normal; Abdomen: Soft, Nontender, Nondistended, Normal bowel sounds; Genitourinary: No CVA tenderness, +suprapubic catheter in place without surrounding abd wall tenderness, erythema, edema or drainage; Extremities: Pulses normal, No tenderness, No edema, No calf edema or asymmetry.; Neuro: AA&Ox3, Major CN grossly intact.  No gross focal motor or sensory deficits in extremities. Gait steady.; Skin: Color normal, Warm, Dry  ED Course  Procedures  MDM MDM Reviewed: previous chart, vitals and nursing note Interpretation: labs   Results for orders placed during the hospital encounter of 06/10/11  URINALYSIS, ROUTINE W REFLEX MICROSCOPIC      Component Value Range   Color, Urine YELLOW  YELLOW    Appearance CLOUDY (*) CLEAR    Specific Gravity, Urine >1.030 (*) 1.005 - 1.030    pH 5.5  5.0 - 8.0    Glucose, UA NEGATIVE  NEGATIVE (mg/dL)   Hgb urine dipstick LARGE (*) NEGATIVE    Bilirubin Urine SMALL (*)  NEGATIVE    Ketones, ur NEGATIVE  NEGATIVE (mg/dL)   Protein, ur 30 (*) NEGATIVE (mg/dL)   Urobilinogen, UA 0.2  0.0 - 1.0 (mg/dL)   Nitrite POSITIVE (*) NEGATIVE    Leukocytes, UA MODERATE (*) NEGATIVE   CBC      Component Value Range   WBC 7.3  4.0 - 10.5 (K/uL)   RBC 5.23  4.22 - 5.81 (MIL/uL)   Hemoglobin 16.0  13.0 - 17.0 (g/dL)   HCT 57.8  46.9 - 62.9 (%)   MCV 92.0  78.0 - 100.0 (fL)   MCH 30.6  26.0 -  34.0 (pg)   MCHC 33.3  30.0 - 36.0 (g/dL)   RDW 52.8  41.3 - 24.4 (%)   Platelets 144 (*) 150 - 400 (K/uL)  DIFFERENTIAL      Component Value Range   Neutrophils Relative 67  43 - 77 (%)   Neutro Abs 4.9  1.7 - 7.7 (K/uL)   Lymphocytes Relative 19  12 - 46 (%)   Lymphs Abs 1.4  0.7 - 4.0 (K/uL)   Monocytes Relative 11  3 - 12 (%)   Monocytes Absolute 0.8  0.1 - 1.0 (K/uL)   Eosinophils Relative 3  0 - 5 (%)   Eosinophils Absolute 0.2  0.0 - 0.7 (K/uL)   Basophils Relative 0  0 - 1 (%)   Basophils Absolute 0.0  0.0 - 0.1 (K/uL)  BASIC METABOLIC PANEL      Component Value Range   Sodium 137  135 - 145 (mEq/L)   Potassium 4.6  3.5 - 5.1 (mEq/L)   Chloride 100  96 - 112 (mEq/L)   CO2 26  19 - 32 (mEq/L)   Glucose, Bld 135 (*) 70 - 99 (mg/dL)   BUN 23  6 - 23 (mg/dL)   Creatinine, Ser 0.10  0.50 - 1.35 (mg/dL)   Calcium 9.8  8.4 - 27.2 (mg/dL)   GFR calc non Af Amer >60  >60 (mL/min)   GFR calc Af Amer >60  >60 (mL/min)  LACTIC ACID, PLASMA      Component Value Range   Lactic Acid, Venous 1.1  0.5 - 2.2 (mmol/L)  URINE MICROSCOPIC-ADD ON      Component Value Range   WBC, UA 11-20  <3 (WBC/hpf)   RBC / HPF TOO NUMEROUS TO COUNT  <3 (RBC/hpf)   Bacteria, UA MANY (*) RARE     +UTI, UC pending.  WBC and LA normal.  VSS while in ED.  4:35 PM:  Pt wants to go home now.  Pt is dressed and walking around ED without distress.  Pt states he has not been f/u with his PMD or Uro MD after being eval in ED for UTI's and "just comes to the ER for it."  Pt strongly encouraged to do so within the next week.  Dx testing today with UTI (UC pending), afebrile, WBC, BUN/Cr, and lactic acid normal.  Dx testing d/w pt.  Questions answered.  Verb understanding, agreeable to d/c home with outpt f/u.    Donnabelle Blanchard Allison Quarry, DO 06/10/11 1636

## 2011-06-13 LAB — URINE CULTURE

## 2011-06-14 NOTE — ED Notes (Signed)
+   urine culture. Chart sent to EDP office for review 

## 2011-06-21 NOTE — ED Notes (Signed)
Patient notified of positive urine culture report. RX : Ampicillin 500 mg PO BID x 10 days called to [facility] in [place].

## 2011-06-23 ENCOUNTER — Encounter (HOSPITAL_COMMUNITY): Payer: Self-pay | Admitting: *Deleted

## 2011-06-23 ENCOUNTER — Emergency Department (HOSPITAL_COMMUNITY)
Admission: EM | Admit: 2011-06-23 | Discharge: 2011-06-23 | Disposition: A | Discharge status: Home / Self Care | Payer: Medicare Other | Attending: Emergency Medicine | Admitting: Emergency Medicine

## 2011-06-23 DIAGNOSIS — Z87891 Personal history of nicotine dependence: Secondary | ICD-10-CM | POA: Insufficient documentation

## 2011-06-23 DIAGNOSIS — R300 Dysuria: Secondary | ICD-10-CM | POA: Insufficient documentation

## 2011-06-23 DIAGNOSIS — R109 Unspecified abdominal pain: Secondary | ICD-10-CM | POA: Insufficient documentation

## 2011-06-23 LAB — BASIC METABOLIC PANEL
Calcium: 9.8 mg/dL (ref 8.4–10.5)
GFR calc Af Amer: >60 mL/min (ref >60)
GFR calc non Af Amer: >60 mL/min (ref >60)
Glucose, Bld: 132 mg/dL — ABNORMAL HIGH (ref 70–99)
Potassium: 4.5 mEq/L (ref 3.5–5.1)
Sodium: 137 mEq/L (ref 135–145)

## 2011-06-23 LAB — DIFFERENTIAL
Basophils Absolute: 0 10*3/uL (ref 0.0–0.1)
Basophils Relative: 0 % (ref 0–1)
Eosinophils Absolute: 0.2 10*3/uL (ref 0.0–0.7)
Eosinophils Relative: 2 % (ref 0–5)
Lymphs Abs: 1.2 10*3/uL (ref 0.7–4.0)
Neutrophils Relative %: 68 % (ref 43–77)

## 2011-06-23 LAB — URINE MICROSCOPIC-ADD ON

## 2011-06-23 LAB — URINALYSIS, ROUTINE W REFLEX MICROSCOPIC
Bilirubin Urine: NEGATIVE
Ketones, ur: NEGATIVE mg/dL
Nitrite: NEGATIVE
Protein, ur: 30 mg/dL — AB
Specific Gravity, Urine: >1.03 — ABNORMAL HIGH (ref 1.005–1.030)
Urobilinogen, UA: 0.2 mg/dL (ref 0.0–1.0)

## 2011-06-23 LAB — CBC
MCH: 31.1 pg (ref 26.0–34.0)
MCHC: 33.7 g/dL (ref 30.0–36.0)
MCV: 92.2 fL (ref 78.0–100.0)
Platelets: 138 10*3/uL — ABNORMAL LOW (ref 150–400)
RDW: 13.8 % (ref 11.5–15.5)

## 2011-06-23 MED ORDER — OXYCODONE-ACETAMINOPHEN 5-325 MG PO TABS: 1.0000 | ORAL_TABLET | Freq: Four times a day (QID) | ORAL | Status: AC | PRN

## 2011-06-23 MED ORDER — OXYCODONE-ACETAMINOPHEN 5-325 MG PO TABS
1.0000 | ORAL_TABLET | Freq: Once | ORAL | Status: AC
  Administered 2011-06-23: 1 via ORAL
  Filled 2011-06-23: qty 1

## 2011-06-23 MED ORDER — IBUPROFEN 600 MG PO TABS: 600.0000 mg | ORAL_TABLET | Freq: Four times a day (QID) | ORAL | Status: AC | PRN

## 2011-06-23 MED ORDER — OXYCODONE-ACETAMINOPHEN 5-325 MG PO TABS: 2.0000 | ORAL_TABLET | Freq: Once | ORAL | Status: DC

## 2011-06-23 NOTE — ED Notes (Signed)
Pt c/o pain to penis; pt states someone called him and told him he had a UTI; pt has suprapubic catheter

## 2011-06-23 NOTE — ED Provider Notes (Signed)
History     CSN: [id number] Arrival date & time: 06/23/2011  5:05 AM  Chief Complaint  Patient presents with  . Abdominal Pain   HPI Comments: Pt has frequent UTI's, SP catheter placement an unknown time ago per pt - now having recurrent SP ttp.  Denies d/c or leakage from the SP catheter site.  No fevers, vomiting or other c/o.  Patient is a 77 y.o. male presenting with abdominal pain. The history is provided by the patient and medical records.  Abdominal Pain The primary symptoms of the illness include abdominal pain. The primary symptoms of the illness do not include fever, fatigue, shortness of breath, nausea, vomiting, diarrhea, hematemesis, hematochezia or dysuria. The current episode started yesterday. The onset of the illness was gradual. The problem has not changed since onset. The abdominal pain began yesterday. The pain came on gradually. The abdominal pain has been unchanged since its onset. The abdominal pain is located in the suprapubic region. The abdominal pain does not radiate. The abdominal pain is relieved by nothing. Exacerbated by: palpation.  Associated with: SP catheter. The patient has not had a change in bowel habit. Risk factors for an acute abdominal problem include being elderly. Symptoms associated with the illness do not include chills, anorexia, diaphoresis, constipation, hematuria or back pain. Significant associated medical issues do not include diabetes. Associated medical issues comments: history of TURP and SP catheter placement for retention.    Past Medical History  Diagnosis Date  . Urine retention   . Atrial fibrillation   . Lumbar spondylosis   . Prostate hypertrophy   . Arachnoid cyst     Past Surgical History  Procedure Date  . Suprapubic catheter placement   . Transurethral resection of prostate   . Renal abscess drainage     History reviewed. No pertinent family history.  History  Substance Use Topics  . Smoking status: Former Games developer    . Smokeless tobacco: Not on file  . Alcohol Use: No      Review of Systems  Constitutional: Negative for fever, chills, diaphoresis and fatigue.  HENT: Negative for neck pain.   Eyes: Negative for redness.  Respiratory: Negative for shortness of breath.   Cardiovascular: Negative for chest pain and leg swelling.  Gastrointestinal: Positive for abdominal pain. Negative for nausea, vomiting, diarrhea, constipation, hematochezia, anorexia and hematemesis.  Genitourinary: Positive for difficulty urinating. Negative for dysuria, hematuria, discharge, penile swelling, scrotal swelling, penile pain and testicular pain.  Musculoskeletal: Negative for back pain.  Skin: Negative for rash.  Neurological: Negative for headaches.  Hematological: Negative for adenopathy.    Physical Exam  BP 137/92  Pulse 78  Temp(Src) 96 F (35.6 C) (Oral)  Resp 20  Ht 5\' 6"  (1.676 m)  Wt 260 lb (117.935 kg)  BMI 41.97 kg/m2  SpO2 97%  Physical Exam  Nursing note and vitals reviewed. Constitutional: He appears well-developed and well-nourished. No distress.  HENT:  Head: Normocephalic and atraumatic.  Mouth/Throat: Oropharynx is clear and moist. No oropharyngeal exudate.  Eyes: Conjunctivae and EOM are normal. Pupils are equal, round, and reactive to light. Right eye exhibits no discharge. Left eye exhibits no discharge. No scleral icterus.  Neck: Normal range of motion. Neck supple. No JVD present. No thyromegaly present.  Cardiovascular: Normal rate, normal heart sounds and intact distal pulses.  An irregularly irregular rhythm present. Exam reveals no gallop and no friction rub.   No murmur heard. Pulmonary/Chest: Effort normal and breath sounds normal. No respiratory  distress. He has no wheezes. He has no rales.  Abdominal: Soft. Bowel sounds are normal. He exhibits no distension and no mass. There is tenderness ( ttp in the SP area around the catheter site.  There is no drainage or purulence or  indruration around this site). Hernia confirmed negative in the right inguinal area and confirmed negative in the left inguinal area.  Genitourinary: Testes normal and penis normal. Right testis shows no mass, no swelling and no tenderness. Left testis shows no mass, no swelling and no tenderness. Uncircumcised. No phimosis, paraphimosis, penile erythema or penile tenderness. No discharge found.  Musculoskeletal: Normal range of motion. He exhibits no edema and no tenderness.  Lymphadenopathy:    He has no cervical adenopathy.       Right: No inguinal adenopathy present.       Left: No inguinal adenopathy present.  Neurological: He is alert. Coordination normal.  Skin: Skin is warm and dry. No rash noted. No erythema.  Psychiatric: He has a normal mood and affect. His behavior is normal.    ED Course  Procedures  MDM Pt has normal appearing genitalia, has ttp around the catheter site but has no other signs of infection - urine from cath bag checked and test pending.  He is unsure of the last time it was changed out - he has recently been seen in ED for UTI and treated with amox after culture obtained.  VS normal  Laboratory evaluation shows normal electrolytes, renal function, blood counts other than a slightly low platelets at 138, and a urinalysis with 21-50 red blood cells, 7-10 white blood cells and only a few bacteria.  Will give patient pain medication and encourage him to followup with his urologist for further evaluation of his abnormal pain around his suprapubic catheter. It does not appear to be infected at this time.   Vida Roller, MD 06/23/11 [phone]

## 2011-06-23 NOTE — ED Notes (Signed)
Pt self ambulated out with a steady gait stating no needs 

## 2011-06-24 LAB — URINE CULTURE
Colony Count: NO GROWTH
Culture  Setup Time: 201208071158

## 2011-06-26 ENCOUNTER — Ambulatory Visit (INDEPENDENT_AMBULATORY_CARE_PROVIDER_SITE_OTHER): Payer: Medicare Other | Admitting: Urology

## 2011-06-26 DIAGNOSIS — N401 Enlarged prostate with lower urinary tract symptoms: Secondary | ICD-10-CM

## 2011-07-02 ENCOUNTER — Emergency Department (HOSPITAL_COMMUNITY)
Admission: EM | Admit: 2011-07-02 | Discharge: 2011-07-02 | Disposition: A | Discharge status: Home / Self Care | Payer: Medicare Other | Attending: Emergency Medicine | Admitting: Emergency Medicine

## 2011-07-02 ENCOUNTER — Encounter (HOSPITAL_COMMUNITY): Payer: Self-pay | Admitting: *Deleted

## 2011-07-02 DIAGNOSIS — I4891 Unspecified atrial fibrillation: Secondary | ICD-10-CM | POA: Insufficient documentation

## 2011-07-02 DIAGNOSIS — I10 Essential (primary) hypertension: Secondary | ICD-10-CM | POA: Insufficient documentation

## 2011-07-02 DIAGNOSIS — Z87891 Personal history of nicotine dependence: Secondary | ICD-10-CM | POA: Insufficient documentation

## 2011-07-02 DIAGNOSIS — N390 Urinary tract infection, site not specified: Secondary | ICD-10-CM | POA: Insufficient documentation

## 2011-07-02 LAB — URINALYSIS, ROUTINE W REFLEX MICROSCOPIC
Nitrite: POSITIVE — AB
Protein, ur: >300 mg/dL — AB
pH: 5 (ref 5.0–8.0)

## 2011-07-02 LAB — URINE MICROSCOPIC-ADD ON

## 2011-07-02 MED ORDER — CEPHALEXIN 500 MG PO CAPS: 500.0000 mg | ORAL_CAPSULE | Freq: Four times a day (QID) | ORAL | Status: AC

## 2011-07-02 NOTE — ED Notes (Signed)
C/o pain with urination that started two days ago.

## 2011-07-02 NOTE — ED Notes (Signed)
Pt has a suprapubic catheter and states he has a urinary tract infection coming on because he has a burning sensation in his penis.

## 2011-07-02 NOTE — ED Provider Notes (Signed)
History     CSN: [id number] Arrival date & time: 07/02/2011  7:59 AM  Chief Complaint  Patient presents with  . painful urination    HPI Comments: Patient with a history of frequent urinary tract infections since obtaining a foley catheter an unknown time ago due to bph and retention.  Patient is a 77 y.o. male presenting with dysuria. The history is provided by the patient.  Dysuria  This is a recurrent problem. The current episode started 2 days ago. The problem occurs every urination. The problem has not changed since onset.The quality of the pain is described as burning. The pain is at a severity of 3/10. The pain is mild. There has been no fever. He is not sexually active. There is no history of pyelonephritis. Pertinent negatives include no chills, no nausea, no vomiting, no discharge, no hematuria, no urgency and no flank pain. He has tried nothing for the symptoms. His past medical history does not include kidney stones.    Past Medical History  Diagnosis Date  . Urine retention   . Atrial fibrillation   . Lumbar spondylosis   . Prostate hypertrophy   . Arachnoid cyst     Past Surgical History  Procedure Date  . Suprapubic catheter placement   . Transurethral resection of prostate   . Renal abscess drainage     No family history on file.  History  Substance Use Topics  . Smoking status: Former Games developer  . Smokeless tobacco: Not on file  . Alcohol Use: No      Review of Systems  Constitutional: Negative for fever and chills.  HENT: Negative for congestion, sore throat and neck pain.   Eyes: Negative.   Respiratory: Negative for chest tightness and shortness of breath.   Cardiovascular: Negative for chest pain.  Gastrointestinal: Negative for nausea, vomiting and abdominal pain.  Genitourinary: Positive for dysuria. Negative for urgency, hematuria, flank pain, penile pain and testicular pain.  Musculoskeletal: Negative for joint swelling and arthralgias.  Skin:  Negative.  Negative for rash and wound.  Neurological: Negative for dizziness, weakness, light-headedness, numbness and headaches.  Hematological: Negative.   Psychiatric/Behavioral: Negative.     Physical Exam  BP 173/106  Pulse 85  Temp(Src) 97.5 F (36.4 C) (Oral)  Resp 22  Ht 5\' 6"  (1.676 m)  Wt 260 lb (117.935 kg)  BMI 41.97 kg/m2  SpO2 97%  Physical Exam  Vitals reviewed. Constitutional: He is oriented to person, place, and time. He appears well-developed and well-nourished.       bp elevated.  HENT:  Head: Normocephalic and atraumatic.  Eyes: Conjunctivae are normal.  Neck: Normal range of motion.  Cardiovascular: Normal rate, regular rhythm, normal heart sounds and intact distal pulses.   Pulmonary/Chest: Effort normal and breath sounds normal. He has no wheezes.  Abdominal: Soft. Bowel sounds are normal. There is no tenderness.  Genitourinary: Testes normal. Uncircumcised. No discharge found.       Suprapubic cath site appears healthy with no drainage or erythema.  Musculoskeletal: Normal range of motion.  Neurological: He is alert and oriented to person, place, and time.  Skin: Skin is warm and dry.  Psychiatric: He has a normal mood and affect.    ED Course  Procedures  MDM Urinalysis results reviewed.  Orange color - pt reports did take a pyridium 3 days ago.  UTI.  Previous  Labs and ed visits reviewed.  Medical screening examination/treatment/procedure(s) were performed by non-physician practitioner and as supervising physician  I was immediately available for consultation/collaboration.    Candis Musa, PA 07/02/11 0900  Candis Musa, PA 07/02/11 1610  Suzi Roots, MD 07/11/11 [phone]

## 2011-07-02 NOTE — ED Notes (Signed)
Advised patient that doctor would like for him to get into a gown.  Patient refused - stated "he does not need to get undress for painful urine".

## 2011-07-03 LAB — URINE CULTURE: Colony Count: >=100000

## 2011-07-13 ENCOUNTER — Encounter (HOSPITAL_COMMUNITY): Payer: Self-pay | Admitting: Emergency Medicine

## 2011-07-13 ENCOUNTER — Emergency Department (HOSPITAL_COMMUNITY)
Admission: EM | Admit: 2011-07-13 | Discharge: 2011-07-13 | Disposition: A | Discharge status: Home / Self Care | Payer: Medicare Other | Attending: Emergency Medicine | Admitting: Emergency Medicine

## 2011-07-13 DIAGNOSIS — M47817 Spondylosis without myelopathy or radiculopathy, lumbosacral region: Secondary | ICD-10-CM | POA: Insufficient documentation

## 2011-07-13 DIAGNOSIS — R339 Retention of urine, unspecified: Secondary | ICD-10-CM | POA: Insufficient documentation

## 2011-07-13 DIAGNOSIS — R300 Dysuria: Secondary | ICD-10-CM | POA: Insufficient documentation

## 2011-07-13 DIAGNOSIS — Z79899 Other long term (current) drug therapy: Secondary | ICD-10-CM | POA: Insufficient documentation

## 2011-07-13 DIAGNOSIS — I4891 Unspecified atrial fibrillation: Secondary | ICD-10-CM | POA: Insufficient documentation

## 2011-07-13 DIAGNOSIS — R319 Hematuria, unspecified: Secondary | ICD-10-CM | POA: Insufficient documentation

## 2011-07-13 DIAGNOSIS — Z87891 Personal history of nicotine dependence: Secondary | ICD-10-CM | POA: Insufficient documentation

## 2011-07-13 LAB — URINE MICROSCOPIC-ADD ON

## 2011-07-13 LAB — URINALYSIS, ROUTINE W REFLEX MICROSCOPIC
Nitrite: NEGATIVE
Protein, ur: 30 mg/dL — AB
Specific Gravity, Urine: 1.025 (ref 1.005–1.030)
Urobilinogen, UA: 1 mg/dL (ref 0.0–1.0)

## 2011-07-13 MED ORDER — CIPROFLOXACIN HCL 500 MG PO TABS: 500.0000 mg | ORAL_TABLET | Freq: Two times a day (BID) | ORAL | Status: AC

## 2011-07-13 NOTE — ED Notes (Signed)
Pt c/o burning to his penis since last night. Pt has suprapubic cath.

## 2011-07-13 NOTE — ED Provider Notes (Signed)
History   Chart scribed for Shelda Jakes, MD by Enos Fling; the patient was seen in room APA07/APA07; this patient's care was started at 11:34 AM.    CSN: [id number] Arrival date & time: 07/13/2011 10:35 AM  Chief Complaint  Patient presents with  . Dysuria   HPI Vincent [name] is a 77 y.o. male who presents to the Emergency Department complaining of burning with urination since 4am this morning. Pt with catheter but states it is working well and does not need to be changed. Pt seen in ED multiple times for same, states usually has UTI with pain like this. No other complaints. No abd pain, back pain, f/c, or n/v/d.   Past Medical History  Diagnosis Date  . Urine retention   . Atrial fibrillation   . Lumbar spondylosis   . Prostate hypertrophy   . Arachnoid cyst     Past Surgical History  Procedure Date  . Suprapubic catheter placement   . Transurethral resection of prostate   . Renal abscess drainage     History reviewed. No pertinent family history.  History  Substance Use Topics  . Smoking status: Former Games developer  . Smokeless tobacco: Not on file  . Alcohol Use: No   Previous Medications   ASPIRIN 81 MG TABLET    Take 81 mg by mouth as needed. For pain   BUDESONIDE-FORMOTEROL (SYMBICORT) 80-4.5 MCG/ACT INHALER    Inhale 2 puffs into the lungs 2 (two) times daily as needed. Shortness of Breath    FUROSEMIDE (LASIX) 20 MG TABLET    Take 20-40 mg by mouth 2 (two) times daily as needed. For swelling    OXYCODONE-ACETAMINOPHEN (PERCOCET) 5-325 MG PER TABLET    Take 1 tablet by mouth every 6 (six) hours as needed. For pain      Allergies as of 07/13/2011  . (No Known Allergies)      Review of Systems  Constitutional: Negative for fever and chills.  HENT: Negative for congestion and rhinorrhea.   Eyes: Negative for redness.  Respiratory: Negative for cough and shortness of breath.   Cardiovascular: Negative for chest pain and leg swelling.    Gastrointestinal: Negative for nausea, vomiting, abdominal pain and diarrhea.  Genitourinary: Positive for dysuria. Negative for hematuria and flank pain.  Musculoskeletal: Negative for back pain.  Skin: Negative for rash.  Neurological: Negative for headaches.    Physical Exam  BP 141/76  Pulse 80  Temp(Src) 97.6 F (36.4 C) (Oral)  Resp 21  Ht 5\' 6"  (1.676 m)  Wt 260 lb (117.935 kg)  BMI 41.97 kg/m2  SpO2 97%  Physical Exam  Nursing note and vitals reviewed. Constitutional: He is oriented to person, place, and time. He appears well-developed and well-nourished. No distress.  HENT:  Head: Normocephalic and atraumatic.  Eyes: Conjunctivae and EOM are normal. Pupils are equal, round, and reactive to light.  Neck: Normal range of motion. Neck supple.  Cardiovascular: Normal rate, regular rhythm and normal heart sounds.   Pulmonary/Chest: Effort normal and breath sounds normal. He has no wheezes. He exhibits no tenderness.  Abdominal: Soft. Bowel sounds are normal. There is no tenderness.  Musculoskeletal: Normal range of motion. He exhibits no edema and no tenderness.  Lymphadenopathy:    He has no cervical adenopathy.  Neurological: He is alert and oriented to person, place, and time. He has normal strength. No cranial nerve deficit or sensory deficit.  Skin: Skin is warm and dry. No rash noted.  Psychiatric:  He has a normal mood and affect.    ED Course  Procedures  OTHER DATA REVIEWED: Nursing notes and vital signs reviewed. Prior records reviewed.   LABS / RADIOLOGY: Results for orders placed during the hospital encounter of 07/13/11  URINALYSIS, ROUTINE W REFLEX MICROSCOPIC      Component Value Range   Color, Urine YELLOW  YELLOW    Appearance CLEAR  CLEAR    Specific Gravity, Urine 1.025  1.005 - 1.030    pH 6.5  5.0 - 8.0    Glucose, UA NEGATIVE  NEGATIVE (mg/dL)   Hgb urine dipstick LARGE (*) NEGATIVE    Bilirubin Urine NEGATIVE  NEGATIVE    Ketones, ur  NEGATIVE  NEGATIVE (mg/dL)   Protein, ur 30 (*) NEGATIVE (mg/dL)   Urobilinogen, UA 1.0  0.0 - 1.0 (mg/dL)   Nitrite NEGATIVE  NEGATIVE    Leukocytes, UA MODERATE (*) NEGATIVE   URINE MICROSCOPIC-ADD ON      Component Value Range   Squamous Epithelial / LPF RARE  RARE    WBC, UA 7-10  <3 (WBC/hpf)   RBC / HPF TOO NUMEROUS TO COUNT  <3 (RBC/hpf)   Bacteria, UA MANY (*) RARE    Urine culture pending   MDM: HEMATURIA SEEN FREQUENTLY FOR THIS CULTURE PENDING WILL TREAT WITH CIPRO IN CASE INFECTED. ALWAYS REQUESTED TO FOLLOW UP WITH UROLOGY HE WILL NOT DO THAT.   IMPRESSION: 1. Hematuria      PLAN: discharge All results reviewed and discussed with pt, questions answered, pt agreeable with plan.   CONDITION ON DISCHARGE: stable   DISCHARGE MEDICATIONS: New Prescriptions   CIPROFLOXACIN (CIPRO) 500 MG TABLET    Take 1 tablet (500 mg total) by mouth every 12 (twelve) hours.     SCRIBE ATTESTATION:  I personally performed the services described in this documentation, which was scribed in my presence. The recorded information has been reviewed and considered. Shelda Jakes, MD         Shelda Jakes, MD 07/13/11 1250

## 2011-07-17 LAB — URINE CULTURE
Colony Count: >=100000
Culture  Setup Time: 201208280054

## 2011-07-18 NOTE — ED Notes (Signed)
+   urine culture, resistant to prescribed Cipro. Chart sent to EDP office for review

## 2011-07-24 ENCOUNTER — Ambulatory Visit (INDEPENDENT_AMBULATORY_CARE_PROVIDER_SITE_OTHER): Payer: Medicare Other | Admitting: Urology

## 2011-07-24 DIAGNOSIS — N401 Enlarged prostate with lower urinary tract symptoms: Secondary | ICD-10-CM

## 2011-07-24 NOTE — ED Notes (Signed)
Have patient follow-up with urologist for treatment of pseudomonal urinary tract infection.There are no readily available po antibiotics. Per Rhea Bleacher.

## 2011-08-11 ENCOUNTER — Encounter (HOSPITAL_COMMUNITY): Payer: Self-pay

## 2011-08-11 ENCOUNTER — Emergency Department (HOSPITAL_COMMUNITY)
Admission: EM | Admit: 2011-08-11 | Discharge: 2011-08-11 | Disposition: A | Discharge status: Home / Self Care | Payer: Medicare Other | Attending: Emergency Medicine | Admitting: Emergency Medicine

## 2011-08-11 DIAGNOSIS — T8389XA Other specified complication of genitourinary prosthetic devices, implants and grafts, initial encounter: Secondary | ICD-10-CM | POA: Insufficient documentation

## 2011-08-11 DIAGNOSIS — I4891 Unspecified atrial fibrillation: Secondary | ICD-10-CM | POA: Insufficient documentation

## 2011-08-11 DIAGNOSIS — Z87891 Personal history of nicotine dependence: Secondary | ICD-10-CM | POA: Insufficient documentation

## 2011-08-11 DIAGNOSIS — T83091A Other mechanical complication of indwelling urethral catheter, initial encounter: Secondary | ICD-10-CM

## 2011-08-11 DIAGNOSIS — Y849 Medical procedure, unspecified as the cause of abnormal reaction of the patient, or of later complication, without mention of misadventure at the time of the procedure: Secondary | ICD-10-CM | POA: Insufficient documentation

## 2011-08-11 DIAGNOSIS — Z7982 Long term (current) use of aspirin: Secondary | ICD-10-CM | POA: Insufficient documentation

## 2011-08-11 NOTE — ED Notes (Signed)
Pt c/o having a blockage to his suprapubic cath. Pt states he has been urinating from his penis and feels a lot of pressure in his bladder.

## 2011-08-11 NOTE — ED Notes (Signed)
Pt reports having an indwelling catheter at home that isn't working.  Pt reports that his is urinating around it.  Pt also reports urinary frequency.

## 2011-08-11 NOTE — ED Provider Notes (Addendum)
History   Chart scribed for Carleene Cooper III, MD by Enos Fling; the [name] was seen in room APA12/APA12; Vincent [name]'s care was started at 7:07 AM.    CSN: [id number] Arrival date & time: 08/11/2011  7:04 AM  Chief Complaint  [name] presents with  . Urinary Frequency     HPI Vincent [name] is a 77 y.o. male who presents to the Emergency Department complaining of urinary catheter complications. Vincent [name] reports urinary frequency onset yesterday afternoon and persistent through Vincent AM, stating Vincent [name] has the urge to urinate but can only void small amounts. Vincent [name] has an indwelling catheter but states his urine has been passing through his penis instead of through the catheter. No pain with urination or hematuria. Denies any recent fever, abd pain, or flank pain. No other complaints. Vincent [name] denies ear ache, sore throat, chills, cough, sob, cp, n/v, or syncope.  Vincent [name] reports 1 episode diarrhea yesterday that has resolved today.   Past Medical History  Diagnosis Date  . Urine retention   . Atrial fibrillation   . Lumbar spondylosis   . Prostate hypertrophy   . Arachnoid cyst     Past Surgical History  Procedure Date  . Suprapubic catheter placement   . Transurethral resection of prostate   . Renal abscess drainage     No family history on file.  History  Substance Use Topics  . Smoking status: Former Games developer  . Smokeless tobacco: Not on file  . Alcohol Use: No     Review of Systems 10 Systems reviewed and are negative for acute change except as noted in the HPI.  Allergies  Review of [name]'s allergies indicates no known allergies.  Home Medications   Current Outpatient Rx  Name Route Sig Dispense Refill  . ASPIRIN 81 MG PO TABS Oral Take 81 mg by mouth as needed. For pain    . BUDESONIDE-FORMOTEROL FUMARATE 80-4.5 MCG/ACT IN AERO Inhalation Inhale 2 puffs into the lungs 2 (two) times daily as needed. Shortness of Breath     . FUROSEMIDE 20 MG PO TABS Oral Take 20-40 mg by  mouth 2 (two) times daily as needed. For swelling     . OXYCODONE-ACETAMINOPHEN 5-325 MG PO TABS Oral Take 1 tablet by mouth every 6 (six) hours as needed. For pain       Physical Exam    BP 146/114  Pulse 76  Temp(Src) 97.4 F (36.3 C) (Oral)  Resp 18  SpO2 98%  Physical Exam  Nursing note and vitals reviewed. Constitutional: Vincent [name] is oriented to person, place, and time. Vincent [name] appears well-developed and well-nourished. No distress.  HENT:  Head: Normocephalic.  Mouth/Throat: Oropharynx is clear and moist and mucous membranes are normal.  Eyes: Conjunctivae are normal. Pupils are equal, round, and reactive to light.  Neck: Normal range of motion. Neck supple.  Cardiovascular: Normal rate, regular rhythm and intact distal pulses.  Exam reveals no gallop and no friction rub.   No murmur heard. Pulmonary/Chest: Effort normal and breath sounds normal. Vincent [name] has no wheezes. Vincent [name] has no rales.  Abdominal: Soft. There is no tenderness.       No cva tenderness.  Vincent [name] has a suprapubic catheter.  There is mild skin irritation around the stoma.  Musculoskeletal: Normal range of motion. Vincent [name] exhibits no edema and no tenderness.  Neurological: Vincent [name] is alert and oriented to person, place, and time.  Skin: Skin is warm and dry. No rash noted.  Psychiatric: Vincent [name] has a normal mood and  affect.    ED Course  Procedures  7:39 AM  Suprapubic catheter replaced. His previous catheter was blocked and Vincent [name] leaked urine through the suprapubic stoma until the new catheter was put in.  The nurse and I changed out his catheter and gave him a new leg bag.  OTHER DATA REVIEWED: Nursing notes and vital signs reviewed. Prior records reviewed.   IMPRESSION: 1. Complication, blocked Foley catheter      SCRIBE ATTESTATION:  I personally performed the services described in Vincent documentation, which was scribed in my presence. The recorded information has been reviewed and considered. Osvaldo Human,  MD         Carleene Cooper III, MD 08/11/11 1610  Carleene Cooper III, MD 08/11/11 [phone]

## 2011-08-11 NOTE — ED Notes (Signed)
Pts suprapubic cath changed.

## 2011-08-14 ENCOUNTER — Encounter (HOSPITAL_COMMUNITY): Payer: Self-pay

## 2011-08-14 ENCOUNTER — Emergency Department (HOSPITAL_COMMUNITY)
Admission: EM | Admit: 2011-08-14 | Discharge: 2011-08-14 | Disposition: A | Discharge status: Home / Self Care | Payer: Medicare Other | Attending: Emergency Medicine | Admitting: Emergency Medicine

## 2011-08-14 DIAGNOSIS — R21 Rash and other nonspecific skin eruption: Secondary | ICD-10-CM | POA: Insufficient documentation

## 2011-08-14 DIAGNOSIS — Z87891 Personal history of nicotine dependence: Secondary | ICD-10-CM | POA: Insufficient documentation

## 2011-08-14 DIAGNOSIS — N390 Urinary tract infection, site not specified: Secondary | ICD-10-CM | POA: Insufficient documentation

## 2011-08-14 DIAGNOSIS — I4891 Unspecified atrial fibrillation: Secondary | ICD-10-CM | POA: Insufficient documentation

## 2011-08-14 DIAGNOSIS — L0390 Cellulitis, unspecified: Secondary | ICD-10-CM

## 2011-08-14 LAB — URINALYSIS, ROUTINE W REFLEX MICROSCOPIC
Glucose, UA: NEGATIVE mg/dL
Ketones, ur: NEGATIVE mg/dL
Nitrite: NEGATIVE
Urobilinogen, UA: 0.2 mg/dL (ref 0.0–1.0)

## 2011-08-14 LAB — URINE MICROSCOPIC-ADD ON

## 2011-08-14 MED ORDER — CIPROFLOXACIN HCL 500 MG PO TABS: 500.0000 mg | ORAL_TABLET | Freq: Two times a day (BID) | ORAL | Status: AC

## 2011-08-14 MED ORDER — DOXYCYCLINE HYCLATE 100 MG PO TABS
100.0000 mg | ORAL_TABLET | Freq: Once | ORAL | Status: AC
  Administered 2011-08-14: 100 mg via ORAL
  Filled 2011-08-14: qty 1

## 2011-08-14 MED ORDER — CIPROFLOXACIN HCL 250 MG PO TABS
500.0000 mg | ORAL_TABLET | Freq: Once | ORAL | Status: AC
  Administered 2011-08-14: 500 mg via ORAL
  Filled 2011-08-14: qty 2

## 2011-08-14 MED ORDER — DOXYCYCLINE HYCLATE 100 MG PO TABS: 100.0000 mg | ORAL_TABLET | Freq: Two times a day (BID) | ORAL | Status: AC

## 2011-08-14 NOTE — ED Provider Notes (Addendum)
History     CSN: [id number] Arrival date & time: 08/14/2011  5:16 PM  Chief Complaint  Patient presents with  . Urinary Incontinence    burning around penis since this am, has foley in place    (Consider location/radiation/quality/duration/timing/severity/associated sxs/prior treatment) Patient is a 77 y.o. male presenting with dysuria. The history is provided by the patient and medical records.  Dysuria  This is a recurrent problem. The current episode started yesterday. The problem occurs every urination. The problem has not changed since onset.The quality of the pain is described as burning. The pain is mild. There has been no fever. He is not sexually active. There is no history of pyelonephritis. Pertinent negatives include no chills, no sweats, no nausea, no vomiting, no discharge, no hematuria, no hesitancy, no urgency and no flank pain. He has tried nothing for the symptoms. His past medical history is significant for urological procedure and catheterization. Past medical history comments: The patient has a suprapubic catheter in place that he has had there for some time. He reports that he has urination by his penis once a day in the morning typically and reports that he has had burning with urination. He denies suprapubic pain or flank pn..    Past Medical History  Diagnosis Date  . Urine retention   . Atrial fibrillation   . Lumbar spondylosis   . Prostate hypertrophy   . Arachnoid cyst     Past Surgical History  Procedure Date  . Suprapubic catheter placement   . Transurethral resection of prostate   . Renal abscess drainage     No family history on file.  History  Substance Use Topics  . Smoking status: Former Games developer  . Smokeless tobacco: Not on file  . Alcohol Use: No      Review of Systems  Constitutional: Negative for fever and chills.  HENT: Negative.   Respiratory: Negative.   Gastrointestinal: Negative for nausea, vomiting, abdominal pain and  abdominal distention.  Genitourinary: Positive for dysuria. Negative for hesitancy, urgency, hematuria and flank pain.  Musculoskeletal: Negative for back pain.  Skin: Positive for rash.  Neurological: Negative.   Psychiatric/Behavioral: Negative.     Allergies  Review of patient's allergies indicates no known allergies.  Home Medications   Current Outpatient Rx  Name Route Sig Dispense Refill  . ASPIRIN 81 MG PO TABS Oral Take 81 mg by mouth as needed. For pain    . BUDESONIDE-FORMOTEROL FUMARATE 80-4.5 MCG/ACT IN AERO Inhalation Inhale 2 puffs into the lungs 2 (two) times daily as needed. Shortness of Breath     . FUROSEMIDE 20 MG PO TABS Oral Take 20-40 mg by mouth 2 (two) times daily as needed. For swelling     . OXYCODONE-ACETAMINOPHEN 5-325 MG PO TABS Oral Take 1 tablet by mouth every 6 (six) hours as needed. For pain       BP 136/80  Pulse 68  Temp(Src) 97.5 F (36.4 C) (Oral)  Resp 18  Ht 5\' 6"  (1.676 m)  Wt 260 lb (117.935 kg)  BMI 41.97 kg/m2  SpO2 93%  Physical Exam  Nursing note and vitals reviewed. Constitutional: He is oriented to person, place, and time. He appears well-developed and well-nourished. No distress.  HENT:  Head: Normocephalic and atraumatic.  Eyes: EOM are normal. Pupils are equal, round, and reactive to light.  Cardiovascular: Normal rate and regular rhythm.   Pulmonary/Chest: Effort normal and breath sounds normal. No respiratory distress.  Abdominal: Soft. Bowel sounds are  normal. He exhibits no distension. There is no tenderness. There is no rebound and no guarding.  Genitourinary: Penis normal.       The patient has a suprapubic catheter in place and it is draining clear yellow urine, with no discharge around the catheter insertion site. He does have approximately 5 cm radius of the skin surrounding the catheter insertion site that is erythematous and mildly indurated suggestive of cellulitis. The catheter insertion site is within a  pannicular fold and I can see where the conditions would be right for cellulitis to arise there.  Musculoskeletal: Normal range of motion. He exhibits no edema and no tenderness.  Neurological: He is alert and oriented to person, place, and time. No cranial nerve deficit. Coordination normal.  Skin: Skin is warm and dry. Rash noted. He is not diaphoretic. There is erythema.       Please see the documentation under "genitourinary/anal rectal"  Psychiatric: He has a normal mood and affect. His behavior is normal. Judgment and thought content normal.    ED Course  Procedures (including critical care time)  Labs Reviewed  URINALYSIS, ROUTINE W REFLEX MICROSCOPIC - Abnormal; Notable for the following:    Specific Gravity, Urine >1.030 (*)    Hgb urine dipstick MODERATE (*)    Protein, ur TRACE (*)    Leukocytes, UA SMALL (*)    All other components within normal limits  URINE MICROSCOPIC-ADD ON - Abnormal; Notable for the following:    Bacteria, UA MANY (*)    All other components within normal limits  URINE CULTURE   No results found.   No diagnosis found.    MDM  Urinary tract infection, cellulitis or both entertained in the differential diagnosis. The patient has this allowed [place] from drawing blood to check kidney function or CBC.   The patient has apparent urinary tract infection as well as cellulitis around the catheter insertion site. I have paged urology to coordinate care of the urinary catheter which I anticipate will need to be changed. I have spoken with Dr. Jerre Simon of urology regarding this patient and his indwelling suprapubic catheter as well as urinary tract infection, and he advises to start on antibiotics, culture the urine, and have the patient followup with him in the office on Monday for a change of catheter. I will have the patient followup with urology regarding this issue.  The patient states his understanding of and agreement with this plan of care.    Felisa Bonier, MD 08/14/11 1851  Felisa Bonier, MD 08/14/11 [phone]

## 2011-08-14 NOTE — ED Notes (Signed)
Patient reports burning pain "in my penis". Catheter appears intact and is draining. Patient states he wants a prescription for the burning.

## 2011-08-14 NOTE — ED Notes (Signed)
Pt reports burning around penis since this am, has foley in place, also leaking around cath

## 2011-08-14 NOTE — ED Notes (Signed)
Patient refusing lab work. Dr. Fredricka Bonine made aware and orders for lab draw discontinued.

## 2011-08-18 LAB — URINE CULTURE: Colony Count: >=100000

## 2011-08-19 NOTE — ED Notes (Signed)
Copy of Labs faxed to Dr Annabell Howells office.

## 2011-08-21 ENCOUNTER — Ambulatory Visit (INDEPENDENT_AMBULATORY_CARE_PROVIDER_SITE_OTHER): Payer: Medicare Other | Admitting: Urology

## 2011-08-21 DIAGNOSIS — N401 Enlarged prostate with lower urinary tract symptoms: Secondary | ICD-10-CM

## 2011-09-25 ENCOUNTER — Ambulatory Visit: Payer: Medicare Other | Admitting: Urology

## 2011-10-12 ENCOUNTER — Encounter (HOSPITAL_COMMUNITY): Payer: Self-pay | Admitting: Emergency Medicine

## 2011-10-12 ENCOUNTER — Emergency Department (HOSPITAL_COMMUNITY)
Admission: EM | Admit: 2011-10-12 | Discharge: 2011-10-12 | Disposition: A | Discharge status: Home / Self Care | Payer: Medicare Other | Attending: Emergency Medicine | Admitting: Emergency Medicine

## 2011-10-12 DIAGNOSIS — R338 Other retention of urine: Secondary | ICD-10-CM | POA: Insufficient documentation

## 2011-10-12 DIAGNOSIS — Y849 Medical procedure, unspecified as the cause of abnormal reaction of the patient, or of later complication, without mention of misadventure at the time of the procedure: Secondary | ICD-10-CM | POA: Insufficient documentation

## 2011-10-12 DIAGNOSIS — T8389XA Other specified complication of genitourinary prosthetic devices, implants and grafts, initial encounter: Secondary | ICD-10-CM | POA: Insufficient documentation

## 2011-10-12 DIAGNOSIS — T83091A Other mechanical complication of indwelling urethral catheter, initial encounter: Secondary | ICD-10-CM

## 2011-10-12 NOTE — ED Notes (Addendum)
SP cath  Changed by Dr. Read Drivers.  New 80F cath placed, along with new leg bag. Presently draining clear, pink tinged urine.

## 2011-10-12 NOTE — ED Notes (Signed)
Pt reports SP cath hasn't drained all night.  No urine noted in leg bag.  Bladder distended.  Attempted to irrigate catheter.  Unable to flush.  Dr. Read Drivers notified.

## 2011-10-12 NOTE — ED Provider Notes (Signed)
History     CSN: [id number] Arrival date & time: 10/12/2011  3:10 AM   First MD Initiated Contact with Patient 10/12/11 [phone]      Chief Complaint  Patient presents with  . Urinary Retention    (Consider location/radiation/quality/duration/timing/severity/associated sxs/prior treatment) HPI This is a 77-year-old white male with a history of suprapubic indwelling Foley. He states he has had no drainage from the Foley and was back since yesterday. His bladder is distended and is having moderate discomfort. His nurse attempted irrigation of the catheter prior to my evaluation without success. He denies fevers, chills, nausea, vomiting or diarrhea. He states he is supposed to have his catheter replaced every 30 days but cannot recall how long it had been.  Past Medical History  Diagnosis Date  . Urine retention   . Atrial fibrillation   . Lumbar spondylosis   . Prostate hypertrophy   . Arachnoid cyst     Past Surgical History  Procedure Date  . Suprapubic catheter placement   . Transurethral resection of prostate   . Renal abscess drainage     No family history on file.  History  Substance Use Topics  . Smoking status: Former Games developer  . Smokeless tobacco: Not on file  . Alcohol Use: No      Review of Systems  All other systems reviewed and are negative.    Allergies  Review of patient's allergies indicates no known allergies.  Home Medications   Current Outpatient Rx  Name Route Sig Dispense Refill  . ASPIRIN 81 MG PO TABS Oral Take 81 mg by mouth as needed. For pain    . BUDESONIDE-FORMOTEROL FUMARATE 80-4.5 MCG/ACT IN AERO Inhalation Inhale 2 puffs into the lungs 2 (two) times daily as needed. Shortness of Breath     . FUROSEMIDE 20 MG PO TABS Oral Take 20-40 mg by mouth 2 (two) times daily as needed. For swelling     . OXYCODONE-ACETAMINOPHEN 5-325 MG PO TABS Oral Take 1 tablet by mouth every 6 (six) hours as needed. For pain     . UNKNOWN TO PATIENT Oral Take  1 tablet by mouth daily.        BP 183/134  Pulse 71  Temp(Src) 97.5 F (36.4 C) (Oral)  Resp 20  Ht 5\' 6"  (1.676 m)  Wt 260 lb (117.935 kg)  BMI 41.97 kg/m2  SpO2 95%  Physical Exam General: Well-developed, well-nourished male in no acute distress; appearance consistent with age of record HENT: normocephalic, atraumatic Eyes: Normal appearance Neck: supple Heart: regular rate and rhythm Lungs: Normal respiratory effort and excursion Abdomen: soft; suprapubic tenderness; suprapubic catheter in place Extremities: No deformity; pulses normal Neurologic: Awake, alert; motor function intact in all extremities and symmetric; no facial droop Skin: Warm and dry Psychiatric: Normal mood and affect    ED Course  SUPRAPUBIC TUBE PLACEMENT Date/Time: 10/12/2011 3:35 AM Performed by: Hanley Seamen Authorized by: Hanley Seamen Consent: Verbal consent obtained. Indications: catheter change and urinary retention Local anesthesia used: no Patient sedated: no Preparation: Patient was prepped and draped in the usual sterile fashion. Patient tolerance: Patient tolerated the procedure well with no immediate complications. Comments: The patient's indwelling Foley was removed after deflating the balloon. The tip of the catheter was noted to be almost rotten in appearance. A new 22 French Foley catheter was placed into the stoma and the balloon inflated. There was immediate return of urine with a few blood clots. The Foley was attached to a  leg bag.      MDM          Hanley Seamen, MD 10/12/11 [phone]

## 2011-10-12 NOTE — ED Notes (Signed)
Patient states his foley catheter is not draining into his bag since yesterday.

## 2011-10-13 ENCOUNTER — Emergency Department (HOSPITAL_COMMUNITY)
Admission: EM | Admit: 2011-10-13 | Discharge: 2011-10-13 | Disposition: A | Discharge status: Home / Self Care | Payer: Medicare Other | Attending: Emergency Medicine | Admitting: Emergency Medicine

## 2011-10-13 ENCOUNTER — Encounter (HOSPITAL_COMMUNITY): Payer: Self-pay | Admitting: Emergency Medicine

## 2011-10-13 DIAGNOSIS — I4891 Unspecified atrial fibrillation: Secondary | ICD-10-CM | POA: Insufficient documentation

## 2011-10-13 DIAGNOSIS — Z7982 Long term (current) use of aspirin: Secondary | ICD-10-CM | POA: Insufficient documentation

## 2011-10-13 DIAGNOSIS — M47817 Spondylosis without myelopathy or radiculopathy, lumbosacral region: Secondary | ICD-10-CM | POA: Insufficient documentation

## 2011-10-13 DIAGNOSIS — N390 Urinary tract infection, site not specified: Secondary | ICD-10-CM | POA: Insufficient documentation

## 2011-10-13 LAB — URINALYSIS, ROUTINE W REFLEX MICROSCOPIC
Ketones, ur: NEGATIVE mg/dL
Nitrite: POSITIVE — AB
Protein, ur: 100 mg/dL — AB
pH: 6 (ref 5.0–8.0)

## 2011-10-13 LAB — URINE MICROSCOPIC-ADD ON

## 2011-10-13 MED ORDER — PHENAZOPYRIDINE HCL 100 MG PO TABS
200.0000 mg | ORAL_TABLET | Freq: Once | ORAL | Status: AC
  Administered 2011-10-13: 200 mg via ORAL
  Filled 2011-10-13: qty 2

## 2011-10-13 MED ORDER — SULFAMETHOXAZOLE-TRIMETHOPRIM 800-160 MG PO TABS: 1.0000 | ORAL_TABLET | Freq: Two times a day (BID) | ORAL | Status: AC

## 2011-10-13 MED ORDER — PHENAZOPYRIDINE HCL 200 MG PO TABS: 200.0000 mg | ORAL_TABLET | Freq: Three times a day (TID) | ORAL | Status: AC | PRN

## 2011-10-13 NOTE — ED Notes (Signed)
Patient c/o buring and pain at supra pubic cathter site.

## 2011-10-13 NOTE — ED Provider Notes (Signed)
History  This chart was scribed for Celene Kras, MD by Bennett Scrape. This patient was seen in room APA05/APA05 and the patient's care was started at 9:44AM.  CSN: [id number] Arrival date & time: 10/13/2011  9:36 AM   First MD Initiated Contact with Patient 10/13/11 [phone]      Chief Complaint  Patient presents with  . Dysuria    supra pubic foley  . Urinary Retention    The history is provided by the patient. No language interpreter was used.    Vincent [name] is a 77 y.o. male who presents to the Emergency Department complaining of one day of gradual onset, constant dysuria and pain at the suprapubic cathter site described as burning. Pt states that he feels that the cathter is not functioning properly. Pt denies any other symptoms or injuries. Nothing improves or worsens the symptoms. Pt has not taken any medication to improve the symptoms. Pt has a h/o A Fib and urine retention.  Patient denies fevers chills nausea or vomiting.  Past Medical History  Diagnosis Date  . Urine retention   . Atrial fibrillation   . Lumbar spondylosis   . Prostate hypertrophy   . Arachnoid cyst     Past Surgical History  Procedure Date  . Suprapubic catheter placement   . Transurethral resection of prostate   . Renal abscess drainage     History reviewed. No pertinent family history.  History  Substance Use Topics  . Smoking status: Former Games developer  . Smokeless tobacco: Not on file  . Alcohol Use: No     Review of Systems A complete 10 system review of systems was obtained and is otherwise negative except as noted in the HPI.   Allergies  Review of patient's allergies indicates no known allergies.  Home Medications   Current Outpatient Rx  Name Route Sig Dispense Refill  . ASPIRIN 81 MG PO TABS Oral Take 81 mg by mouth as needed. For pain    . BUDESONIDE-FORMOTEROL FUMARATE 80-4.5 MCG/ACT IN AERO Inhalation Inhale 2 puffs into the lungs 2 (two) times daily as needed.  Shortness of Breath       Triage Vitals: BP 170/92  Pulse 80  Temp(Src) 98.5 F (36.9 C) (Oral)  Resp 15  Ht 5\' 6"  (1.676 m)  Wt 260 lb (117.935 kg)  BMI 41.97 kg/m2  SpO2 99%  Physical Exam  Nursing note and vitals reviewed. Constitutional: He is oriented to person, place, and time. He appears well-developed and well-nourished. No distress.  HENT:  Head: Normocephalic and atraumatic.  Right Ear: External ear normal.  Left Ear: External ear normal.  Eyes: Conjunctivae are normal. Right eye exhibits no discharge. Left eye exhibits no discharge. No scleral icterus.  Neck: Neck supple. No tracheal deviation present.  Cardiovascular: Normal rate, regular rhythm and intact distal pulses.   Pulmonary/Chest: Effort normal and breath sounds normal. No stridor. No respiratory distress. He has no wheezes. He has no rales.  Abdominal: Soft. Bowel sounds are normal. He exhibits no distension. There is no tenderness. There is no rebound and no guarding.       Indwelling suprapubic cathter that is draining yellow urine  Genitourinary: Penis normal. Uncircumcised. No penile erythema or penile tenderness. No discharge found.  Musculoskeletal: He exhibits no edema and no tenderness.  Neurological: He is alert and oriented to person, place, and time. He has normal strength. No sensory deficit. Cranial nerve deficit:  no gross defecits noted. He exhibits normal muscle  tone. He displays no seizure activity. Coordination normal.  Skin: Skin is warm and dry. No rash noted.  Psychiatric: He has a normal mood and affect.    ED Course  Procedures (including critical care time)  DIAGNOSTIC STUDIES: Oxygen Saturation is 99% on room air, normal by my interpretation.    COORDINATION OF CARE: 9:48AM-Discussed treatment plan with patient at bedside and patient agreed to plan.   Labs Reviewed  URINALYSIS, ROUTINE W REFLEX MICROSCOPIC - Abnormal; Notable for the following:    Appearance HAZY (*)    Hgb  urine dipstick LARGE (*)    Protein, ur 100 (*)    Nitrite POSITIVE (*)    Leukocytes, UA MODERATE (*)    All other components within normal limits  URINE MICROSCOPIC-ADD ON - Abnormal; Notable for the following:    Bacteria, UA MANY (*)    All other components within normal limits   No results found.    MDM  Urinalysis does suggest a urinary tract infection. We'll discharge the patient on a course of oral antibiotics and pyridium.  Old records have been reviewed. The patient has been seen numerous times for the same complaint in the emergency department. He generally does not followup with anyone but the emergency department     I personally performed the services described in this documentation, which was scribed in my presence.  The recorded information has been reviewed and considered.     Celene Kras, MD 10/13/11 1010

## 2011-10-15 LAB — URINE CULTURE: Colony Count: >=100000

## 2011-11-13 ENCOUNTER — Emergency Department (HOSPITAL_COMMUNITY)
Admission: EM | Admit: 2011-11-13 | Discharge: 2011-11-13 | Disposition: A | Discharge status: Home / Self Care | Payer: Medicare Other | Attending: Emergency Medicine | Admitting: Emergency Medicine

## 2011-11-13 ENCOUNTER — Encounter (HOSPITAL_COMMUNITY): Payer: Self-pay | Admitting: *Deleted

## 2011-11-13 DIAGNOSIS — R109 Unspecified abdominal pain: Secondary | ICD-10-CM | POA: Insufficient documentation

## 2011-11-13 DIAGNOSIS — Z79899 Other long term (current) drug therapy: Secondary | ICD-10-CM | POA: Insufficient documentation

## 2011-11-13 DIAGNOSIS — N390 Urinary tract infection, site not specified: Secondary | ICD-10-CM | POA: Insufficient documentation

## 2011-11-13 DIAGNOSIS — I4891 Unspecified atrial fibrillation: Secondary | ICD-10-CM | POA: Insufficient documentation

## 2011-11-13 DIAGNOSIS — R339 Retention of urine, unspecified: Secondary | ICD-10-CM | POA: Insufficient documentation

## 2011-11-13 DIAGNOSIS — Z7982 Long term (current) use of aspirin: Secondary | ICD-10-CM | POA: Insufficient documentation

## 2011-11-13 LAB — URINALYSIS, ROUTINE W REFLEX MICROSCOPIC
Bilirubin Urine: NEGATIVE
Glucose, UA: NEGATIVE mg/dL
Specific Gravity, Urine: 1.025 (ref 1.005–1.030)
pH: 6 (ref 5.0–8.0)

## 2011-11-13 LAB — URINE MICROSCOPIC-ADD ON

## 2011-11-13 MED ORDER — CIPROFLOXACIN HCL 500 MG PO TABS: 500.0000 mg | ORAL_TABLET | Freq: Two times a day (BID) | ORAL | Status: AC

## 2011-11-13 NOTE — ED Notes (Signed)
Supra pubic site assessed. Pt had toilet paper rolled up over site. Site cleaned. Odor present.Will notify MD

## 2011-11-13 NOTE — ED Notes (Signed)
Pt d/c home. Rx and d/c instructions given. Pt verbalized understanding.

## 2011-11-13 NOTE — ED Provider Notes (Signed)
History   This chart was scribed for Donnetta Hutching, MD by Clarita Crane. The patient was seen in room APA12/APA12 and the patient's care was started at 7:36AM.   CSN: [id number]  Arrival date & time 11/13/11  0509   First MD Initiated Contact with Patient 11/13/11 [phone]      Chief Complaint  Patient presents with  . Urinary Retention    (Consider location/radiation/quality/duration/timing/severity/associated sxs/prior treatment) HPI Vincent [name] is a 77 y.o. male with an inserted suprapubic catheter who presents to the Emergency Department complaining of constant moderate urinary retention with associated moderate diffuse abdominal pain onset yesterday but currently resolved. Patient reports urinary retention and abdominal pain were completely resolved after flushing of suprapubic catheter by ED nursing staff following arrival. Patient with history of similar urinary retention which has been relieved with flushing of suprapubic catheter in ED in the past. Denies chest pain, SOB, nausea, vomiting, diarrhea.    Past Medical History  Diagnosis Date  . Urine retention   . Atrial fibrillation   . Lumbar spondylosis   . Prostate hypertrophy   . Arachnoid cyst     Past Surgical History  Procedure Date  . Suprapubic catheter placement   . Transurethral resection of prostate   . Renal abscess drainage     History reviewed. No pertinent family history.  History  Substance Use Topics  . Smoking status: Former Games developer  . Smokeless tobacco: Not on file  . Alcohol Use: No     Review of Systems 10 Systems reviewed and are negative for acute change except as noted in the HPI.  Allergies  Review of patient's allergies indicates no known allergies.  Home Medications   Current Outpatient Rx  Name Route Sig Dispense Refill  . ASPIRIN 81 MG PO TABS Oral Take 81 mg by mouth as needed. For pain    . BUDESONIDE-FORMOTEROL FUMARATE 80-4.5 MCG/ACT IN AERO Inhalation Inhale 2 puffs  into the lungs 2 (two) times daily as needed. Shortness of Breath       BP 149/78  Pulse 72  Temp 97.6 F (36.4 C)  Resp 20  Ht 5\' 6"  (1.676 m)  Wt 260 lb (117.935 kg)  BMI 41.97 kg/m2  SpO2 98%  Physical Exam  Nursing note and vitals reviewed. Constitutional: He is oriented to person, place, and time. He appears well-developed and well-nourished. No distress.  HENT:  Head: Normocephalic and atraumatic.  Eyes: EOM are normal. Pupils are equal, round, and reactive to light.  Neck: Neck supple. No tracheal deviation present.  Cardiovascular: Normal rate.   Pulmonary/Chest: Effort normal. No respiratory distress.  Abdominal: Soft. There is no tenderness.       Suprapubic catheter present with no signs of infection. Mildly protuberant.   Musculoskeletal: Normal range of motion. He exhibits no edema.  Neurological: He is alert and oriented to person, place, and time. No sensory deficit.  Skin: Skin is warm and dry.  Psychiatric: He has a normal mood and affect. His behavior is normal.    ED Course  Procedures (including critical care time)  DIAGNOSTIC STUDIES: Oxygen Saturation is 97% on room air, normal by my interpretation.    COORDINATION OF CARE: 7:39AM- Patient reports urinary retention and abdominal pain resolved after flushing of suprapubic catheter by nursing staff. Patient informed of intent to obtain UA and agrees with current plan.    Labs Reviewed  URINALYSIS, ROUTINE W REFLEX MICROSCOPIC - Abnormal; Notable for the following:    Hgb urine  dipstick SMALL (*)    Nitrite POSITIVE (*)    Leukocytes, UA SMALL (*)    All other components within normal limits  URINE MICROSCOPIC-ADD ON - Abnormal; Notable for the following:    Bacteria, UA MANY (*)    All other components within normal limits  WOUND CULTURE   No results found.   No diagnosis found.    MDM  Patient presents with urinary retention. Has suprapubic catheter. Urine has flowed freely after  irrigation. UA shows minor infection. Pt is nontoxic      I personally performed the services described in this documentation, which was scribed in my presence. The recorded information has been reviewed and considered.   Donnetta Hutching, MD 11/13/11 [phone]

## 2011-11-13 NOTE — ED Notes (Signed)
Hand Irrigation supra pubic cath till urine clear. Very few small clots noted. New leg bag applied. Pt states he feels as though his bladder is emptying at this time. Tolerated well. Will inform MD

## 2011-11-13 NOTE — ED Notes (Signed)
Pt has catheter. Cathter not draining

## 2011-11-13 NOTE — ED Notes (Signed)
Pt educated on supra pubic site wound care; pt verbalized understanding

## 2011-11-14 LAB — URINE CULTURE: Culture  Setup Time: 201212281320

## 2011-11-15 ENCOUNTER — Emergency Department (HOSPITAL_COMMUNITY)
Admission: EM | Admit: 2011-11-15 | Discharge: 2011-11-15 | Disposition: A | Discharge status: Home / Self Care | Payer: Medicare Other | Attending: Emergency Medicine | Admitting: Emergency Medicine

## 2011-11-15 ENCOUNTER — Encounter (HOSPITAL_COMMUNITY): Payer: Self-pay

## 2011-11-15 DIAGNOSIS — Y846 Urinary catheterization as the cause of abnormal reaction of the patient, or of later complication, without mention of misadventure at the time of the procedure: Secondary | ICD-10-CM | POA: Insufficient documentation

## 2011-11-15 DIAGNOSIS — R339 Retention of urine, unspecified: Secondary | ICD-10-CM

## 2011-11-15 DIAGNOSIS — Z79899 Other long term (current) drug therapy: Secondary | ICD-10-CM | POA: Insufficient documentation

## 2011-11-15 DIAGNOSIS — T83090A Other mechanical complication of cystostomy catheter, initial encounter: Secondary | ICD-10-CM

## 2011-11-15 DIAGNOSIS — T8389XA Other specified complication of genitourinary prosthetic devices, implants and grafts, initial encounter: Secondary | ICD-10-CM | POA: Insufficient documentation

## 2011-11-15 DIAGNOSIS — Z7982 Long term (current) use of aspirin: Secondary | ICD-10-CM | POA: Insufficient documentation

## 2011-11-15 LAB — WOUND CULTURE: Gram Stain: NONE SEEN

## 2011-11-15 NOTE — ED Provider Notes (Signed)
History     CSN: [id number]  Arrival date & time 11/15/11  0136   First MD Initiated Contact with Patient 11/15/11 [phone]      Chief Complaint  Patient presents with  . Urinary Retention    (Consider location/radiation/quality/duration/timing/severity/associated sxs/prior treatment) The history is provided by the patient.   the patient is a long-standing history of urinary retention in the last approximately 9 months ago received a suprapubic catheter placement for drainage.  He was seen earlier today for urinary retention and his suprapubic catheter was flushed with return of urine and improvement in his symptoms.  He was discharged home.  He was given a prescription for ciprofloxacin given concern for possible infection.  The patient returns this evening reporting ongoing urinary retention and no urine draining from his suprapubic catheter into his back.  He reports he has been able to urinate some through his urethra.  He denies fever and chills.  He denies flank pain.  He has no other complaints.  Past Medical History  Diagnosis Date  . Urine retention   . Atrial fibrillation   . Lumbar spondylosis   . Prostate hypertrophy   . Arachnoid cyst     Past Surgical History  Procedure Date  . Suprapubic catheter placement   . Transurethral resection of prostate   . Renal abscess drainage     No family history on file.  History  Substance Use Topics  . Smoking status: Former Games developer  . Smokeless tobacco: Not on file  . Alcohol Use: No      Review of Systems  All other systems reviewed and are negative.    Allergies  Review of patient's allergies indicates no known allergies.  Home Medications   Current Outpatient Rx  Name Route Sig Dispense Refill  . ASPIRIN 81 MG PO TABS Oral Take 81 mg by mouth as needed. For pain    . BUDESONIDE-FORMOTEROL FUMARATE 80-4.5 MCG/ACT IN AERO Inhalation Inhale 2 puffs into the lungs 2 (two) times daily as needed. Shortness of Breath      . CIPROFLOXACIN HCL 500 MG PO TABS Oral Take 1 tablet (500 mg total) by mouth every 12 (twelve) hours. 10 tablet 0    BP 175/94  Pulse 72  Temp(Src) 98.8 F (37.1 C) (Oral)  Resp 20  Ht 5\' 9"  (1.753 m)  Wt 260 lb (117.935 kg)  BMI 38.40 kg/m2  SpO2 100%  Physical Exam  Constitutional: He is oriented to person, place, and time. He appears well-developed and well-nourished.  HENT:  Head: Normocephalic.  Eyes: EOM are normal.  Neck: Normal range of motion.  Pulmonary/Chest: Effort normal.  Genitourinary: Penis normal.       Suprapubic catheter in place without secondary signs of infection surrounding the catheter.  This catheter was removed without difficulty and appeared to be intact.  10 cc of fluid were removed from the balloon.  The patient has a leg bag on his right leg with some urine in the  Musculoskeletal: Normal range of motion.  Neurological: He is alert and oriented to person, place, and time.  Psychiatric: He has a normal mood and affect.    ED Course  SUPRAPUBIC TUBE PLACEMENT Performed by: Lyanne Co Authorized by: Lyanne Co Consent: Verbal consent obtained. Risks and benefits: risks, benefits and alternatives were discussed Consent given by: patient Required items: required blood products, implants, devices, and special equipment available Patient identity confirmed: verbally with patient Time out: Immediately prior to procedure a "  time out" was called to verify the correct patient, procedure, equipment, support staff and site/side marked as required. Indications: catheter change and urinary retention Local anesthesia used: no Patient sedated: no Preparation: Patient was prepped and draped in the usual sterile fashion. Suprapubic aspiration by: catheter Catheter type: Foley Catheter size: 22 Fr Number of attempts: 1 Urine volume: 60 ml Urine characteristics: yellow Patient tolerance: Patient tolerated the procedure well with no immediate  complications.   (including critical care time)  Labs Reviewed - No data to display No results found.   1. Urinary retention   2. Obstructed suprapubic catheter       MDM  Given the second visit to the emergency department I chose to replace a suprapubic catheter with a new one.  This was changed out without difficulty.  Some urine had returned but it was not a significant amount.  The patient has been urinating some through his urethra.  The patient will require followup with his urologist.  The patient was initiated on ciprofloxacin earlier today I will not change this.       Lyanne Co, MD 11/15/11 [phone]

## 2011-11-15 NOTE — ED Notes (Signed)
Has foley cath, "im only getting 2 squirts out at a time", was here 2 days ago for same and thinks something was done to his cath.

## 2012-03-26 ENCOUNTER — Emergency Department (HOSPITAL_COMMUNITY)
Admission: EM | Admit: 2012-03-26 | Discharge: 2012-03-26 | Disposition: A | Discharge status: Home / Self Care | Payer: Medicare Other | Attending: Emergency Medicine | Admitting: Emergency Medicine

## 2012-03-26 ENCOUNTER — Encounter (HOSPITAL_COMMUNITY): Payer: Self-pay | Admitting: Emergency Medicine

## 2012-03-26 DIAGNOSIS — R300 Dysuria: Secondary | ICD-10-CM | POA: Insufficient documentation

## 2012-03-26 DIAGNOSIS — I4891 Unspecified atrial fibrillation: Secondary | ICD-10-CM | POA: Insufficient documentation

## 2012-03-26 DIAGNOSIS — N390 Urinary tract infection, site not specified: Secondary | ICD-10-CM

## 2012-03-26 DIAGNOSIS — Z9359 Other cystostomy status: Secondary | ICD-10-CM | POA: Insufficient documentation

## 2012-03-26 LAB — URINALYSIS, ROUTINE W REFLEX MICROSCOPIC
Glucose, UA: 100 mg/dL — AB
Ketones, ur: NEGATIVE mg/dL
Specific Gravity, Urine: 1.025 (ref 1.005–1.030)
pH: 7 (ref 5.0–8.0)

## 2012-03-26 MED ORDER — CIPROFLOXACIN HCL 500 MG PO TABS: 500.0000 mg | ORAL_TABLET | Freq: Two times a day (BID) | ORAL | Status: AC

## 2012-03-26 NOTE — ED Notes (Signed)
Pt up walking in halls with no complaints at present.

## 2012-03-26 NOTE — ED Provider Notes (Signed)
History   This chart was scribed for Glynn Octave, MD by Sofie Rower. The patient was seen in room APA18/APA18 and the patient's care was started at 9:41 AM     CSN: [id number]  Arrival date & time 03/26/12  4098   First MD Initiated Contact with Patient 03/26/12 [phone]      Chief Complaint  Patient presents with  . Urinary Tract Infection    (Consider location/radiation/quality/duration/timing/severity/associated sxs/prior treatment) HPI  Vincent [name] is a 78 y.o. male who presents to the Emergency Department complaining of moderate, episodic urinary tract infection onset yesterday with associated symptoms of dysuria. The pt states "the catheter is sore, and it feels like I have to go to the bathroom." The pt informs the EDP that the pain "hurts out of his penis." The pt states "he does not know the last time the catheter was changed." Pt has a hx of urinary tract infection.   Pt denies fever, vomiting, back pain, recently taking any antibiotics.   PCP is Dr.Grapey   Pt does not have a Insurance underwriter.    Past Medical History  Diagnosis Date  . Urine retention   . Atrial fibrillation   . Lumbar spondylosis   . Prostate hypertrophy   . Arachnoid cyst     Past Surgical History  Procedure Date  . Suprapubic catheter placement   . Transurethral resection of prostate   . Renal abscess drainage       History  Substance Use Topics  . Smoking status: Former Games developer  . Smokeless tobacco: Not on file  . Alcohol Use: No      Review of Systems  All other systems reviewed and are negative.    10 Systems reviewed and all are negative for acute change except as noted in the HPI.    Allergies  Review of patient's allergies indicates no known allergies.  Home Medications   Current Outpatient Rx  Name Route Sig Dispense Refill  . ALBUTEROL SULFATE HFA 108 (90 BASE) MCG/ACT IN AERS Inhalation Inhale 2 puffs into the lungs every 6 (six) hours as needed. For asthma     . BUDESONIDE-FORMOTEROL FUMARATE 80-4.5 MCG/ACT IN AERO Inhalation Inhale 2 puffs into the lungs 2 (two) times daily as needed. Shortness of Breath       BP 163/104  Pulse 83  Temp 97.7 F (36.5 C)  Resp 21  SpO2 94%  Physical Exam  Nursing note and vitals reviewed. Constitutional: He is oriented to person, place, and time. He appears well-developed and well-nourished.  HENT:  Right Ear: External ear normal.  Left Ear: External ear normal.  Nose: Nose normal.  Eyes: Conjunctivae and EOM are normal.  Neck: Normal range of motion. Neck supple.  Cardiovascular: Normal rate.   Pulmonary/Chest: Effort normal.  Genitourinary:       Suprapubic catherter in place, mild erythema, yellow urine in the bag with traces of blood.   Musculoskeletal: Normal range of motion.  Neurological: He is alert and oriented to person, place, and time.  Skin: Skin is warm and dry.  Psychiatric: He has a normal mood and affect. His behavior is normal.    ED Course  Procedures (including critical care time)  9:47AM- EDP at bedside discusses treatment plan.   DIAGNOSTIC STUDIES: Oxygen Saturation is 94% on room air, low by my interpretation.    COORDINATION OF CARE:     Labs Reviewed  URINALYSIS, ROUTINE W REFLEX MICROSCOPIC - Abnormal; Notable for the following:  Color, Urine ORANGE (*) BIOCHEMICALS MAY BE AFFECTED BY COLOR   APPearance CLOUDY (*)    Glucose, UA 100 (*)    Hgb urine dipstick LARGE (*)    Bilirubin Urine SMALL (*)    Protein, ur >300 (*)    Urobilinogen, UA 4.0 (*)    Nitrite POSITIVE (*)    Leukocytes, UA MODERATE (*)    All other components within normal limits  URINE MICROSCOPIC-ADD ON - Abnormal; Notable for the following:    Bacteria, UA MANY (*)    All other components within normal limits  URINE CULTURE   No results found.   No diagnosis found.    MDM  Suprapubic catheter with burning with urination. Some blood noted in urine. No abdominal pain, fever,  vomiting. Catheter draining without difficulty.  Infected UA. Culture sent. We'll start oral antibiotics. Followup with urologist Dr. Isabel Caprice.  Catheter draining well.   I personally performed the services described in this documentation, which was scribed in my presence.  The recorded information has been reviewed and considered.    Glynn Octave, MD 03/26/12 1025

## 2012-03-26 NOTE — ED Notes (Signed)
Pt c/o burning urination for a couple days. Pt has suprapubic catheter in place with yellow drainage and redness at insertion site. Urine orange and cloudy in drainage bag. Drainage bag emptied and then specimen obtained to send to lab.

## 2012-03-26 NOTE — ED Notes (Signed)
Pt has significant hx of UTI. C/o burning with urination since last night. Nad. Alert/oriented.

## 2012-03-26 NOTE — Discharge Instructions (Signed)
Urinary Tract Infection Infections of the urinary tract can start in several places. A bladder infection (cystitis), a kidney infection (pyelonephritis), and a prostate infection (prostatitis) are different types of urinary tract infections (UTIs). They usually get better if treated with medicines (antibiotics) that kill germs. Take all the medicine until it is gone. You or your child may feel better in a few days, but TAKE ALL MEDICINE or the infection may not respond and may become more difficult to treat. HOME CARE INSTRUCTIONS   Drink enough water and fluids to keep the urine clear or pale yellow. Cranberry juice is especially recommended, in addition to large amounts of water.   Avoid caffeine, tea, and carbonated beverages. They tend to irritate the bladder.   Alcohol may irritate the prostate.   Only take over-the-counter or prescription medicines for pain, discomfort, or fever as directed by your caregiver.  To prevent further infections:  Empty the bladder often. Avoid holding urine for long periods of time.   After a bowel movement, women should cleanse from front to back. Use each tissue only once.   Empty the bladder before and after sexual intercourse.  FINDING OUT THE RESULTS OF YOUR TEST Not all test results are available during your visit. If your or your child's test results are not back during the visit, make an appointment with your caregiver to find out the results. Do not assume everything is normal if you have not heard from your caregiver or the medical facility. It is important for you to follow up on all test results. SEEK MEDICAL CARE IF:   There is back pain.   Your baby is older than 3 months with a rectal temperature of 100.5 F (38.1 C) or higher for more than 1 day.   Your or your child's problems (symptoms) are no better in 3 days. Return sooner if you or your child is getting worse.  SEEK IMMEDIATE MEDICAL CARE IF:   There is severe back pain or lower  abdominal pain.   You or your child develops chills.   You have a fever.   Your baby is older than 3 months with a rectal temperature of 102 F (38.9 C) or higher.   Your baby is 15 months old or younger with a rectal temperature of 100.4 F (38 C) or higher.   There is nausea or vomiting.   There is continued burning or discomfort with urination.  MAKE SURE YOU:   Understand these instructions.   Will watch your condition.   Will get help right away if you are not doing well or get worse.  Document Released: 08/12/2005 Document Revised: 10/22/2011 Document Reviewed: 03/17/2007 [facility] Patient Information 2012 [place], [place].Suprapubic Catheter Home Guide A suprapubic catheter is a rubber tube with a tiny balloon on the end. It is used to drain urine from the bladder. This catheter is put in your bladder through a small opening in the lower center part of your abdomen. Suprapubic refers to the area right above your pubic bone. The balloon on the end of the catheter is filled with germ-free (sterile) water. This keeps the catheter from slipping out. When the catheter is in place, your urine will drain into a collection bag. The bag can be put beside your bed at night or attached to your leg during the day. HOW TO CARE FOR YOUR CATHETER  Cleaning your skin  Clean the skin around the catheter opening every day.   Wash your hands with soap and  water.   Clean the skin around the opening with a clean washcloth and soapy water. Do not pull on the tube.   Pat the area dry with a clean towel.   Your caregiver may want you to put a bandage (dressing) over the site. Do not use ointment on this area unless your caregiver tells you to.  Cleaning the catheter  Ask your caregiver if you need to clean the catheter and how often.   Use only soap and water.   There may be crusts on the catheter. Put hydrogen peroxide on a cotton ball or gauze pad to remove any crust.  Emptying the  collection bag  You may have a large drainage bag to use at night and a smaller one for daytime. Empty the large bag every 8 hours. Empty the small bag when it is about ? full.   Keep the drainage bag below the level of the catheter. This keeps urine from flowing backwards.   Hold the bag over the toilet or another container. Release the valve (spigot) at the bottom of the bag. Do not touch the opening of the spigot. Do not let the opening touch the toilet or container.   Close the spigot tightly when the bag is empty.  Cleaning the collection bag  Clean the bag every few days.   First, wash your hands.   Disconnect the tubing from the catheter. Replace the used bag with a new bag. Then you can clean the used one.   Empty the used bag completely. Rinse it out with warm water and soap or fill the bag with water and add 1 teaspoon of vinegar. Let it sit for about 30 minutes. Then drain.   The bag should be completely dry before storing it. Put it inside a plastic bag to keep it clean.  Checking everything  Always make sure there are no kinks in the catheter or tubing.   Always make sure there are no leaks in the catheter, tubing, or collection bag.  HOW TO CHANGE YOUR CATHETER Sometimes, a caregiver will change your suprapubic catheter. Other times, you may need to change it yourself. This may be the case if you need to wear a catheter for a long time. Usually, they need to be changed every 4 to 6 weeks. Ask your caregiver how often yours should be changed. Your caregiver will help you order the following supplies for home delivery:  Sterile gloves.   Catheters.   Syringes.   Sterile water.   Sterile cleaning solution.   Lubricant.   Drainage bags.  Changing your catheter  Drink plenty of fluids before changing the catheter.   Wash your hands with soap and water.   Lie on your back and put on sterile gloves.   Clean the skin around the catheter opening. Use the sterile  cleaning solution.   Use a syringe to get the water out of the balloon from the old catheter.   Slowly remove the catheter.   Take off the first pair of gloves, and put on a new pair. Then put lubricant on the tip of the new catheter. Put the new catheter through the opening.   Wait for some urine to start flowing. Then, use the other syringe to fill the balloon with sterile water.   Attach the catheter to your drainage bag. Make sure the connection is tight.  Important warnings  The catheter should come out easily. If it seems stuck, do not pull it.  Call your caregiver right away if you have any trouble while changing the catheter.   When the old catheter is removed, the new one should be put in right away. This is because the opening will close quickly. If you have a problem, go to an emergency clinic right away.  RISKS AND COMPLICATIONS  Urine flow can become blocked. This can happen if the catheter or tubes are not working right. A blood clot can also block urine flow.   The catheter might irritate tissue in your body. This can cause bleeding.   The skin near the opening for the catheter may become irritated or infected.   Bacteria may get into your bladder. This can cause a urinary tract infection.  HOME CARE INSTRUCTIONS  Take all medicines prescribed by your caregiver. Follow the directions carefully.   Drink 8 glasses of water every day. This produces good urine flow.   Check the skin around your catheter a few times every day. Watch for redness and swelling. Look for any fluids coming out of the opening.   Do not use powder or cream around the catheter opening.   Do not take tub baths or use pools or hot tubs.   Keep all follow-up appointments.  SEEK MEDICAL CARE IF:  You leak urine.   Your skin around the catheter becomes red or sore.   Your urine flow slows down.   Your urine gets cloudy or smelly.  SEEK IMMEDIATE MEDICAL CARE IF:   You have chills,  nausea, or back pain.   You have trouble changing your catheter.   Your catheter comes out.   You have blood in your urine.   You have no urine flow for 1 hour.   You have a fever.  Document Released: 07/21/2011 Document Revised: 10/22/2011 Document Reviewed: 07/21/2011 [facility] Patient Information 2012 [place], [place].

## 2012-03-30 LAB — URINE CULTURE: Culture  Setup Time: 201305112029

## 2012-03-31 NOTE — ED Notes (Signed)
+   urine culture. Chart sent to EDP office for review 

## 2012-04-01 ENCOUNTER — Ambulatory Visit (INDEPENDENT_AMBULATORY_CARE_PROVIDER_SITE_OTHER): Payer: Medicare Other | Admitting: Urology

## 2012-04-01 DIAGNOSIS — R338 Other retention of urine: Secondary | ICD-10-CM

## 2012-04-01 DIAGNOSIS — N401 Enlarged prostate with lower urinary tract symptoms: Secondary | ICD-10-CM

## 2012-04-05 ENCOUNTER — Emergency Department (HOSPITAL_COMMUNITY)
Admission: EM | Admit: 2012-04-05 | Discharge: 2012-04-05 | Disposition: A | Discharge status: Home / Self Care | Payer: Medicare Other | Attending: Emergency Medicine | Admitting: Emergency Medicine

## 2012-04-05 ENCOUNTER — Encounter (HOSPITAL_COMMUNITY): Payer: Self-pay

## 2012-04-05 DIAGNOSIS — Y849 Medical procedure, unspecified as the cause of abnormal reaction of the patient, or of later complication, without mention of misadventure at the time of the procedure: Secondary | ICD-10-CM | POA: Insufficient documentation

## 2012-04-05 DIAGNOSIS — Z87891 Personal history of nicotine dependence: Secondary | ICD-10-CM | POA: Insufficient documentation

## 2012-04-05 DIAGNOSIS — T8389XA Other specified complication of genitourinary prosthetic devices, implants and grafts, initial encounter: Secondary | ICD-10-CM | POA: Insufficient documentation

## 2012-04-05 DIAGNOSIS — T839XXA Unspecified complication of genitourinary prosthetic device, implant and graft, initial encounter: Secondary | ICD-10-CM

## 2012-04-05 NOTE — ED Provider Notes (Signed)
History   This chart was scribed for Vincent Gaskins, MD by Clarita Crane. The patient was seen in room APA08/APA08. Patient's care was started at 0817.    CSN: [id number]  Arrival date & time 04/05/12  8657   First MD Initiated Contact with Patient 04/05/12 [phone]      Chief Complaint  Patient presents with  . Urinary Retention     HPI Vincent [name] is a 78 y.o. male who presents to the Emergency Department complaining of intermittent mild to moderate leaking from suprapubic catheter which began several months ago. Patient states that catheter is drains properly aside from intermittent leaking. Patient has been evaluated in ED several times for same complaint and advised to follow up with Dr. Isabel Caprice but reports that he has not done so. Patient with h/o urinary retention, a-fib, prostate hypertrophy, transurethral resection of prostate.  Past Medical History  Diagnosis Date  . Urine retention   . Atrial fibrillation   . Lumbar spondylosis   . Prostate hypertrophy   . Arachnoid cyst     Past Surgical History  Procedure Date  . Suprapubic catheter placement   . Transurethral resection of prostate   . Renal abscess drainage     No family history on file.  History  Substance Use Topics  . Smoking status: Former Games developer  . Smokeless tobacco: Not on file  . Alcohol Use: No      Review of Systems  Constitutional: Negative for fever.  Gastrointestinal: Negative for vomiting.  Genitourinary: Negative for hematuria.       Leaking from Foley Catheter    Allergies  Review of patient's allergies indicates no known allergies.  Home Medications   Current Outpatient Rx  Name Route Sig Dispense Refill  . ALBUTEROL SULFATE HFA 108 (90 BASE) MCG/ACT IN AERS Inhalation Inhale 2 puffs into the lungs every 6 (six) hours as needed. For asthma    . BUDESONIDE-FORMOTEROL FUMARATE 80-4.5 MCG/ACT IN AERO Inhalation Inhale 2 puffs into the lungs 2 (two) times daily as needed.  Shortness of Breath     . CIPROFLOXACIN HCL 500 MG PO TABS Oral Take 1 tablet (500 mg total) by mouth every 12 (twelve) hours. 20 tablet 0    BP 138/72  Pulse 68  Temp(Src) 98 F (36.7 C) (Oral)  Resp 20  Wt 260 lb (117.935 kg)  SpO2 98%  Physical Exam CONSTITUTIONAL: Well developed/well nourished HEAD AND FACE: Normocephalic/atraumatic EYES: EOMI ENMT: Mucous membranes moist NECK: supple no meningeal signs CV: S1/S2 noted, no murmurs/rubs/gallops noted LUNGS: Lungs are clear to auscultation bilaterally, no apparent distress ABDOMEN: soft, nontender, no rebound or guarding GU: suprapubic catheter in place and clean without leaking, without erythema, urine noted in foley bag NEURO: Pt is awake/alert, moves all extremitiesx4 EXTREMITIES: full ROM SKIN: warm, color normal PSYCH: no abnormalities of mood noted  ED Course  Procedures   DIAGNOSTIC STUDIES: Oxygen Saturation is 98% on room air, normal by my interpretation.    COORDINATION OF CARE: 9:04AM- Patient advised to follow up with Dr. Isabel Caprice regarding Suprapubic catheter.    The patient appears reasonably screened and/or stabilized for discharge and I doubt any other medical condition or other [facility] requiring further screening, evaluation, or treatment in the ED at this time prior to discharge.     MDM  Nursing notes reviewed and considered in documentation       I personally performed the services described in this documentation, which was scribed in my presence. The  recorded information has been reviewed and considered.      Vincent Gaskins, MD 04/05/12 1020

## 2012-04-05 NOTE — ED Notes (Signed)
Pt stated he is here for "my urine bag", then stated he forgot why he is here.

## 2012-04-06 NOTE — ED Notes (Signed)
Patient treated for UTI per Fayrene Helper.

## 2012-04-07 ENCOUNTER — Emergency Department (HOSPITAL_COMMUNITY)
Admission: EM | Admit: 2012-04-07 | Discharge: 2012-04-07 | Disposition: A | Discharge status: Home / Self Care | Payer: Medicare Other | Attending: Emergency Medicine | Admitting: Emergency Medicine

## 2012-04-07 ENCOUNTER — Encounter (HOSPITAL_COMMUNITY): Payer: Self-pay | Admitting: *Deleted

## 2012-04-07 DIAGNOSIS — R300 Dysuria: Secondary | ICD-10-CM | POA: Insufficient documentation

## 2012-04-07 DIAGNOSIS — N390 Urinary tract infection, site not specified: Secondary | ICD-10-CM | POA: Insufficient documentation

## 2012-04-07 DIAGNOSIS — N489 Disorder of penis, unspecified: Secondary | ICD-10-CM | POA: Insufficient documentation

## 2012-04-07 DIAGNOSIS — Z87891 Personal history of nicotine dependence: Secondary | ICD-10-CM | POA: Insufficient documentation

## 2012-04-07 LAB — URINALYSIS, ROUTINE W REFLEX MICROSCOPIC
Glucose, UA: 250 mg/dL — AB
Specific Gravity, Urine: 1.015 (ref 1.005–1.030)
pH: 5.5 (ref 5.0–8.0)

## 2012-04-07 LAB — URINE MICROSCOPIC-ADD ON

## 2012-04-07 MED ORDER — CIPROFLOXACIN HCL 250 MG PO TABS
500.0000 mg | ORAL_TABLET | Freq: Once | ORAL | Status: AC
  Administered 2012-04-07: 500 mg via ORAL
  Filled 2012-04-07: qty 2

## 2012-04-07 MED ORDER — CIPROFLOXACIN HCL 500 MG PO TABS: 500.0000 mg | ORAL_TABLET | Freq: Two times a day (BID) | ORAL | Status: AC

## 2012-04-07 NOTE — ED Notes (Signed)
Pt c/o burning in his urinary tract. Pt states he has a urinary catheter in place.

## 2012-04-07 NOTE — ED Notes (Signed)
Patient upset over wait time. Patient informed that the staff is doing the best we can and that lab work takes a while. UA collected from foley catheter bag.

## 2012-04-07 NOTE — Discharge Instructions (Signed)
Urinary Tract Infection A urinary tract infection (UTI) is often caused by a germ (bacteria). A UTI is usually helped with medicine (antibiotics) that kills germs. Take all the medicine until it is gone. Do this even if you are feeling better. You are usually better in 7 to 10 days. HOME CARE   Drink enough water and fluids to keep your pee (urine) clear or pale yellow. Drink:   Cranberry juice.   Water.   Avoid:   Caffeine.   Tea.   Bubbly (carbonated) drinks.   Alcohol.   Only take medicine as told by your doctor.   To prevent further infections:   Pee often.   After pooping (bowel movement), women should wipe from front to back. Use each tissue only once.   Pee before and after having sex (intercourse).  Ask your doctor when your test results will be ready. Make sure you follow up and get your test results.  GET HELP RIGHT AWAY IF:   There is very bad back pain or lower belly (abdominal) pain.   You get the chills.   You have a fever.   Your baby is older than 3 months with a rectal temperature of 102 F (38.9 C) or higher.   Your baby is 73 months old or younger with a rectal temperature of 100.4 F (38 C) or higher.   You feel sick to your stomach (nauseous) or throw up (vomit).   There is continued burning with peeing.   Your problems are not better in 3 days. Return sooner if you are getting worse.  MAKE SURE YOU:   Understand these instructions.   Will watch your condition.   Will get help right away if you are not doing well or get worse.  Document Released: 04/20/2008 Document Revised: 10/22/2011 Document Reviewed: 04/20/2008 [facility] Patient Information 2012 [place], [place].  You have a urinary infection. Antibiotic for 10 days. Recommend following up with local urologist. Phone number given.

## 2012-04-07 NOTE — ED Provider Notes (Signed)
History   This chart was scribed for Vincent Hutching, MD by Clarita Crane. The patient was seen in room APA09/APA09. Patient's care was started at 0947.    CSN: [id number]  Arrival date & time 04/07/12  4098   First MD Initiated Contact with Patient 04/07/12 1007      Chief Complaint  Patient presents with  . Dysuria    (Consider location/radiation/quality/duration/timing/severity/associated sxs/prior treatment) HPI MERL BOMMARITO is a 78 y.o. male who presents to the Emergency Department complaining of constant moderate pain to penis described as burning onset several yesterday and persistent since. Patient reports having suprapubic catheter placed several years ago. Patient reports taking Phenazopyridine HCl without relief. Denies penile d/c, abdominal pain, nausea, vomiting. Patient has been evaluated in ED on multiple occasions for issues regarding suprapubic catheter and referred to Dr. Isabel Caprice but he states he has not followed up with Dr. Isabel Caprice to this point. Patient with h/o atrial fibrillation, prostate hypertrophy, urine retention.    Past Medical History  Diagnosis Date  . Urine retention   . Atrial fibrillation   . Lumbar spondylosis   . Prostate hypertrophy   . Arachnoid cyst     Past Surgical History  Procedure Date  . Suprapubic catheter placement   . Transurethral resection of prostate   . Renal abscess drainage     History reviewed. No pertinent family history.  History  Substance Use Topics  . Smoking status: Former Games developer  . Smokeless tobacco: Not on file  . Alcohol Use: No      Review of Systems A complete 10 system review of systems was obtained and all systems are negative except as noted in the HPI and PMH.   Allergies  Review of patient's allergies indicates no known allergies.  Home Medications   Current Outpatient Rx  Name Route Sig Dispense Refill  . ALBUTEROL SULFATE HFA 108 (90 BASE) MCG/ACT IN AERS Inhalation Inhale 2 puffs into  the lungs every 6 (six) hours as needed. For asthma    . BUDESONIDE-FORMOTEROL FUMARATE 80-4.5 MCG/ACT IN AERO Inhalation Inhale 2 puffs into the lungs 2 (two) times daily as needed. Shortness of Breath       BP 134/70  Pulse 71  Temp(Src) 97.8 F (36.6 C) (Oral)  Resp 18  Wt 260 lb (117.935 kg)  SpO2 97%  Physical Exam  Nursing note and vitals reviewed. Constitutional: He is oriented to person, place, and time. He appears well-developed and well-nourished. No distress.  HENT:  Head: Normocephalic and atraumatic.  Eyes: EOM are normal. Pupils are equal, round, and reactive to light.  Neck: Neck supple. No tracheal deviation present.  Cardiovascular: Normal rate.   Pulmonary/Chest: Effort normal. No respiratory distress.  Abdominal: Soft. He exhibits no distension.  Genitourinary: Penis normal.       Suprapubic catheter in place. Pink-red color to urine within foley bag.   Musculoskeletal: Normal range of motion. He exhibits no edema.  Neurological: He is alert and oriented to person, place, and time. No sensory deficit.  Skin: Skin is warm and dry.  Psychiatric: He has a normal mood and affect. His behavior is normal.    ED Course  Procedures (including critical care time)  DIAGNOSTIC STUDIES: Oxygen Saturation is 97% on room air, normal by my interpretation.    COORDINATION OF CARE: 10:56AM-Patient informed of current plan for treatment and evaluation and agrees with plan at this time.     Labs Reviewed  URINALYSIS, ROUTINE W REFLEX MICROSCOPIC -  Abnormal; Notable for the following:    Color, Urine AMBER (*) BIOCHEMICALS MAY BE AFFECTED BY COLOR   Glucose, UA 250 (*)    Hgb urine dipstick TRACE (*)    Bilirubin Urine SMALL (*)    Protein, ur 100 (*)    Urobilinogen, UA >8.0 (*)    Nitrite POSITIVE (*)    Leukocytes, UA MODERATE (*)    All other components within normal limits  URINE MICROSCOPIC-ADD ON - Abnormal; Notable for the following:    Bacteria, UA MANY  (*)    All other components within normal limits   No results found.   No diagnosis found.    MDM  Patient has suprapubic catheter. Urine sample shows infection. Will start Cipro. Refer to urology.      I personally performed the services described in this documentation, which was scribed in my presence. The recorded information has been reviewed and considered.    Vincent Hutching, MD 04/07/12 1245

## 2012-04-07 NOTE — ED Notes (Signed)
Patient with no complaints at this time. Respirations even and unlabored. Skin warm/dry. Discharge instructions reviewed with patient at this time. Patient given opportunity to voice concerns/ask questions. Patient discharged at this time and left Emergency Department with steady gait.   

## 2012-04-10 LAB — URINE CULTURE

## 2012-04-13 NOTE — ED Notes (Signed)
+  Urine. Chart sent to EDP office for review. Chart returned from EDP office. Prescribed PCN VK 500 mg po 4 times a day x 5 days. Prescribed by Felicie Morn NPC.

## 2012-04-13 NOTE — ED Notes (Signed)
Attempted to call patient.  No answer.

## 2012-04-14 NOTE — ED Notes (Signed)
Attempted to call patient.  No answer.

## 2012-04-16 NOTE — ED Notes (Signed)
Patient returned call, notified of positive urine result. RX PCN VK called to [facility] [phone]

## 2012-04-16 NOTE — ED Notes (Signed)
Attempt to call patient. No answer.  

## 2012-04-25 ENCOUNTER — Emergency Department (HOSPITAL_COMMUNITY)
Admission: EM | Admit: 2012-04-25 | Discharge: 2012-04-25 | Disposition: A | Discharge status: Home / Self Care | Payer: Medicare Other | Attending: Emergency Medicine | Admitting: Emergency Medicine

## 2012-04-25 ENCOUNTER — Encounter (HOSPITAL_COMMUNITY): Payer: Self-pay

## 2012-04-25 DIAGNOSIS — M47817 Spondylosis without myelopathy or radiculopathy, lumbosacral region: Secondary | ICD-10-CM | POA: Insufficient documentation

## 2012-04-25 DIAGNOSIS — N401 Enlarged prostate with lower urinary tract symptoms: Secondary | ICD-10-CM | POA: Insufficient documentation

## 2012-04-25 DIAGNOSIS — I4891 Unspecified atrial fibrillation: Secondary | ICD-10-CM | POA: Insufficient documentation

## 2012-04-25 DIAGNOSIS — Z87891 Personal history of nicotine dependence: Secondary | ICD-10-CM | POA: Insufficient documentation

## 2012-04-25 DIAGNOSIS — N390 Urinary tract infection, site not specified: Secondary | ICD-10-CM | POA: Insufficient documentation

## 2012-04-25 DIAGNOSIS — N138 Other obstructive and reflux uropathy: Secondary | ICD-10-CM | POA: Insufficient documentation

## 2012-04-25 LAB — URINALYSIS, ROUTINE W REFLEX MICROSCOPIC
Specific Gravity, Urine: 1.02 (ref 1.005–1.030)
pH: 5.5 (ref 5.0–8.0)

## 2012-04-25 LAB — URINE MICROSCOPIC-ADD ON

## 2012-04-25 MED ORDER — CEPHALEXIN 500 MG PO CAPS: 500.0000 mg | ORAL_CAPSULE | Freq: Four times a day (QID) | ORAL | Status: AC

## 2012-04-25 MED ORDER — PHENAZOPYRIDINE HCL 200 MG PO TABS: 200.0000 mg | ORAL_TABLET | Freq: Three times a day (TID) | ORAL | Status: AC

## 2012-04-25 NOTE — ED Notes (Signed)
Pt c/o burning with urination since yesterday. Pt has suprapubic catheter.

## 2012-04-25 NOTE — ED Notes (Signed)
Pt expressed understanding of discharge instructions, denies any questions

## 2012-04-25 NOTE — ED Provider Notes (Signed)
History  This chart was scribed for Vincent Baker, MD by Bennett Scrape. This patient was seen in room APA03/APA03 and the patient's care was started at 7:18AM.  CSN: [id number]  Arrival date & time 04/25/12  0707   First MD Initiated Contact with Patient 04/25/12 [phone]      Chief Complaint  Patient presents with  . Dysuria   The history is provided by the patient. No language interpreter was used.    Vincent [name] is a 78 y.o. male who presents to the Emergency Department complaining of gradual onset, gradually worsening, intermittently felt dysuria described as a burning sensation with associated nausea that started yesterday. He denies any modifying factors. He has not taken any OTC medications to improve his symptoms. He reports having prior bladder infections before. He denies fever, emesis, and back pain as associated symptoms. He has a h/o A. Fib, lumbar spondylosis, prostate hypertrophy and urine retention.  Pt has a suprapubic catheter that he states he cleans himself.   Past Medical History  Diagnosis Date  . Urine retention   . Atrial fibrillation   . Lumbar spondylosis   . Prostate hypertrophy   . Arachnoid cyst     Past Surgical History  Procedure Date  . Suprapubic catheter placement   . Transurethral resection of prostate   . Renal abscess drainage     No family history on file.  History  Substance Use Topics  . Smoking status: Former Games developer  . Smokeless tobacco: Not on file  . Alcohol Use: No      Review of Systems  Constitutional: Negative for fever.       10 Systems reviewed and are negative for acute change except as noted in the HPI.  HENT: Negative for rhinorrhea.   Eyes: Negative for visual disturbance.  Respiratory: Negative for cough and shortness of breath.   Cardiovascular: Negative for chest pain.  Gastrointestinal: Positive for nausea. Negative for vomiting, abdominal pain and diarrhea.  Genitourinary: Positive for dysuria.  Negative for frequency and hematuria.  Musculoskeletal: Negative for back pain.  Skin: Negative for rash.  Neurological: Negative for weakness and headaches.    Allergies  Review of patient's allergies indicates no known allergies.  Home Medications   Current Outpatient Rx  Name Route Sig Dispense Refill  . ALBUTEROL SULFATE HFA 108 (90 BASE) MCG/ACT IN AERS Inhalation Inhale 2 puffs into the lungs every 6 (six) hours as needed. For asthma    . BUDESONIDE-FORMOTEROL FUMARATE 80-4.5 MCG/ACT IN AERO Inhalation Inhale 2 puffs into the lungs 2 (two) times daily as needed. Shortness of Breath       Triage Vitals: BP 182/71  Pulse 81  Temp(Src) 97.5 F (36.4 C) (Oral)  Resp 24  Ht 5\' 6"  (1.676 m)  Wt 260 lb (117.935 kg)  BMI 41.97 kg/m2  Physical Exam  Nursing note and vitals reviewed. Constitutional: He is oriented to person, place, and time. He appears well-developed and well-nourished. No distress.  HENT:  Head: Normocephalic and atraumatic.  Eyes: Conjunctivae and EOM are normal.  Neck: Neck supple. No tracheal deviation present.  Cardiovascular: Normal rate.   Pulmonary/Chest: Effort normal. No respiratory distress.  Abdominal: Soft. He exhibits no distension. There is no tenderness.       No erythema or drainage around the suprapubic catheter, no suprapubic pain  Musculoskeletal: Normal range of motion.  Neurological: He is alert and oriented to person, place, and time. No sensory deficit.  Skin: Skin is dry.  Psychiatric: He has a normal mood and affect. His behavior is normal.    ED Course  Procedures (including critical care time)  DIAGNOSTIC STUDIES: None performed.    COORDINATION OF CARE: 7:44AM-Discussed treatment plan which includes urinalysis with pt and pt agreed to plan.  Labs Reviewed  URINALYSIS, ROUTINE W REFLEX MICROSCOPIC - Abnormal; Notable for the following:    Color, Urine ORANGE (*) BIOCHEMICALS MAY BE AFFECTED BY COLOR   APPearance HAZY (*)     Glucose, UA 250 (*)    Hgb urine dipstick LARGE (*)    Bilirubin Urine MODERATE (*)    Protein, ur >300 (*)    Urobilinogen, UA >8.0 (*)    Nitrite POSITIVE (*)    Leukocytes, UA LARGE (*)    All other components within normal limits  URINE MICROSCOPIC-ADD ON - Abnormal; Notable for the following:    Bacteria, UA MANY (*)    All other components within normal limits  URINE CULTURE   No results found.   No diagnosis found.    MDM  Pt to be treated for uti   I personally performed the services described in this documentation, which was scribed in my presence. The recorded information has been reviewed and considered.      Vincent Baker, MD 04/25/12 [phone]

## 2012-04-25 NOTE — Discharge Instructions (Signed)

## 2012-04-25 NOTE — ED Notes (Signed)
Pt c/o burning with urination, pt has indwelling cath in from home

## 2012-04-27 LAB — URINE CULTURE: Culture  Setup Time: 201306101356

## 2012-04-29 NOTE — ED Notes (Signed)
+  Urine. Chart sent to EDP office for review. Chart returned from EDP office. Prescribed Macrobid 100 mg PO BID x 7 days. Follow-up with PCP ASAP. Return for worsening symptoms. Prescribed/reviewed by Trixie Dredge PA-C.

## 2012-04-29 NOTE — ED Notes (Signed)
Attempted to call patient.  No answer.

## 2012-04-30 NOTE — ED Notes (Signed)
Attempted to contact patient. No answer. 

## 2012-05-02 NOTE — ED Notes (Signed)
Prescription called in to [facility] in  at [phone] for macrobid 100mg  po bid for 7 days, no refills per [employer].

## 2012-05-03 ENCOUNTER — Ambulatory Visit (INDEPENDENT_AMBULATORY_CARE_PROVIDER_SITE_OTHER): Payer: Medicare Other | Admitting: Urology

## 2012-05-03 DIAGNOSIS — N401 Enlarged prostate with lower urinary tract symptoms: Secondary | ICD-10-CM

## 2012-06-03 ENCOUNTER — Ambulatory Visit (INDEPENDENT_AMBULATORY_CARE_PROVIDER_SITE_OTHER): Payer: Medicare Other | Admitting: Urology

## 2012-06-03 DIAGNOSIS — N401 Enlarged prostate with lower urinary tract symptoms: Secondary | ICD-10-CM

## 2012-07-12 ENCOUNTER — Emergency Department (HOSPITAL_COMMUNITY)
Admission: EM | Admit: 2012-07-12 | Discharge: 2012-07-12 | Disposition: A | Discharge status: Home / Self Care | Payer: Medicare Other | Attending: Emergency Medicine | Admitting: Emergency Medicine

## 2012-07-12 ENCOUNTER — Encounter (HOSPITAL_COMMUNITY): Payer: Self-pay | Admitting: Emergency Medicine

## 2012-07-12 DIAGNOSIS — M47817 Spondylosis without myelopathy or radiculopathy, lumbosacral region: Secondary | ICD-10-CM | POA: Insufficient documentation

## 2012-07-12 DIAGNOSIS — I4891 Unspecified atrial fibrillation: Secondary | ICD-10-CM | POA: Insufficient documentation

## 2012-07-12 DIAGNOSIS — Z466 Encounter for fitting and adjustment of urinary device: Secondary | ICD-10-CM

## 2012-07-12 DIAGNOSIS — Z87891 Personal history of nicotine dependence: Secondary | ICD-10-CM | POA: Insufficient documentation

## 2012-07-12 LAB — URINALYSIS, ROUTINE W REFLEX MICROSCOPIC
Bilirubin Urine: NEGATIVE
Nitrite: NEGATIVE
Specific Gravity, Urine: 1.025 (ref 1.005–1.030)
Urobilinogen, UA: 0.2 mg/dL (ref 0.0–1.0)
pH: 6 (ref 5.0–8.0)

## 2012-07-12 LAB — URINE MICROSCOPIC-ADD ON

## 2012-07-12 NOTE — ED Notes (Signed)
Pt presented with a suprapubic cathiter which is hooked to a leg bag which is empty.  The man is using a depends which is wet. Pt complains that his leg bag is not filling.

## 2012-07-12 NOTE — ED Provider Notes (Signed)
History     CSN: [id number]  Arrival date & time 07/12/12  0850   First MD Initiated Contact with Patient 07/12/12 [phone]      Chief Complaint  Patient presents with  . Urine Output    catheter leaking; leg bag not collecting urine    (Consider location/radiation/quality/duration/timing/severity/associated sxs/prior treatment) HPI Comments: Patient comes to ED c/o leaking from his suprapubic catheter.  Urine has been leaking from the insertion site and not draining in to the leg bag.  States the symptoms have been persisting for one month but states has recently became worse.  States he has been wearing a "diaper" to catch the urine.  He denies fever, chills, vomiting , scrotal swelling or any pain.  He also states that he has not seen his urologist for "some time".    The history is provided by the patient.    Past Medical History  Diagnosis Date  . Urine retention   . Atrial fibrillation   . Lumbar spondylosis   . Prostate hypertrophy   . Arachnoid cyst     Past Surgical History  Procedure Date  . Suprapubic catheter placement   . Transurethral resection of prostate   . Renal abscess drainage     History reviewed. No pertinent family history.  History  Substance Use Topics  . Smoking status: Former Games developer  . Smokeless tobacco: Not on file  . Alcohol Use: No      Review of Systems  Constitutional: Negative for fever, chills, activity change and appetite change.  Gastrointestinal: Negative for nausea, vomiting, abdominal pain and abdominal distention.  Genitourinary: Negative for frequency, hematuria, flank pain, decreased urine volume, discharge, penile swelling, scrotal swelling, difficulty urinating, genital sores, penile pain and testicular pain.  Musculoskeletal: Negative for back pain.  Skin: Negative for rash and wound.  Neurological: Negative for dizziness, light-headedness and numbness.  Hematological: Does not bruise/bleed easily.  All other systems  reviewed and are negative.    Allergies  Review of patient's allergies indicates no known allergies.  Home Medications   Current Outpatient Rx  Name Route Sig Dispense Refill  . ALBUTEROL SULFATE HFA 108 (90 BASE) MCG/ACT IN AERS Inhalation Inhale 2 puffs into the lungs every 6 (six) hours as needed. For asthma    . BUDESONIDE-FORMOTEROL FUMARATE 80-4.5 MCG/ACT IN AERO Inhalation Inhale 2 puffs into the lungs 2 (two) times daily as needed. Shortness of Breath       BP 126/92  Pulse 79  Temp 97.9 F (36.6 C) (Oral)  Resp 18  Ht 5\' 6"  (1.676 m)  Wt 260 lb (117.935 kg)  BMI 41.97 kg/m2  SpO2 92%  Physical Exam  Nursing note and vitals reviewed. Constitutional: He is oriented to person, place, and time. He appears well-developed and well-nourished. No distress.  HENT:  Head: Normocephalic and atraumatic.  Cardiovascular: Normal rate, regular rhythm, normal heart sounds and intact distal pulses.   No murmur heard. Pulmonary/Chest: Effort normal and breath sounds normal.  Abdominal: Soft. He exhibits no distension and no mass. There is no tenderness. There is no rebound and no guarding.  Genitourinary:       Pt has suprapubic catheter in place.  No erythema, edema or suprapubic tenderness.  Penis appears nml.  Pt wearing a Depends that's wet with urine.  Scant amt of cloudy urine in the leg bag.  Musculoskeletal: Normal range of motion. He exhibits no edema.  Neurological: He is alert and oriented to person, place, and time. He  exhibits normal muscle tone. Coordination normal.  Skin: Skin is warm and dry.    ED Course  Procedures (including critical care time)  Labs Reviewed  URINALYSIS, ROUTINE W REFLEX MICROSCOPIC - Abnormal; Notable for the following:    Color, Urine RED (*)  BIOCHEMICALS MAY BE AFFECTED BY COLOR   APPearance HAZY (*)     Hgb urine dipstick LARGE (*)     Protein, ur 100 (*)     Leukocytes, UA LARGE (*)     All other components within normal limits    URINE MICROSCOPIC-ADD ON - Abnormal; Notable for the following:    Bacteria, UA MANY (*)     All other components within normal limits  URINE CULTURE   Urine culture is pending    1. Urinary catheter insertion/adjustment/removal       MDM  Multiple previous ER visits for same. Previous ER charts and laboratory results were reviewed by me. Also discussed patient history and results with the EDP prior to patient's discharge    Patient is alert, ambulatory, well-appearing. Catheter replaced by nursing staff.  Straw colored urine in the leg bag.  No leaking at this time.   Abdomen remains soft and nontender. Patient agrees to close followup with his urologist   The patient appears reasonably screened and/or stabilized for discharge and I doubt any other medical condition or other [facility] requiring further screening, evaluation, or treatment in the ED at this time prior to discharge.   Derriona Branscom L. Logyn Kendrick, [place] 07/13/12 1045

## 2012-07-12 NOTE — ED Notes (Signed)
Pt reports that his catheter leg bag "won't collect any water." since yesterday. Pt denies pain.

## 2012-07-14 LAB — URINE CULTURE

## 2012-07-14 NOTE — ED Provider Notes (Signed)
Medical screening examination/treatment/procedure(s) were performed by non-physician practitioner and as supervising physician I was immediately available for consultation/collaboration. Devoria Albe, MD, FACEP   Ward Givens, MD 07/14/12 [phone]

## 2012-07-15 ENCOUNTER — Ambulatory Visit (INDEPENDENT_AMBULATORY_CARE_PROVIDER_SITE_OTHER): Payer: Medicare Other | Admitting: Urology

## 2012-07-15 DIAGNOSIS — R339 Retention of urine, unspecified: Secondary | ICD-10-CM

## 2012-07-15 NOTE — ED Notes (Signed)
+   Urine Chart sent to EDP office for review. 

## 2012-07-19 NOTE — ED Notes (Signed)
Rx for bactrim DS 1 tab po BID x 5 days need  to be called to pharmacy per Johnnette Gourd.I have attempted to contact this patient by phone, but line is busy.  I will continue to try later.

## 2012-07-24 NOTE — ED Notes (Signed)
Attempted to contact patient. No answer. 

## 2012-08-05 ENCOUNTER — Ambulatory Visit (INDEPENDENT_AMBULATORY_CARE_PROVIDER_SITE_OTHER): Payer: Medicare Other | Admitting: Urology

## 2012-08-05 DIAGNOSIS — N3010 Interstitial cystitis (chronic) without hematuria: Secondary | ICD-10-CM

## 2012-08-05 DIAGNOSIS — R350 Frequency of micturition: Secondary | ICD-10-CM

## 2012-09-06 NOTE — ED Notes (Signed)
No response after 30 days. Chart appended and sent to Medical Records. 

## 2012-09-09 ENCOUNTER — Ambulatory Visit (INDEPENDENT_AMBULATORY_CARE_PROVIDER_SITE_OTHER): Payer: Medicare Other | Admitting: Urology

## 2012-09-09 DIAGNOSIS — R339 Retention of urine, unspecified: Secondary | ICD-10-CM

## 2012-09-22 ENCOUNTER — Encounter (HOSPITAL_COMMUNITY): Payer: Self-pay | Admitting: Emergency Medicine

## 2012-09-22 ENCOUNTER — Emergency Department (HOSPITAL_COMMUNITY)
Admission: EM | Admit: 2012-09-22 | Discharge: 2012-09-22 | Disposition: A | Discharge status: Home / Self Care | Payer: Medicare Other | Attending: Emergency Medicine | Admitting: Emergency Medicine

## 2012-09-22 DIAGNOSIS — R339 Retention of urine, unspecified: Secondary | ICD-10-CM

## 2012-09-22 DIAGNOSIS — Z87891 Personal history of nicotine dependence: Secondary | ICD-10-CM | POA: Insufficient documentation

## 2012-09-22 DIAGNOSIS — Z8739 Personal history of other diseases of the musculoskeletal system and connective tissue: Secondary | ICD-10-CM | POA: Insufficient documentation

## 2012-09-22 DIAGNOSIS — N400 Enlarged prostate without lower urinary tract symptoms: Secondary | ICD-10-CM | POA: Insufficient documentation

## 2012-09-22 DIAGNOSIS — Z8669 Personal history of other diseases of the nervous system and sense organs: Secondary | ICD-10-CM | POA: Insufficient documentation

## 2012-09-22 DIAGNOSIS — N390 Urinary tract infection, site not specified: Secondary | ICD-10-CM | POA: Insufficient documentation

## 2012-09-22 DIAGNOSIS — Z79899 Other long term (current) drug therapy: Secondary | ICD-10-CM | POA: Insufficient documentation

## 2012-09-22 DIAGNOSIS — I4891 Unspecified atrial fibrillation: Secondary | ICD-10-CM | POA: Insufficient documentation

## 2012-09-22 LAB — URINALYSIS, ROUTINE W REFLEX MICROSCOPIC
Nitrite: POSITIVE — AB
Specific Gravity, Urine: 1.02 (ref 1.005–1.030)
Urobilinogen, UA: 4 mg/dL — ABNORMAL HIGH (ref 0.0–1.0)
pH: 7 (ref 5.0–8.0)

## 2012-09-22 LAB — URINE MICROSCOPIC-ADD ON

## 2012-09-22 MED ORDER — CEPHALEXIN 500 MG PO CAPS: 500.0000 mg | ORAL_CAPSULE | Freq: Four times a day (QID) | ORAL | Status: DC

## 2012-09-22 NOTE — ED Provider Notes (Signed)
History   This chart was scribed for Vincent Octave, MD by Charolett Bumpers . The patient was seen in room APA15/APA15. Patient's care was started at 1056.   CSN: [id number]  Arrival date & time 09/22/12  1020   First MD Initiated Contact with Patient 09/22/12 1056      Chief Complaint  Patient presents with  . Penis Pain  . Urinary Retention    The history is provided by the patient. No language interpreter was used.   Vincent [name] is a 78 y.o. male who presents to the Emergency Department complaining of constant problems with his suprapubic catheter. He states that the catheter hasn't been functioning proper since it was last changed. He is unsure of the last time it was changed. He states urine comes out of his penis and doesn't going into bag. He denies any fever, emesis, abdominal pain.    Past Medical History  Diagnosis Date  . Urine retention   . Atrial fibrillation   . Lumbar spondylosis   . Prostate hypertrophy   . Arachnoid cyst     Past Surgical History  Procedure Date  . Suprapubic catheter placement   . Transurethral resection of prostate   . Renal abscess drainage     History reviewed. No pertinent family history.  History  Substance Use Topics  . Smoking status: Former Games developer  . Smokeless tobacco: Not on file  . Alcohol Use: No      Review of Systems A complete 10 system review of systems was obtained and all systems are negative except as noted in the HPI and PMH.   Allergies  Review of patient's allergies indicates no known allergies.  Home Medications   Current Outpatient Rx  Name  Route  Sig  Dispense  Refill  . ALBUTEROL SULFATE HFA 108 (90 BASE) MCG/ACT IN AERS   Inhalation   Inhale 2 puffs into the lungs every 6 (six) hours as needed. For asthma         . BUDESONIDE-FORMOTEROL FUMARATE 80-4.5 MCG/ACT IN AERO   Inhalation   Inhale 2 puffs into the lungs 2 (two) times daily as needed. Shortness of Breath          .  OXYCODONE-ACETAMINOPHEN 5-325 MG PO TABS   Oral   Take 1 tablet by mouth every 4 (four) hours as needed. Pain         . CEPHALEXIN 500 MG PO CAPS   Oral   Take 1 capsule (500 mg total) by mouth 4 (four) times daily.   40 capsule   0     BP 130/107  Pulse 87  Temp 97.4 F (36.3 C) (Oral)  Resp 20  Ht 5\' 6"  (1.676 m)  Wt 260 lb (117.935 kg)  BMI 41.97 kg/m2  SpO2 93%  Physical Exam  Nursing note and vitals reviewed. Constitutional: He is oriented to person, place, and time. He appears well-developed and well-nourished. No distress.  HENT:  Head: Normocephalic and atraumatic.  Eyes: EOM are normal. Pupils are equal, round, and reactive to light.  Neck: Normal range of motion. Neck supple. No tracheal deviation present.  Cardiovascular: Normal rate, regular rhythm and normal heart sounds.   No murmur heard. Pulmonary/Chest: Effort normal and breath sounds normal. No respiratory distress. He has no wheezes.  Abdominal: Soft. Bowel sounds are normal. He exhibits no distension. There is no tenderness.  Genitourinary: No penile tenderness.       Suprapubic catheter in place, appears to  be clean. Small amount of urine in foley bag. Small amount of urine dribbling from penis.   Musculoskeletal: Normal range of motion. He exhibits no edema.  Neurological: He is alert and oriented to person, place, and time.  Skin: Skin is warm and dry.  Psychiatric: He has a normal mood and affect. His behavior is normal.    ED Course  SUPRAPUBIC TUBE PLACEMENT Date/Time: 09/22/2012 12:59 PM Performed by: Vincent [name] Authorized by: Vincent [name] Consent: Verbal consent obtained. Risks and benefits: risks, benefits and alternatives were discussed Consent given by: patient Patient understanding: patient states understanding of the procedure being performed Indications: urinary retention Local anesthesia used: no Patient sedated: no Preparation: Patient was prepped and draped in the  usual sterile fashion. Suprapubic aspiration by: catheter Catheter type: Foley Catheter size: 22 Fr Number of attempts: 1 Urine characteristics: blood-tinged Patient tolerance: Patient tolerated the procedure well with no immediate complications.   (including critical care time)  DIAGNOSTIC STUDIES: Oxygen Saturation is 93% on room air, adeqaute by my interpretation.    COORDINATION OF CARE:  11:08-Discussed planned course of treatment with the patient including a UA and irrigating foley, who is agreeable at this time.     Labs Reviewed  URINALYSIS, ROUTINE W REFLEX MICROSCOPIC - Abnormal; Notable for the following:    Color, Urine RED (*)  BIOCHEMICALS MAY BE AFFECTED BY COLOR   APPearance CLOUDY (*)     Glucose, UA 100 (*)     Hgb urine dipstick LARGE (*)     Bilirubin Urine MODERATE (*)     Ketones, ur TRACE (*)     Protein, ur >300 (*)     Urobilinogen, UA 4.0 (*)     Nitrite POSITIVE (*)     Leukocytes, UA MODERATE (*)     All other components within normal limits  URINE MICROSCOPIC-ADD ON - Abnormal; Notable for the following:    Bacteria, UA MANY (*)     All other components within normal limits  URINE CULTURE   No results found.   1. Urinary retention   2. Urinary tract infection       MDM  Patient with history of superpubic catheter well known to ED. Catheter has not been functioning for the past one day. Leaking around penis and not emptying into foley bag.  Abdomen soft and nontender. Nursing staff unable to urinate catheter. They had difficulty removing it. I was able to remove catheter replaced as above. We'll treat possible UTI, culture sent, patient needs followup with his urologist though he states the ER is his urology office. New catheter draining well on discharge.   I personally performed the services described in this documentation, which was scribed in my presence.  The recorded information has been reviewed and  considered.       Vincent Octave, MD 09/22/12 1540

## 2012-09-22 NOTE — ED Notes (Signed)
Dr. Rancour at bedside for evaluation.  

## 2012-09-22 NOTE — ED Notes (Signed)
Attempted to irrigate foley. RN met resistance with flush and was unable to irrigate. Dr Manus Gunning aware.

## 2012-09-22 NOTE — ED Notes (Signed)
Pt c/o penis burning since catheter was placed, he states the catheter is not working.

## 2012-09-22 NOTE — ED Notes (Signed)
Attempted to replace suprapubic catheter. Balloon deflated, attempted to remove catheter, resistance met so attempt at removal stopped. Dr Manus Gunning made aware and will attempt removal.

## 2012-09-22 NOTE — ED Notes (Signed)
Patient with no complaints at this time. Respirations even and unlabored. Skin warm/dry. Discharge instructions reviewed with patient at this time. Patient given opportunity to voice concerns/ask questions. Patient discharged at this time and left Emergency Department with steady gait.   

## 2012-09-24 LAB — URINE CULTURE

## 2012-09-25 NOTE — ED Notes (Signed)
+  Urine. Patient treated with Keflex. Sensitive to same. Per protocol MD. °

## 2012-09-30 ENCOUNTER — Ambulatory Visit: Payer: Medicare Other | Admitting: Urology

## 2012-10-11 ENCOUNTER — Ambulatory Visit: Payer: Medicare Other | Admitting: Urology

## 2012-10-18 ENCOUNTER — Ambulatory Visit (INDEPENDENT_AMBULATORY_CARE_PROVIDER_SITE_OTHER): Payer: Medicare Other | Admitting: Urology

## 2012-10-18 DIAGNOSIS — R339 Retention of urine, unspecified: Secondary | ICD-10-CM

## 2012-11-04 ENCOUNTER — Ambulatory Visit: Payer: Medicare Other | Admitting: Urology

## 2012-11-22 ENCOUNTER — Ambulatory Visit: Payer: Medicare Other | Admitting: Urology

## 2012-11-25 ENCOUNTER — Ambulatory Visit (INDEPENDENT_AMBULATORY_CARE_PROVIDER_SITE_OTHER): Payer: Medicare Other | Admitting: Urology

## 2012-11-25 DIAGNOSIS — R339 Retention of urine, unspecified: Secondary | ICD-10-CM

## 2012-12-27 ENCOUNTER — Ambulatory Visit (INDEPENDENT_AMBULATORY_CARE_PROVIDER_SITE_OTHER): Payer: Medicare Other | Admitting: Urology

## 2012-12-27 DIAGNOSIS — R339 Retention of urine, unspecified: Secondary | ICD-10-CM

## 2013-01-24 ENCOUNTER — Ambulatory Visit (INDEPENDENT_AMBULATORY_CARE_PROVIDER_SITE_OTHER): Payer: Medicare Other | Admitting: Urology

## 2013-01-24 DIAGNOSIS — R32 Unspecified urinary incontinence: Secondary | ICD-10-CM

## 2013-02-28 ENCOUNTER — Ambulatory Visit (INDEPENDENT_AMBULATORY_CARE_PROVIDER_SITE_OTHER): Payer: Medicare Other | Admitting: Urology

## 2013-02-28 DIAGNOSIS — R339 Retention of urine, unspecified: Secondary | ICD-10-CM

## 2013-03-17 ENCOUNTER — Ambulatory Visit: Payer: Medicare Other | Admitting: Urology

## 2013-03-17 ENCOUNTER — Ambulatory Visit (INDEPENDENT_AMBULATORY_CARE_PROVIDER_SITE_OTHER): Payer: Medicare Other | Admitting: Urology

## 2013-03-17 DIAGNOSIS — R339 Retention of urine, unspecified: Secondary | ICD-10-CM

## 2013-04-04 ENCOUNTER — Ambulatory Visit (INDEPENDENT_AMBULATORY_CARE_PROVIDER_SITE_OTHER): Payer: Medicare Other | Admitting: Urology

## 2013-04-04 DIAGNOSIS — R339 Retention of urine, unspecified: Secondary | ICD-10-CM

## 2013-04-17 ENCOUNTER — Emergency Department (HOSPITAL_COMMUNITY): Payer: Medicare Other

## 2013-04-17 ENCOUNTER — Inpatient Hospital Stay (HOSPITAL_COMMUNITY)
Admission: EM | Admit: 2013-04-17 | Discharge: 2013-04-19 | DRG: 698 | Disposition: A | Discharge status: Home Health | Payer: Medicare Other | Attending: Internal Medicine | Admitting: Internal Medicine

## 2013-04-17 ENCOUNTER — Encounter (HOSPITAL_COMMUNITY): Payer: Self-pay | Admitting: Emergency Medicine

## 2013-04-17 DIAGNOSIS — N401 Enlarged prostate with lower urinary tract symptoms: Secondary | ICD-10-CM | POA: Diagnosis present

## 2013-04-17 DIAGNOSIS — J4489 Other specified chronic obstructive pulmonary disease: Secondary | ICD-10-CM | POA: Diagnosis present

## 2013-04-17 DIAGNOSIS — M47817 Spondylosis without myelopathy or radiculopathy, lumbosacral region: Secondary | ICD-10-CM | POA: Diagnosis present

## 2013-04-17 DIAGNOSIS — E669 Obesity, unspecified: Secondary | ICD-10-CM | POA: Diagnosis present

## 2013-04-17 DIAGNOSIS — N138 Other obstructive and reflux uropathy: Secondary | ICD-10-CM | POA: Diagnosis present

## 2013-04-17 DIAGNOSIS — I10 Essential (primary) hypertension: Secondary | ICD-10-CM | POA: Diagnosis present

## 2013-04-17 DIAGNOSIS — B372 Candidiasis of skin and nail: Secondary | ICD-10-CM | POA: Diagnosis present

## 2013-04-17 DIAGNOSIS — T83511A Infection and inflammatory reaction due to indwelling urethral catheter, initial encounter: Secondary | ICD-10-CM | POA: Diagnosis present

## 2013-04-17 DIAGNOSIS — J449 Chronic obstructive pulmonary disease, unspecified: Secondary | ICD-10-CM | POA: Diagnosis present

## 2013-04-17 DIAGNOSIS — I4891 Unspecified atrial fibrillation: Secondary | ICD-10-CM | POA: Diagnosis present

## 2013-04-17 DIAGNOSIS — A419 Sepsis, unspecified organism: Secondary | ICD-10-CM | POA: Diagnosis present

## 2013-04-17 DIAGNOSIS — R319 Hematuria, unspecified: Secondary | ICD-10-CM | POA: Diagnosis present

## 2013-04-17 DIAGNOSIS — Z87891 Personal history of nicotine dependence: Secondary | ICD-10-CM | POA: Diagnosis not present

## 2013-04-17 DIAGNOSIS — Y846 Urinary catheterization as the cause of abnormal reaction of the patient, or of later complication, without mention of misadventure at the time of the procedure: Secondary | ICD-10-CM | POA: Diagnosis present

## 2013-04-17 DIAGNOSIS — R0902 Hypoxemia: Secondary | ICD-10-CM | POA: Diagnosis present

## 2013-04-17 DIAGNOSIS — R339 Retention of urine, unspecified: Secondary | ICD-10-CM | POA: Diagnosis present

## 2013-04-17 DIAGNOSIS — J9610 Chronic respiratory failure, unspecified whether with hypoxia or hypercapnia: Secondary | ICD-10-CM | POA: Diagnosis present

## 2013-04-17 DIAGNOSIS — Z6841 Body Mass Index (BMI) 40.0 and over, adult: Secondary | ICD-10-CM

## 2013-04-17 DIAGNOSIS — R6510 Systemic inflammatory response syndrome (SIRS) of non-infectious origin without acute organ dysfunction: Secondary | ICD-10-CM | POA: Diagnosis present

## 2013-04-17 DIAGNOSIS — I509 Heart failure, unspecified: Secondary | ICD-10-CM | POA: Insufficient documentation

## 2013-04-17 DIAGNOSIS — N390 Urinary tract infection, site not specified: Secondary | ICD-10-CM | POA: Diagnosis present

## 2013-04-17 DIAGNOSIS — Z87898 Personal history of other specified conditions: Secondary | ICD-10-CM

## 2013-04-17 HISTORY — DX: Sepsis, unspecified organism: A41.9

## 2013-04-17 HISTORY — DX: Urinary tract infection, site not specified: N39.0

## 2013-04-17 HISTORY — DX: Essential (primary) hypertension: I10

## 2013-04-17 HISTORY — DX: Heart failure, unspecified: I50.9

## 2013-04-17 LAB — BLOOD GAS, ARTERIAL
Acid-Base Excess: 3.4 mmol/L — ABNORMAL HIGH (ref 0.0–2.0)
O2 Saturation: 92.9 %
Patient temperature: 37

## 2013-04-17 LAB — COMPREHENSIVE METABOLIC PANEL
ALT: 19 U/L (ref 0–53)
AST: 16 U/L (ref 0–37)
Alkaline Phosphatase: 63 U/L (ref 39–117)
CO2: 31 mEq/L (ref 19–32)
Calcium: 9.4 mg/dL (ref 8.4–10.5)
GFR calc Af Amer: 87 mL/min — ABNORMAL LOW (ref >90)
GFR calc non Af Amer: 75 mL/min — ABNORMAL LOW (ref >90)
Glucose, Bld: 245 mg/dL — ABNORMAL HIGH (ref 70–99)
Potassium: 4.1 mEq/L (ref 3.5–5.1)
Sodium: 134 mEq/L — ABNORMAL LOW (ref 135–145)
Total Protein: 6.8 g/dL (ref 6.0–8.3)

## 2013-04-17 LAB — URINALYSIS, ROUTINE W REFLEX MICROSCOPIC
Bilirubin Urine: NEGATIVE
Ketones, ur: NEGATIVE mg/dL
Nitrite: POSITIVE — AB
Specific Gravity, Urine: 1.01 (ref 1.005–1.030)
pH: 6.5 (ref 5.0–8.0)

## 2013-04-17 LAB — CBC WITH DIFFERENTIAL/PLATELET
Basophils Relative: 0 % (ref 0–1)
HCT: 43.2 % (ref 39.0–52.0)
Hemoglobin: 15 g/dL (ref 13.0–17.0)
Lymphocytes Relative: 5 % — ABNORMAL LOW (ref 12–46)
MCHC: 34.7 g/dL (ref 30.0–36.0)
Monocytes Absolute: 1.7 10*3/uL — ABNORMAL HIGH (ref 0.1–1.0)
Monocytes Relative: 10 % (ref 3–12)
Neutro Abs: 14.1 10*3/uL — ABNORMAL HIGH (ref 1.7–7.7)
Neutrophils Relative %: 85 % — ABNORMAL HIGH (ref 43–77)
RBC: 4.92 MIL/uL (ref 4.22–5.81)
WBC: 16.6 10*3/uL — ABNORMAL HIGH (ref 4.0–10.5)

## 2013-04-17 LAB — URINE MICROSCOPIC-ADD ON

## 2013-04-17 LAB — MRSA PCR SCREENING: MRSA by PCR: NEGATIVE

## 2013-04-17 MED ORDER — SODIUM CHLORIDE 0.9 % IV SOLN
1000.0000 mL | INTRAVENOUS | Status: DC
  Administered 2013-04-17 (×2): 1000 mL via INTRAVENOUS

## 2013-04-17 MED ORDER — HYDROCODONE-ACETAMINOPHEN 5-325 MG PO TABS: 1.0000 | ORAL_TABLET | ORAL | Status: DC | PRN

## 2013-04-17 MED ORDER — VANCOMYCIN HCL IN DEXTROSE 1-5 GM/200ML-% IV SOLN
1000.0000 mg | Freq: Two times a day (BID) | INTRAVENOUS | Status: DC
  Filled 2013-04-17 (×2): qty 200

## 2013-04-17 MED ORDER — VANCOMYCIN HCL IN DEXTROSE 1-5 GM/200ML-% IV SOLN
1000.0000 mg | Freq: Once | INTRAVENOUS | Status: AC
  Administered 2013-04-17: 1000 mg via INTRAVENOUS

## 2013-04-17 MED ORDER — BIOTENE DRY MOUTH MT LIQD
15.0000 mL | Freq: Two times a day (BID) | OROMUCOSAL | Status: DC
  Administered 2013-04-17 – 2013-04-19 (×2): 15 mL via OROMUCOSAL

## 2013-04-17 MED ORDER — ENOXAPARIN SODIUM 40 MG/0.4ML ~~LOC~~ SOLN
40.0000 mg | SUBCUTANEOUS | Status: DC
  Administered 2013-04-17 – 2013-04-18 (×2): 40 mg via SUBCUTANEOUS
  Filled 2013-04-17 (×2): qty 0.4

## 2013-04-17 MED ORDER — PIPERACILLIN-TAZOBACTAM 3.375 G IVPB
3.3750 g | Freq: Three times a day (TID) | INTRAVENOUS | Status: DC
  Filled 2013-04-17 (×3): qty 50

## 2013-04-17 MED ORDER — HYDROMORPHONE HCL PF 1 MG/ML IJ SOLN: 0.5000 mg | INTRAMUSCULAR | Status: DC | PRN

## 2013-04-17 MED ORDER — PIPERACILLIN-TAZOBACTAM 3.375 G IVPB 30 MIN: 3.3750 g | Freq: Once | INTRAVENOUS | Status: DC

## 2013-04-17 MED ORDER — ALBUTEROL SULFATE HFA 108 (90 BASE) MCG/ACT IN AERS
2.0000 | INHALATION_SPRAY | Freq: Four times a day (QID) | RESPIRATORY_TRACT | Status: DC | PRN
Start: 2013-04-17 — End: 2013-04-19

## 2013-04-17 MED ORDER — BUDESONIDE-FORMOTEROL FUMARATE 80-4.5 MCG/ACT IN AERO
2.0000 | INHALATION_SPRAY | Freq: Two times a day (BID) | RESPIRATORY_TRACT | Status: DC | PRN
  Filled 2013-04-17: qty 6.9

## 2013-04-17 MED ORDER — ALBUTEROL SULFATE (5 MG/ML) 0.5% IN NEBU
INHALATION_SOLUTION | RESPIRATORY_TRACT | Status: AC
  Administered 2013-04-17: 2.5 mg via RESPIRATORY_TRACT
  Filled 2013-04-17: qty 0.5

## 2013-04-17 MED ORDER — DEXTROSE 5 % IV SOLN
1.0000 g | INTRAVENOUS | Status: DC
  Administered 2013-04-17 – 2013-04-18 (×2): 1 g via INTRAVENOUS
  Filled 2013-04-17 (×2): qty 10

## 2013-04-17 MED ORDER — LORAZEPAM 2 MG/ML IJ SOLN
0.5000 mg | Freq: Four times a day (QID) | INTRAMUSCULAR | Status: DC | PRN
  Administered 2013-04-17 – 2013-04-18 (×3): 0.5 mg via INTRAVENOUS
  Filled 2013-04-17 (×3): qty 1

## 2013-04-17 MED ORDER — PIPERACILLIN-TAZOBACTAM 3.375 G IVPB
3.3750 g | Freq: Once | INTRAVENOUS | Status: AC
  Administered 2013-04-17: 3.375 g via INTRAVENOUS
  Filled 2013-04-17: qty 50

## 2013-04-17 MED ORDER — SODIUM CHLORIDE 0.9 % IJ SOLN
3.0000 mL | Freq: Two times a day (BID) | INTRAMUSCULAR | Status: DC
  Administered 2013-04-17 – 2013-04-18 (×3): 3 mL via INTRAVENOUS

## 2013-04-17 MED ORDER — ALBUTEROL SULFATE (5 MG/ML) 0.5% IN NEBU: 2.5000 mg | INHALATION_SOLUTION | Freq: Once | RESPIRATORY_TRACT | Status: AC

## 2013-04-17 MED ORDER — VANCOMYCIN HCL IN DEXTROSE 1-5 GM/200ML-% IV SOLN
1000.0000 mg | Freq: Once | INTRAVENOUS | Status: AC
  Administered 2013-04-17: 1000 mg via INTRAVENOUS
  Filled 2013-04-17: qty 200

## 2013-04-17 MED ORDER — ACETAMINOPHEN 650 MG RE SUPP: 650.0000 mg | Freq: Four times a day (QID) | RECTAL | Status: DC | PRN

## 2013-04-17 MED ORDER — ONDANSETRON HCL 4 MG/2ML IJ SOLN: 4.0000 mg | Freq: Four times a day (QID) | INTRAMUSCULAR | Status: DC | PRN

## 2013-04-17 MED ORDER — ONDANSETRON HCL 4 MG/2ML IJ SOLN
4.0000 mg | Freq: Once | INTRAMUSCULAR | Status: AC
  Administered 2013-04-17: 4 mg via INTRAVENOUS
  Filled 2013-04-17: qty 2

## 2013-04-17 MED ORDER — ONDANSETRON HCL 4 MG PO TABS: 4.0000 mg | ORAL_TABLET | Freq: Four times a day (QID) | ORAL | Status: DC | PRN

## 2013-04-17 MED ORDER — ACETAMINOPHEN 325 MG PO TABS
650.0000 mg | ORAL_TABLET | Freq: Four times a day (QID) | ORAL | Status: DC | PRN
  Administered 2013-04-17 – 2013-04-18 (×2): 650 mg via ORAL
  Filled 2013-04-17 (×2): qty 2

## 2013-04-17 MED ORDER — NYSTATIN 100000 UNIT/GM EX POWD
Freq: Three times a day (TID) | CUTANEOUS | Status: DC
  Administered 2013-04-17: 1 via TOPICAL
  Administered 2013-04-17 – 2013-04-19 (×5): via TOPICAL
  Filled 2013-04-17: qty 15

## 2013-04-17 MED ORDER — SODIUM CHLORIDE 0.9 % IV SOLN
1000.0000 mL | Freq: Once | INTRAVENOUS | Status: AC
  Administered 2013-04-17: 1000 mL via INTRAVENOUS

## 2013-04-17 MED ORDER — ALUM & MAG HYDROXIDE-SIMETH 200-200-20 MG/5ML PO SUSP: 30.0000 mL | Freq: Four times a day (QID) | ORAL | Status: DC | PRN

## 2013-04-17 MED ORDER — SENNOSIDES-DOCUSATE SODIUM 8.6-50 MG PO TABS: 1.0000 | ORAL_TABLET | Freq: Every evening | ORAL | Status: DC | PRN

## 2013-04-17 NOTE — Progress Notes (Signed)
Spoke with Rosey Bath, pts POA. Asked her to bring paperwork in to put on pts chart. Rosey Bath also mentioned that [facility]. Has started coming out to patient home.

## 2013-04-17 NOTE — Progress Notes (Addendum)
ANTIBIOTIC CONSULT NOTE - INITIAL  Pharmacy Consult for Vancomycin & Zosyn Indication: rule out sepsis  No Known Allergies  Patient Measurements: Height: 5\' 6"  (167.6 cm) Weight: 260 lb (117.935 kg) IBW/kg (Calculated) : 63.8  Vital Signs: Temp: 100.8 F (38.2 C) (06/02 0818) Temp src: Rectal (06/02 0818) BP: 145/51 mmHg (06/02 1100) Pulse Rate: 69 (06/02 1145) Intake/Output from previous day:   Intake/Output from this shift:    Labs:  Recent Labs  04/17/13 0825  WBC 16.6*  HGB 15.0  PLT 157  CREATININE 0.94   Estimated Creatinine Clearance: 71.9 ml/min (by C-G formula based on Cr of 0.94). No results found for this basename: VANCOTROUGH, Leodis Binet, VANCORANDOM, GENTTROUGH, GENTPEAK, GENTRANDOM, TOBRATROUGH, TOBRAPEAK, TOBRARND, AMIKACINPEAK, AMIKACINTROU, AMIKACIN,  in the last 72 hours   Microbiology: Recent Results (from the past 720 hour(s))  CULTURE, BLOOD (ROUTINE X 2)     Status: None   Collection Time    04/17/13  8:25 AM      Result Value Range Status   Specimen Description Blood   Final   Special Requests NONE   Final   Culture NO GROWTH <24 HRS   Final   Report Status PENDING   Incomplete  CULTURE, BLOOD (ROUTINE X 2)     Status: None   Collection Time    04/17/13  8:38 AM      Result Value Range Status   Specimen Description Blood   Final   Special Requests NONE   Final   Culture NO GROWTH <24 HRS   Final   Report Status PENDING   Incomplete    Medical History: Past Medical History  Diagnosis Date  . Urine retention   . Atrial fibrillation   . Lumbar spondylosis   . Prostate hypertrophy   . Arachnoid cyst   . Hypertension   . CHF (congestive heart failure)     Medications:  Scheduled:    Assessment: 79 yo obese M admitted with weakness, fevers, and elevated, WBC & lactic acid levels.  Started on empiric Zosyn & Vancomycin for urosepsis.   Renal function is at patient's baseline.   Goal of Therapy:  Vancomycin trough level 15-20  mcg/ml  Plan:  1) Zosyn 3.375gm IV Q8h to be infused over 4hrs 2) Vancomycin 2gm IV x1 loading dose then 1gm IV q12h 3) Check Vancomycin trough at steady state 4) Monitor renal function and cx data   Elson Clan 04/17/2013,11:55 AM  Addendum:  Admission orders to Change Vanc/Zosyn to Rocephin per Pharmacy. 1) Rocephin 1gm IV q24h Junita Push, PharmD, BCPS 04/17/2013@2 :27 PM

## 2013-04-17 NOTE — Progress Notes (Signed)
Ativan 0.5mg  iv given for restlessness and agitation, patient continues to remove gown with threat of pulling out iv again.

## 2013-04-17 NOTE — ED Provider Notes (Signed)
History     This chart was scribed for Vincent Quarry, MD, MD by Vincent [name], ED Scribe. The patient was seen in room APA14/APA14 and the patient's care was started at 8:13 AM.   CSN: [id number]  Arrival date & time 04/17/13  0749      Chief Complaint  Patient presents with  . Fall  . Shortness of Breath    Patient is a 79 y.o. male presenting with fall and shortness of breath. The history is provided by the patient and medical records. No language interpreter was used.  Fall The current episode started 3 to 5 hours ago. The problem occurs rarely. The problem has been resolved. Associated symptoms include shortness of breath. Nothing aggravates the symptoms. Nothing relieves the symptoms. He has tried nothing for the symptoms.  Shortness of Breath Associated symptoms: no cough and no vomiting    HPI Comments: Vincent [name] is a 79 y.o. male who presents to the Emergency Department BIB EMS complaining of fall today. Pt is unsure how he fell in his home. He states he called neighbor to help him up. His neighbor arrived 1.5 hours later finding pt in the floor. Neighbor states that pt called him and told him that he was not hungry so do not bring any breakfast and he had SOB. Pt mentions that he has not eaten within the past 2 days. He states he is not sure why he has not eaten. He states that he lives alone. Pt has nurse visit him once/week. Neighbor states that he talked to pt yesterday and brought him food and he was at baseline. Pt denies fever, chills, nausea, vomiting, diarrhea, weakness, cough and any other pain. Neighbor reports that pt's urostomy bag was flushed 2 days ago. Pt denies smoking cigarettes and drinking alcohol.    PCP is Dr. Charm [name]   Past Medical History  Diagnosis Date  . Urine retention   . Atrial fibrillation   . Lumbar spondylosis   . Prostate hypertrophy   . Arachnoid cyst   . Hypertension   . CHF (congestive heart failure)     Past Surgical History   Procedure Laterality Date  . Suprapubic catheter placement    . Transurethral resection of prostate    . Renal abscess drainage      History reviewed. No pertinent family history.  History  Substance Use Topics  . Smoking status: Former Games developer  . Smokeless tobacco: Not on file  . Alcohol Use: No      Review of Systems  Respiratory: Positive for shortness of breath. Negative for cough.   Gastrointestinal: Negative for nausea and vomiting.  All other systems reviewed and are negative.    Allergies  Review of patient's allergies indicates no known allergies.  Home Medications   Current Outpatient Rx  Name  Route  Sig  Dispense  Refill  . albuterol (PROVENTIL HFA;VENTOLIN HFA) 108 (90 BASE) MCG/ACT inhaler   Inhalation   Inhale 2 puffs into the lungs every 6 (six) hours as needed. For asthma         . budesonide-formoterol (SYMBICORT) 80-4.5 MCG/ACT inhaler   Inhalation   Inhale 2 puffs into the lungs 2 (two) times daily as needed. Shortness of Breath          . oxyCODONE-acetaminophen (PERCOCET/ROXICET) 5-325 MG per tablet   Oral   Take 1 tablet by mouth every 4 (four) hours as needed. Pain  BP 136/66  Pulse 70  Temp(Src) 98.2 F (36.8 C) (Oral)  Resp 20  Ht 5\' 6"  (1.676 m)  Wt 260 lb (117.935 kg)  BMI 41.99 kg/m2  SpO2 94%  Physical Exam  Nursing note and vitals reviewed. Constitutional: He appears well-developed and well-nourished. No distress.  HENT:  Head: Normocephalic and atraumatic.  Eyes: Conjunctivae are normal. Pupils are equal, round, and reactive to light.  Neck: Normal range of motion. Neck supple.  Cardiovascular: Normal rate, regular rhythm and normal heart sounds.   No murmur heard. Pulmonary/Chest: Effort normal. No respiratory distress. He has wheezes.  Abdominal: Soft. He exhibits no distension. There is no tenderness. There is no rebound.  Neurological: He is alert. No cranial nerve deficit.  Oriented to place not  time.   Skin: Skin is warm and dry.    ED Course  Procedures (including critical care time) DIAGNOSTIC STUDIES: Oxygen Saturation is 94% on room air, adequate by my interpretation.    COORDINATION OF CARE: 8:19 AM Discussed ED treatment with pt and pt agrees.  8:27 AM Ordered:  Medications  0.9 %  sodium chloride infusion (1,000 mLs Intravenous New Bag/Given 04/17/13 0913)    Followed by  0.9 %  sodium chloride infusion (not administered)  vancomycin (VANCOCIN) IVPB 1000 mg/200 mL premix (not administered)  piperacillin-tazobactam (ZOSYN) IVPB 3.375 g (0 g Intravenous Stopped 04/17/13 0953)      Labs Reviewed  CBC WITH DIFFERENTIAL - Abnormal; Notable for the following:    WBC 16.6 (*)    Neutrophils Relative % 85 (*)    Neutro Abs 14.1 (*)    Lymphocytes Relative 5 (*)    Monocytes Absolute 1.7 (*)    All other components within normal limits  COMPREHENSIVE METABOLIC PANEL - Abnormal; Notable for the following:    Sodium 134 (*)    Chloride 94 (*)    Glucose, Bld 245 (*)    GFR calc non Af Amer 75 (*)    GFR calc Af Amer 87 (*)    All other components within normal limits  URINALYSIS, ROUTINE W REFLEX MICROSCOPIC - Abnormal; Notable for the following:    Glucose, UA 100 (*)    Hgb urine dipstick MODERATE (*)    Protein, ur 30 (*)    Nitrite POSITIVE (*)    Leukocytes, UA LARGE (*)    All other components within normal limits  BLOOD GAS, ARTERIAL - Abnormal; Notable for the following:    pH, Arterial 7.461 (*)    pO2, Arterial 64.7 (*)    Bicarbonate 27.0 (*)    Acid-Base Excess 3.4 (*)    All other components within normal limits  LACTIC ACID, PLASMA - Abnormal; Notable for the following:    Lactic Acid, Venous 2.6 (*)    All other components within normal limits  URINE MICROSCOPIC-ADD ON - Abnormal; Notable for the following:    Bacteria, UA MANY (*)    All other components within normal limits  CULTURE, BLOOD (ROUTINE X 2)  CULTURE, BLOOD (ROUTINE X 2)  URINE  CULTURE  PROCALCITONIN  TROPONIN I   Dg Chest Portable 1 View  04/17/2013   *RADIOLOGY REPORT*  Clinical Data: Shortness of breath  PORTABLE CHEST - 1 VIEW  Comparison: February 01, 2010.  Findings: Stable mild cardiomegaly.  No acute pulmonary disease is noted.  No pneumothorax or pleural effusion is noted. The bony thorax is grossly intact.  IMPRESSION: No acute cardiopulmonary abnormality seen.   Original Report Authenticated  By: Lupita Raider.,  M.D.     No diagnosis found.    MDM  Patient with weakness- although initial history states fall further history sounds like patient may have been too weak to get out of bed.  Patient febrile here with slightly elevated lactic acid source appears to be urine.  Patient treated with sepsis protocol originally of unknown source with zosyn and vanc.  Patient rate controlled a fib at 66, bp stable 100-115 here.       Vincent Quarry, MD 04/17/13 1048

## 2013-04-17 NOTE — ED Notes (Signed)
Caretaker Fayrene Fearing took pt's clothes home due to soiled

## 2013-04-17 NOTE — Progress Notes (Signed)
Bed alarm sounding, patient stood to side of bed with all equipment and gown taken off and IV site pulled from right arm.  Patient was irritable and confused, stated he was in [place]. Assisted back to bed, Hygiene kept as incontinence due to leaking supra pubic catheter and incontinence continues. New IV placed to Right hand 20g.  Patient tolerated well.  Remains in Atrial Fibrillation rate controlled.

## 2013-04-17 NOTE — H&P (Addendum)
[facility] History and Physical  Vincent [name] ZOX:[id number] DOB: 12-29-1927 DOA: 04/17/2013  Referring physician: rey PCP: Valetta Fuller, MD  Specialists:   Chief Complaint: shortness of breath/fever  HPI: Vincent [name] is a 79 y.o. male with past medical history of A. fib, prostate hypertrophy, hypertension, CHF, urinary retention status post urostomy tube approximately 2 years ago presents to the emergency department with chief complaint of weakness, shortness of breath, information is obtained from the patient and a friend who is at the bedside and assist with care. Patient states that about 2 days ago he began to feel ill in the sense that he had no appetite or energy. He denies any fever chills nausea or vomiting. He denies chest pain palpitations. He denies any abdominal pain nausea or vomiting. He does state that he has not had anything to eat or drink in the last 2 days as he has had no appetite. He denies any diarrhea. He states that this morning when he was getting out of bed his legs became very weak and he was unable to get out of bed. The neighbor indicates that he came to the patient's house and found the patient on the floor. Patient indicates that he slid from the bed to the floor trying to get to the bathroom. he denies any fall or loss of consciousness. The friend indicates that the patient has a urostomy tube that he has had for about a year and a half for urinary retention and that this tube gets changed monthly at Dr. Clemetine Marker office. He states that the last tube change was on May 20. He also indicates that he irrigates the tube every other day and encourages the patient to drink plenty of fluids to minimize the risk of the tube becoming clogged. He states yesterday it was very difficult to irrigate the tube. He denies any blood tinged urine and the bag. He does state that he has noticed that the patient's groin area is moist and it appears that the urostomy tube is  leaking. Therefore they decided to come to the hospital. Workup in the emergency room yields vital signs significant for a temperature of 100.8, respiratory rate of 32, blood pressure 107/92. Lab work significant for a white count of 16.6, lactic acid of 2.6, arterial blood gas with a pH of 7.4, PCO2 64.7, PCO2 38.4, bicarbonate 27.0. Chest x-ray with no acute cardiopulmonary abnormality. Urinalysis with positive nitrite and leukocytes as well as many bacteria WBCs and RBCs too numerous to count. Symptoms came on gradually have persisted and worsened. Characterized as moderate to severe. [facility] were asked to admit.    Review of Systems: The patient denies  fever, weight loss,, vision loss, decreased hearing, hoarseness, chest pain, dyspnea on exertion, peripheral edema, balance deficits, hemoptysis, abdominal pain, melena, hematochezia, severe indigestion/heartburn, hematuria, incontinence, genital sores, muscle weakness, suspicious skin lesions, transient blindness, difficulty walking, depression, unusual weight change, abnormal bleeding, enlarged lymph nodes, angioedema, and breast masses.    Past Medical History  Diagnosis Date  . Urine retention   . Atrial fibrillation   . Lumbar spondylosis   . Prostate hypertrophy   . Arachnoid cyst   . Hypertension   . CHF (congestive heart failure)   . UTI (lower urinary tract infection)   . Sepsis    Past Surgical History  Procedure Laterality Date  . Suprapubic catheter placement    . Transurethral resection of prostate    . Renal abscess drainage  Social History:  reports that he has quit smoking. He does not have any smokeless tobacco history on file. He reports that he does not drink alcohol or use illicit drugs. Patient lives alone with help from neighbors. He is independent with ADLs. He ambulates with a cane. He still drives   No Known Allergies  History reviewed. No pertinent family history. mother and father both  deceased. Patient unable to recall past medical his  Prior to Admission medications   Medication Sig Start Date End Date Taking? Authorizing Provider  albuterol (PROVENTIL HFA;VENTOLIN HFA) 108 (90 BASE) MCG/ACT inhaler Inhale 2 puffs into the lungs every 6 (six) hours as needed. For asthma   Yes Historical Provider, MD  budesonide-formoterol (SYMBICORT) 80-4.5 MCG/ACT inhaler Inhale 2 puffs into the lungs 2 (two) times daily as needed. Shortness of Breath    Yes Historical Provider, MD  furosemide (LASIX) 20 MG tablet Take 20 mg by mouth 2 (two) times daily.   Yes Historical Provider, MD  oxyCODONE-acetaminophen (PERCOCET/ROXICET) 5-325 MG per tablet Take 1 tablet by mouth every 4 (four) hours as needed. Pain   Yes Historical Provider, MD   Physical Exam: Filed Vitals:   04/17/13 1100 04/17/13 1115 04/17/13 1130 04/17/13 1145  BP: 145/51     Pulse: 76 74  69  Temp:      TempSrc:      Resp: 32 23 28 31   Height:      Weight:      SpO2: 91% 91%  92%     General:  obese somewhat ill appearing no acute distress  Eyes: PERRL, EOMI, no scleral icterus  ENT: Ears clear, nose without drainage, oropharynx without erythema or exudate. Mucous membranes of mouth pink somewhat dry. Poor dentition.  Neck: supple no JVD full range of motion no lymphadenopathy   Cardiovascular: irregularly irregular No MGR trace LE edema  Respiratory: Tachypnea mild increased work of breathing with conversation, breath sounds distant with faint expiratory wheeze diffusely. No crackles   Abdomen: obese soft positive bowel sounds, nontender to palpation, urostomy tube intact draining small amount of straw-colored urine, urostomy site slightly erythemic without drainage or odor  Skin: warm and dry, skin in pelvic area with excoriation and rash around urostomy tube. Some yeast like rash in abdominal folds   Musculoskeletal: no clubbing or cyanosis   Psychiatric: calm cooperative   Neurologic: Speech clear  facial symmetry, cranial nerves II through XII grossly intact, oriented to self and place  Labs on Admission:  Basic Metabolic Panel:  Recent Labs Lab 04/17/13 0825  NA 134*  K 4.1  CL 94*  CO2 31  GLUCOSE 245*  BUN 11  CREATININE 0.94  CALCIUM 9.4   Liver Function Tests:  Recent Labs Lab 04/17/13 0825  AST 16  ALT 19  ALKPHOS 63  BILITOT 1.2  PROT 6.8  ALBUMIN 3.7   No results found for this basename: LIPASE, AMYLASE,  in the last 168 hours No results found for this basename: AMMONIA,  in the last 168 hours CBC:  Recent Labs Lab 04/17/13 0825  WBC 16.6*  NEUTROABS 14.1*  HGB 15.0  HCT 43.2  MCV 87.8  PLT 157   Cardiac Enzymes:  Recent Labs Lab 04/17/13 0825  TROPONINI <0.30    BNP (last 3 results) No results found for this basename: PROBNP,  in the last 8760 hours CBG: No results found for this basename: GLUCAP,  in the last 168 hours  Radiological Exams on Admission: Dg Chest  Portable 1 View  04/17/2013   *RADIOLOGY REPORT*  Clinical Data: Shortness of breath  PORTABLE CHEST - 1 VIEW  Comparison: February 01, 2010.  Findings: Stable mild cardiomegaly.  No acute pulmonary disease is noted.  No pneumothorax or pleural effusion is noted. The bony thorax is grossly intact.  IMPRESSION: No acute cardiopulmonary abnormality seen.   Original Report Authenticated By: Lupita Raider.,  M.D.    EKG: Independently reviewed. A fib  Assessment/Plan Principal Problem:   UTI (lower urinary tract infection); with early sepsis, likely related to suprapubic catheter that has not been draining properly and has been leaking from the site. Will admit to step down. Will get blood cultures and urine cultures. Will start Rocephin. Will continue IV fluids appear. Will request urology consult for evaluation of suprapubic catheter. Monitor closely  Active Problems: SIRS (systemic inflammatory response syndrome): Related to #1. Will continue vigorous IV support, Rocephin. Will  check blood cultures. Currently patient is afebrile and nontoxic appearing. He is hemodynamically stable at this time. Concern for worsening at least until suprapubic catheter functioning properly  Hypoxia: Related to #2. Patient somewhat tachypneic in the emergency department however he is saturating greater than 95% on 2 L of oxygen via nasal cannula. Will continue oxygen. Will monitor closely   Urine retention: History of same in patient with PPH. He has had per pubic catheter for at least one year. This catheter gets changed monthly at the urologist office. Here gates the catheter every other day. He has a home health nurse who makes weekly visits for monitoring.    Hematuria: Per urinalysis. Chart review indicates that P. patient has history of same. Likely related to the fact he has chronic catheter. Hemoglobin stable. No frank blood. Will monitor   HYPERTENSION: Patient on Lasix at home. Will hold for now given that on admission blood pressure somewhat soft. Blood pressure has improved with IV fluids. Continue to monitor.    ATRIAL FIBRILLATION, CHRONIC: EKG shows A. fib. Rate controlled. Home meds do not currently include anticoagulant or rate control vacation. Chart review indicates pt has true allergy to coumadin. Was also on lopressor at one time but had bradycardia so this was discontinued.  Was on asa at one time.  Will monitor on telemetry.     OBESITY: nutritional consult    Requesting urology consult. Pt sees Dr. Annabell Howells  Code Status: full Family Communication: friend at bedside Disposition Plan: home when ready  Time spent: 83 minutes  Gwenyth Bender [facility] Pager [phone]  If 7PM-7AM, please contact night-coverage [email or web address] Password [facility] 04/17/2013, 12:51 PM Attending: Patient seen and examined. Above note reviewed. This man symptoms are related to a ureteric infection. He is not clinically septic/toxic. He does have also quite significant skin fungal  infection on his lower abdomen around the suprapubic catheter. Agree with intravenous Rocephin. Add  antifungal cream. The atrial fibrillation is chronic and he is not willing to go on any anticoagulation treatment.

## 2013-04-17 NOTE — ED Notes (Signed)
Pt had fall this am. States called neighbor to help him up and states didn't get there until 1.5hours later. Pt states dont know how he fell. Pt not cooperative with answers. Pt wants to make jokes and laugh instead of answering questions. Asked if he was hurting anywhere and he says "yes im hurting because im in here". Pt alert/oriented. EMS states pt was sob and 91% on RA. Was placed on 2l [place] and up to 95%. No gen weakness noted. EMS stated he get very sob with any activity.

## 2013-04-18 DIAGNOSIS — N390 Urinary tract infection, site not specified: Secondary | ICD-10-CM

## 2013-04-18 DIAGNOSIS — I4891 Unspecified atrial fibrillation: Secondary | ICD-10-CM

## 2013-04-18 DIAGNOSIS — E669 Obesity, unspecified: Secondary | ICD-10-CM

## 2013-04-18 LAB — BASIC METABOLIC PANEL
BUN: 16 mg/dL (ref 6–23)
CO2: 29 mEq/L (ref 19–32)
Calcium: 8.8 mg/dL (ref 8.4–10.5)
Creatinine, Ser: 1.04 mg/dL (ref 0.50–1.35)

## 2013-04-18 LAB — CBC
HCT: 41.8 % (ref 39.0–52.0)
MCH: 30.6 pg (ref 26.0–34.0)
MCV: 89.3 fL (ref 78.0–100.0)
Platelets: 140 10*3/uL — ABNORMAL LOW (ref 150–400)
RBC: 4.68 MIL/uL (ref 4.22–5.81)

## 2013-04-18 LAB — URINE CULTURE

## 2013-04-18 LAB — PRO B NATRIURETIC PEPTIDE: Pro B Natriuretic peptide (BNP): 3622 pg/mL — ABNORMAL HIGH (ref 0–450)

## 2013-04-18 MED ORDER — TAMSULOSIN HCL 0.4 MG PO CAPS
0.4000 mg | ORAL_CAPSULE | Freq: Every day | ORAL | Status: DC
  Administered 2013-04-18: 0.4 mg via ORAL
  Filled 2013-04-18 (×2): qty 1

## 2013-04-18 NOTE — Progress Notes (Signed)
Inpatient Diabetes Program Recommendations  AACE/ADA: New Consensus Statement on Inpatient Glycemic Control (2013)  Target Ranges:  Prepandial:   less than 140 mg/dL      Peak postprandial:   less than 180 mg/dL (1-2 hours)      Critically ill patients:  140 - 180 mg/dL   Results for HARVIR, PATRY (MRN [id number]) as of 04/18/2013 07:53  Ref. Range 04/17/2013 08:25 04/18/2013 04:38  Glucose Latest Range: 70-99 mg/dL 409 (H) 811 (H)    Inpatient Diabetes Program Recommendations Correction (SSI): Please consider ordering CBGs ACHS with Novolog correction. HgbA1C: Please consider ordering an A1C to determine glycemic control over the past 2-3 months.  Note: Patient does not have a documented history of diabetes.  However, initial blood glucose was 245 mg/dl on 07/17/46 and the fasting blood glucose this morning was 214 mg/dl.  Please consider ordering CBGs with Novolog correction ACHS and an A1C.  Will continue to follow.  Thanks, Orlando Penner, RN, MSN, CCRN Diabetes Coordinator Inpatient Diabetes Program [phone]

## 2013-04-18 NOTE — Plan of Care (Signed)
Problem: Consults Goal: UTI/Pyelonephritis Patient Education See Patient Education Module for education specifics.  Outcome: Progressing Antibiotics being given for UTI,  Suprapubic catheter remains leaking Goal: Skin Care Protocol Initiated - if Braden Score 18 or less If consults are not indicated, leave blank or document N/A  Outcome: Progressing Yeast and redness due to incontinence of infected urine, hygiene kept  Problem: Phase I Progression Outcomes Goal: Pain controlled with appropriate interventions Outcome: Completed/Met Date Met:  04/18/13 No complaints of pain Goal: OOB as tolerated unless otherwise ordered Outcome: Progressing Patient able to stand at bedside but is unsteady fall at home prior to admission Goal: Vital Signs stable- temperature less than 102 Outcome: Progressing RR remains elevated Goal: Initial discharge plan identified Outcome: Progressing Unsure of placement, from home lives alone at present

## 2013-04-18 NOTE — Clinical Social Work Note (Signed)
CSW spoke with pt at bedside. Pt alert and oriented this afternoon, knows he is on third floor of hospital and date. CSW introduced self and asked if okay to talk about d/c planning. Pt responded, "Make it short and sweet." CSW briefly discussed SNF at d/c. His response was, "That's no good." CSW also spoke with POA who agreed pt would not want to go to SNF. Pt was open to home health as well as Rosey Bath. Rosey Bath reports she is a CNA and will be off for the summer next week. Fayrene Fearing is retired. She feels that they can stay with pt around the clock until he is stronger. CSW notified CM and will sign off.  Derenda Fennel, [place] [phone]

## 2013-04-18 NOTE — Clinical Social Work Placement (Signed)
Clinical Social Work Department CLINICAL SOCIAL WORK PLACEMENT NOTE 04/18/2013  Patient:  Vincent [name], Vincent [name]  Account Number:  [id number] Admit date:  04/17/2013  Clinical Social Worker:  Derenda Fennel, LCSW  Date/time:  04/18/2013 11:25 AM  Clinical Social Work is seeking post-discharge placement for this patient at the following level of care:   SKILLED NURSING   (*CSW will update this form in Epic as items are completed)   04/18/2013  Patient/family provided with [facility] Department of Clinical Social Work's list of facilities offering this level of care within the geographic area requested by the patient (or if unable, by the patient's family).  04/18/2013  Patient/family informed of their freedom to choose among providers that offer the needed level of care, that participate in Medicare, Medicaid or managed care program needed by the patient, have an available bed and are willing to accept the patient.  04/18/2013  Patient/family informed of [facility]' ownership interest in [facility], as well as of the fact that they are under no obligation to receive care at this facility.  PASARR submitted to EDS on 04/18/2013 PASARR number received from EDS on 04/18/2013  FL2 transmitted to all facilities in geographic area requested by pt/family on  04/18/2013 FL2 transmitted to all facilities within larger geographic area on   Patient informed that his/her managed care company has contracts with or will negotiate with  certain facilities, including the following:     Patient/family informed of bed offers received:   Patient chooses bed at  Physician recommends and patient chooses bed at    Patient to be transferred to  on   Patient to be transferred to facility by   The following physician request were entered in Epic:   Additional Comments:  Derenda Fennel, LCSW [phone]

## 2013-04-18 NOTE — Clinical Social Work Psychosocial (Addendum)
Clinical Social Work Department BRIEF PSYCHOSOCIAL ASSESSMENT 04/18/2013  Patient:  Vincent [name], Vincent [name]     Account Number:  [id number]     Admit date:  04/17/2013  Clinical Social Worker:  Nancie Neas  Date/Time:  04/18/2013 11:30 AM  Referred by:  CSW  Date Referred:  04/18/2013 Referred for  SNF Placement   Other Referral:   Interview type:  Other - See comment Other interview type:   friend- Fayrene Fearing    PSYCHOSOCIAL DATA Living Status:  ALONE Admitted from facility:   Level of care:   Primary support name:  Fayrene Fearing and Rosey Bath Primary support relationship to patient:  FRIEND Degree of support available:   supportive    CURRENT CONCERNS Current Concerns  Post-Acute Placement   Other Concerns:    SOCIAL WORK ASSESSMENT / PLAN CSW spoke with the husband of pt's POA as pt is somewhat confused and POA is at work. Fayrene Fearing reports pt lives next door to them and they have been close for about 20 years. Pt's family lives in [place] and although they talk some, he does not have a close relationship with them. Pt is oriented at baseline per Fayrene Fearing. Fayrene Fearing and his wife help pt as much as needed. They do the cooking, yard work, Education officer, environmental, Architect. They check on pt multiple times per day. Pt generally ambulates with a cane. Yesterday, pt called Fayrene Fearing and told him he couldn't get up and was short of breath. Fayrene Fearing and his wife went to check on pt and called EMS. Pt has chronic suprapubic catheter. CSW discussed PT recommendations of SNF as pt was very deconditioned during evaluation. Fayrene Fearing reports he does not think pt would agree to this. However, he is trying to get in touch with his wife at work to call CSW to discuss further. Fayrene Fearing is agreeable to CSW initiating bed search until this can be completed. He is aware of Medicare coverage/criteria. SNF list left in ICU.   Assessment/plan status:  Psychosocial Support/Ongoing Assessment of Needs Other assessment/ plan:   Information/referral to  community resources:   SNF list    PATIENT'S/FAMILY'S RESPONSE TO PLAN OF CARE: Pt unable to be assessed at this time. Spoke with POA's husband who will have POA call CSW. Will follow up with bed offers when available.       Derenda Fennel, [place] [phone]

## 2013-04-18 NOTE — Progress Notes (Signed)
Report called to Darvin Neighbours, RN and pt going to room 329. Pt had dentures in mouth when he left our floor. He also took with him to 329 clothing and personal hygiene products. Transferred via wheelchair.

## 2013-04-18 NOTE — Evaluation (Signed)
Physical Therapy Evaluation Patient Details Name: Vincent [name] MRN: [id number] DOB: 03/31/1928 Today's Date: 04/18/2013 Time: 1010-1038 PT Time Calculation (min): 28 min  PT Assessment / Plan / Recommendation Clinical Impression  Pt was seen for evaluation.  He lives alone on the property of his landlord who is also a close friend.  Landlord and wife (who also has healthcare POA and is a CNA) assist with care of urostomy tube, but otherwise pt is normally independent with all ADLs and drives every day.  Currently, pt appears to be mildly confused and agitated.  He is extremely deconditioned and gait is very unstable.  It took 2 sessions in order to complete our evaluation.  I did discuss with him the need from SNF at d/c and he agreed, although I am not sure that he will remember this.    PT Assessment  Patient needs continued PT services    Follow Up Recommendations  SNF    Does the patient have the potential to tolerate intense rehabilitation      Barriers to Discharge Decreased caregiver support;Inaccessible home environment 3 steps into entrance of home    Equipment Recommendations  Rolling walker with 5" wheels    Recommendations for Other Services     Frequency Min 3X/week    Precautions / Restrictions Precautions Precautions: Fall Restrictions Weight Bearing Restrictions: No   Pertinent Vitals/Pain       Mobility  Bed Mobility Bed Mobility: Supine to Sit;Sit to Supine Supine to Sit: 3: Mod assist;HOB elevated Sit to Supine: 3: Mod assist;HOB flat Transfers Transfers: Sit to Stand;Stand to Sit Sit to Stand: 3: Mod assist;With upper extremity assist;From bed Stand to Sit: 3: Mod assist;With upper extremity assist;To chair/3-in-1 Ambulation/Gait Ambulation/Gait Assistance: 3: Mod assist Ambulation Distance (Feet): 4 Feet Assistive device: 2 person hand held assist General Gait Details: gait is very unstable but we are unable to assess gait due to short  distance walked Stairs: No Wheelchair Mobility Wheelchair Mobility: No    Exercises     PT Diagnosis: Difficulty walking;Generalized weakness  PT Problem List: Decreased strength;Decreased activity tolerance;Decreased mobility;Decreased cognition;Decreased knowledge of use of DME;Decreased safety awareness;Cardiopulmonary status limiting activity (pt very dyspneic but O2 sats in the 90s) PT Treatment Interventions:     PT Goals Acute Rehab PT Goals PT Goal Formulation: With patient Time For Goal Achievement: 05/02/13 Potential to Achieve Goals: Fair Pt will go Supine/Side to Sit: with min assist;with HOB not 0 degrees (comment degree) PT Goal: Supine/Side to Sit - Progress: Goal set today Pt will go Sit to Supine/Side: with min assist;with HOB not 0 degrees (comment degree) PT Goal: Sit to Supine/Side - Progress: Goal set today Pt will go Sit to Stand: with min assist;with upper extremity assist PT Goal: Sit to Stand - Progress: Goal set today Pt will go Stand to Sit: with supervision;with upper extremity assist PT Goal: Stand to Sit - Progress: Goal set today Pt will Ambulate: 16 - 50 feet;with min assist;with least restrictive assistive device PT Goal: Ambulate - Progress: Goal set today  Visit Information  Last PT Received On: 04/18/13    Subjective Data  Subjective: I want to get up out of bed Patient Stated Goal: return home   Prior Functioning  Home Living Lives With: Alone Available Help at Discharge: Friend(s);Available PRN/intermittently Type of Home: Mobile home Home Access: Stairs to enter Entrance Stairs-Number of Steps: 3 Entrance Stairs-Rails: Right;Left;Can reach both Home Layout: One level Bathroom Shower/Tub: Engineer, manufacturing systems: Standard  Home Adaptive Equipment: Straight cane Prior Function Level of Independence: Independent with assistive device(s) Able to Take Stairs?: Yes Driving: Yes Vocation: Retired Comments: pt ambulates with a  cane Communication Communication: No difficulties    Cognition  Cognition Arousal/Alertness: Awake/alert Behavior During Therapy: Agitated Overall Cognitive Status:  (may be mildly confused)    Extremity/Trunk Assessment Left Upper Extremity Assessment LUE ROM/Strength/Tone: WFL for tasks assessed LUE Coordination: WFL - gross motor Right Lower Extremity Assessment RLE ROM/Strength/Tone: WFL for tasks assessed RLE Sensation: WFL - Light Touch RLE Coordination: WFL - gross motor Left Lower Extremity Assessment LLE ROM/Strength/Tone: WFL for tasks assessed LLE Sensation: WFL - Light Touch LLE Coordination: WFL - gross motor   Balance Balance Balance Assessed: No  End of Session PT - End of Session Equipment Utilized During Treatment: Gait belt Activity Tolerance: Patient limited by fatigue;Treatment limited secondary to agitation Patient left: in bed;with call bell/phone within reach;with bed alarm set Nurse Communication: Mobility status  GP     Konrad Penta 04/18/2013, 10:49 AM

## 2013-04-18 NOTE — Care Management Note (Signed)
    Page 1 of 2   04/19/2013     2:01:09 PM   CARE MANAGEMENT NOTE 04/19/2013  Patient:  Vincent [name], Vincent [name]   Account Number:  [id number]  Date Initiated:  04/18/2013  Documentation initiated by:  Rosemary Holms  Subjective/Objective Assessment:   PTA, pt lived at home alone. PT evaluated pt who is deconditioned and they recommended SNF. Per CSW, pt declines rehab facility. CM spoke with POA Teresa at bedside. Agrees that pt will need HH services and equipment at home when DC'd.     Action/Plan:   Anticipated DC Date:  04/20/2013   Anticipated DC Plan:  HOME W HOME HEALTH SERVICES  In-house referral  Clinical Social Worker      DC Associate Professor  CM consult      PAC Choice  DURABLE MEDICAL EQUIPMENT  HOME HEALTH   Choice offered to / List presented to:     DME arranged  BEDSIDE COMMODE  WALKER - ROLLING  OXYGEN      DME agency  [facility]     [facility] arranged  HH-1 RN  HH-10 DISEASE MANAGEMENT  HH-2 PT  HH-6 SOCIAL WORKER      HH agency  [facility]   Status of service:  Completed, signed off Medicare Important Message given?  NA - LOS <3 / Initial given by admissions (If response is "NO", the following Medicare IM given date fields will be blank) Date Medicare IM given:   Date Additional Medicare IM given:    Discharge Disposition:  HOME W HOME HEALTH SERVICES  Per UR Regulation:  Reviewed for med. necessity/level of care/duration of stay  If discussed at Long Length of Stay Meetings, dates discussed:    Comments:  04/18/13 Rosemary Holms RN BSN CM Per Vira Browns, she lives 500 ' away and assists him. She is not surprised he is refusing SNF and agrees to assist him as needed. Will set up [facility] and DME. 04/19/13 Rosemary Holms RN BSN CM Pt will be DC'd on home O2 due to hypoxia. HH and DME arranged with [facility].

## 2013-04-18 NOTE — Progress Notes (Signed)
Vincent [name] ZOX:[id number] DOB: 07/06/1928 DOA: 04/17/2013 PCP: Valetta Fuller, MD   Subjective: This man apparently has been intermittently confused overnight. However, he has been hemodynamically stable and not febrile.           Physical Exam: Blood pressure 132/67, pulse 66, temperature 97.8 F (36.6 C), temperature source Axillary, resp. rate 37, height 5\' 6"  (1.676 m), weight 103.4 kg (227 lb 15.3 oz), SpO2 91.00%. He is currently somewhat drowsy but able to respond to some of my questions. Lung fields are clear. He is in atrial fibrillation and controlled ventricular rate. He does not look clinically septic. Abdomen is soft and nontender.   Investigations:  Recent Results (from the past 240 hour(s))  CULTURE, BLOOD (ROUTINE X 2)     Status: None   Collection Time    04/17/13  8:25 AM      Result Value Range Status   Specimen Description Blood   Final   Special Requests NONE   Final   Culture NO GROWTH <24 HRS   Final   Report Status PENDING   Incomplete  CULTURE, BLOOD (ROUTINE X 2)     Status: None   Collection Time    04/17/13  8:38 AM      Result Value Range Status   Specimen Description Blood   Final   Special Requests NONE   Final   Culture NO GROWTH <24 HRS   Final   Report Status PENDING   Incomplete  MRSA PCR SCREENING     Status: None   Collection Time    04/17/13  1:40 PM      Result Value Range Status   MRSA by PCR NEGATIVE  NEGATIVE Final   Comment:            The GeneXpert MRSA Assay (FDA     approved for NASAL specimens     only), is one component of a     comprehensive MRSA colonization     surveillance program. It is not     intended to diagnose MRSA     infection nor to guide or     monitor treatment for     MRSA infections.     Basic Metabolic Panel:  Recent Labs  81/19/14 0825 04/18/13 0438  NA 134* 136  K 4.1 3.6  CL 94* 99  CO2 31 29  GLUCOSE 245* 214*  BUN 11 16  CREATININE 0.94 1.04  CALCIUM 9.4 8.8   Liver  Function Tests:  Recent Labs  04/17/13 0825  AST 16  ALT 19  ALKPHOS 63  BILITOT 1.2  PROT 6.8  ALBUMIN 3.7     CBC:  Recent Labs  04/17/13 0825 04/18/13 0438  WBC 16.6* 15.2*  NEUTROABS 14.1*  --   HGB 15.0 14.3  HCT 43.2 41.8  MCV 87.8 89.3  PLT 157 140*    Dg Chest Portable 1 View  04/17/2013   *RADIOLOGY REPORT*  Clinical Data: Shortness of breath  PORTABLE CHEST - 1 VIEW  Comparison: February 01, 2010.  Findings: Stable mild cardiomegaly.  No acute pulmonary disease is noted.  No pneumothorax or pleural effusion is noted. The bony thorax is grossly intact.  IMPRESSION: No acute cardiopulmonary abnormality seen.   Original Report Authenticated By: Lupita Raider.,  M.D.      Medications: I have reviewed the patient's current medications.  Impression: 1. UTI. Doubt there is any significant bacteremia here. 2. Atrial fibrillation with controlled ventricular rate. Patient  does not wish to discuss anticoagulation. 3. Hypertension. 4. Obesity.     Plan: 1. Continue with antibiotics. 2. Reduce IV fluids and encourage oral intake. 3. Patient can move to the telemetry floor.  Consultants:  Urology consult pending.   Procedures:  None.   Antibiotics:  Rocephin intravenously started 04/17/2013.                   Code Status: Full code.  Family Communication: No family at bedside.   Disposition Plan: Home when medically stable.  Time spent:15 minutes.   LOS: 1 day   Wilson Singer Pager [phone]  04/18/2013, 7:31 AM

## 2013-04-18 NOTE — Consult Note (Signed)
[phone]

## 2013-04-19 DIAGNOSIS — B372 Candidiasis of skin and nail: Secondary | ICD-10-CM | POA: Diagnosis present

## 2013-04-19 DIAGNOSIS — I10 Essential (primary) hypertension: Secondary | ICD-10-CM

## 2013-04-19 DIAGNOSIS — R339 Retention of urine, unspecified: Secondary | ICD-10-CM

## 2013-04-19 DIAGNOSIS — R0902 Hypoxemia: Secondary | ICD-10-CM

## 2013-04-19 MED ORDER — FUROSEMIDE 40 MG PO TABS
40.0000 mg | ORAL_TABLET | Freq: Once | ORAL | Status: AC
  Administered 2013-04-19: 40 mg via ORAL
  Filled 2013-04-19: qty 1

## 2013-04-19 MED ORDER — NYSTATIN 100000 UNIT/GM EX POWD: CUTANEOUS | Status: DC

## 2013-04-19 MED ORDER — SENNOSIDES-DOCUSATE SODIUM 8.6-50 MG PO TABS: 1.0000 | ORAL_TABLET | Freq: Every evening | ORAL | Status: DC | PRN

## 2013-04-19 NOTE — Plan of Care (Signed)
Problem: Phase I Progression Outcomes Goal: Voiding-avoid urinary catheter unless indicated Outcome: Not Applicable Date Met:  04/19/13 SP catheter  Problem: Discharge Progression Outcomes Goal: Barriers To Progression Addressed/Resolved Outcome: Adequate for Discharge Home O2 Goal: Other Discharge Outcomes/Goals Outcome: Completed/Met Date Met:  04/19/13 Discharged to home with neighbor

## 2013-04-19 NOTE — Clinical Documentation Improvement (Signed)
RESPIRATORY FAILURE DOCUMENTATION CLARIFICATION QUERY   THIS DOCUMENT IS NOT A PERMANENT PART OF THE MEDICAL RECORD  TO RESPOND TO THE THIS QUERY, FOLLOW THE INSTRUCTIONS BELOW:  1. If needed, update documentation for the patient's encounter via the notes activity.  2. Access this query again and click edit on the In [employer].  3. After updating, or not, click F2 to complete all highlighted (required) fields concerning your review. Select "additional documentation in the medical record" OR "no additional documentation provided".  4. Click Sign note button.  5. The deficiency will fall out of your In Basket *Please let us know if you are not able to complete this workflow by phone or e-mail (listed below).  Please update your documentation within the medical record to reflect your response to this query.                                                                                    04/19/13  Dear Dr. Karilyn Cota Marton Redwood,  In a better effort to capture your patient's severity of illness, reflect appropriate length of stay and utilization of resources, a review of the patient medical record has revealed the following indicators.   Based on your clinical judgment, please clarify and document in a progress note and/or discharge summary the clinical condition associated with the following supporting information: In responding to this query please exercise your independent judgment.  The fact that a query is asked, does not imply that any particular answer is desired or expected.  Please clarify respiratory status. Thank you  Possible Clinical Conditions?  Acute Respiratory Failure Acute on Chronic Respiratory Failure Chronic Respiratory Failure Other Condition________________ Cannot Clinically Determine    Supporting Information:  Risk Factors: Diagnosed with Sepsis & UTI History of CHF Urinary retention  Signs&Symptoms: Hypoxia Tachypneic Shortness of breath Confusion  when waking without O2 Resting O2 sats on RA = 90%; On 2L/m via [place] = 95% Ambulating O2 sats on RA = 76%; On 2L/m via [place] =88%  Diagnostics: ABG's     PO2 = 64.7     PCO2 = 38.4     PH  = 7.461       BiCarb  27     Date of ABG's  04/17/13     O2 concentration: Room Air  Treatment: Proventil inhaler  O2 2L/m via [place], Keep sats greater than 92% Pulse Ox with Vital signs  You may use possible, probable, or suspect with inpatient documentation. possible, probable, suspected diagnoses MUST be documented at the time of discharge  Reviewed: additional documentation in the medical record  Thank You,  Debora T Williams RN, MSN Clinical Documentation Specialist: Office# [phone] [facility] Health Information Management [place]

## 2013-04-19 NOTE — Progress Notes (Signed)
Pt woke up with O2 off, oriented x1, disoriented to time, place and situation. Replaced O2, reoriented pt and helped him to get comfortable in bed again. Will continue to monitor.

## 2013-04-19 NOTE — Progress Notes (Signed)
Physical Therapy Treatment Patient Details Name: KEYONDRE HEPBURN MRN: [id number] DOB: [birth year] Today's Date: 04/19/2013 Time:  -     PT Assessment / Plan / Recommendation Comments on Treatment Session  Pt stated he was going home today, pt stated he could walk and get around with no difficulty and refused to do any therapy today.  Pt explaikned the benefits of completeing therapy to improve strength and functional mobility as well the risks of staying in bed.       Visit Information  Reason Eval/Treat Not Completed: Other (comment) (Pt refused physical therapy today)    Subjective Data  Subjective: Pt appears confused but able to state where he is at.  Pt stated he was going home today, pt stated he could walk and get around with no difficulty and refused to do any therapy today    GP     Juel Burrow 04/19/2013, 8:25 AM

## 2013-04-19 NOTE — Consult Note (Signed)
[name], [name]           ACCOUNT NO.:  [id number]  MEDICAL RECORD NO.:  [id number]  LOCATION:  A329                          FACILITY:  [facility]  PHYSICIAN:  Vincent [name], M.D.DATE OF BIRTH:  1928-02-03  DATE OF CONSULTATION: DATE OF DISCHARGE:                                CONSULTATION   HISTORY:  Vincent [name] is an 79 year old gentleman who has a history of having prostatism, has enlarged prostate.  I think he was treated with Foley catheter drainage, maybe he was not a good candidate for TURP.  Anyway he appears to be clinically stable.  Not in any congestive heart failure.  Now, he came to the emergency room because they were having problem to irrigate the suprapubic catheter.  No fever or chills. He possibly has UTI, but I can see that he is afebrile.  He was having some symptoms like loss of appetite.  Not feeling well.  White count was 16,000; today it came down to 15,000.  He has several other medical problems, which include atrial fibrillation, BPH, hypertension, history of CHF, urinary retention.  He is status post suprapubic catheter insertion about 2 years ago.  He also has a history of having TUR prostate and history of renal abscess drainage.  PHYSICAL EXAMINATION:  GENERAL:  Moderately obese male, fully conscious, alert, oriented, not in acute distress. VITAL SIGNS:  His blood pressure is 117/50.  Pulse is 66 per minute. ABDOMEN:  Obese.  Liver, spleen, kidneys not palpable.  No tenderness, and has a suprapubic catheter, which I tried to irrigate, it is completely obstructed, although it was exchanged recently on Apr 04, 2013.  The catheter appears to be otherwise fine, so I removed the catheter, then tried to irrigate under direct vision.  Then, I can see it was plugged with some calcium deposits, which came out.  Then I reinserted the same tube, irrigated the bladder, now it is draining fine.  There was no urine coming out through the tube.  I think  we will have to start him on renal side irrigation of the bladder once a day until this thing is stabilized, and he is a patient of Dr. Annabell [name].  I think he can go back and see him again, and I will leave up to him for further workup and management.  ASSESSMENT:  History of acute urinary retention, difficulty to void, plugged suprapubic catheter, atrial fibrillation, hypertension, history of congestive heart failure, history of transurethral resection of prostate and perinephric abscess drainage.  He has a suprapubic catheter draining, which was inserted about 2 years ago.     Vincent [name], M.D.     MIJ/MEDQ  D:  04/18/2013  T:  04/19/2013  Job:  [id number]

## 2013-04-19 NOTE — Progress Notes (Signed)
O2 sa at rest 90% without O2, O2 sat 76 ambulating without O2, O2 sat 88% walking with O2  Resting with O2 sat is now 95% on 2liters nasal canula

## 2013-04-19 NOTE — Discharge Summary (Signed)
Physician Discharge Summary  Vincent [name] ZOX:[id number] DOB: 11-23-1927 DOA: 04/17/2013  PCP: Vincent Fuller, MD  Admit date: 04/17/2013 Discharge date: 04/19/2013  Time spent: 40 minutes  Recommendations for Outpatient Follow-up:  1. Follow up with Dr. Annabell [name] 05/05/13. Home with [facility] RN, PT Oxygen 2. PCP 1 week for evaluation of symptoms and trending of oxygen saturation level as discharged with oxygen.    Discharge Condition: stable  Diet recommendation: heart healthy  Filed Weights   04/17/13 1340 04/18/13 0500 04/19/13 0500  Weight: 105.2 kg (231 lb 14.8 oz) 103.4 kg (227 lb 15.3 oz) 101.8 kg (224 lb 6.9 oz)    History of present illness:  Vincent [name] is a 79 y.o. male with past medical history of A. fib, prostate hypertrophy, hypertension, CHF, urinary retention status post urostomy tube approximately 2 years ago presented to the emergency department on 04/17/13 with chief complaint of weakness, shortness of breath, information obtained from the patient and a friend who is at the bedside and assists with care. Patient stated that about 2 days prior he began to feel ill in the sense that he had no appetite or energy. He denied any fever chills nausea or vomiting. He denied chest pain palpitations. He denied any abdominal pain nausea or vomiting. He stated that he had not had anything to eat or drink in the previous 2 days as he has had no appetite. He denied any diarrhea. He stated that on the morning of admission when he was getting out of bed his legs became very weak and he was unable to get out of bed. The neighbor indicated that he came to the patient's house and found the patient on the floor. Patient indicated that he slid from the bed to the floor trying to get to the bathroom. he denied any fall or loss of consciousness. The friend indicated that the patient has a urostomy tube that he has had for about a year and a half for urinary retention and that this tube gets changed  monthly at Dr. Belva [name] office. He stated that the last tube change was on May 20. He also indicated that he irrigates the tube every other day and encourages the patient to drink plenty of fluids to minimize the risk of the tube becoming clogged. He stated the day prior to presentation it was very difficult to irrigate the tube. He denied any blood tinged urine in the bag. He did state that he had noticed that the patient's groin area is moist and it appeared that the urostomy tube was leaking. Therefore they decided to come to the hospital. Workup in the emergency room yielded vital signs significant for a temperature of 100.8, respiratory rate of 32, blood pressure 107/92. Lab work significant for a white count of 16.6, lactic acid of 2.6, arterial blood gas with a pH of 7.4, PCO2 64.7, PCO2 38.4, bicarbonate 27.0. Chest x-ray with no acute cardiopulmonary abnormality. Urinalysis with positive nitrite and leukocytes as well as many bacteria WBCs and RBCs too numerous to count.   Hospital Course:  UTI (lower urinary tract infection); with early sepsis, likely related to suprapubic catheter that has not been draining properly and has been leaking from the site. admitt to step down and given IV fluids and improved quickly and transferred to medical floor 04/18/13. Blood cultures negative and urine cultures with insignificant growth. Given Rocephin for 3 doses. Dr. Jerre [name] consulted and opined tube obstructed with calcium deposits and he irrigated tube and reinserted.  Tube draining clear urine without leaking. Pt has appointment with Dr. Annabell [name] 05/05/13 Active Problems:   SIRS (systemic inflammatory response syndrome): Related to #1. Pt responded positively to IV fluids, antibiotics. Pt remained afebrile and nontoxic appearing. He remained hemodynamically stable. See #1.  Hypoxia: Related to #2 in setting of chronic afib and systolic HR.  Patient somewhat tachypneic in the emergency. Respiratory effort improved  however pt saturation level dropped to 88% with ambulation on room air. Chest xray neg on admission. Lasix held during hospitalization and this may be contributing somewhat. Also suspect pt has had slow worsening of chronic issues. Will resume lasix and discharge with home oxygen.   Urine retention: History of same in patient with PPH. He has had catheter for at least one year. This catheter gets changed monthly at the urologist office. See #1. Will request HH RN   Hematuria: Per urinalysis. Chart review indicates that P. patient has history of same. Likely related to the fact he has chronic catheter. Hemoglobin stable. No frank blood.    HYPERTENSION: Patient on Lasix at home. Held on admission blood pressure somewhat soft. Blood pressure improved with IV fluids. Lasix resumed at discharge.   ATRIAL FIBRILLATION, CHRONIC: EKG shows A. fib. Rate controlled. Home meds did not  include anticoagulant or rate control dedication. Chart review indicates pt has true allergy to coumadin. Was also on lopressor at one time but had bradycardia so this was discontinued. Was on asa at one time. Pt refused additional medications or anticoagulation med OBESITY: nutritional consult   Chronic respiratory failure secondary to COPD. Contributing to hypoxia.  Yeast dermatitis: nystatin to groin area. From obesity and leaking from suprapubic catheter.      Procedures:  Irrigation of suprapubic catheter per Dr. Paulino [name]  Consultations:  urology  Discharge Exam: Filed Vitals:   04/18/13 2343 04/19/13 0457 04/19/13 0500 04/19/13 1139  BP:  142/67    Pulse:  85    Temp:  98.7 F (37.1 C)    TempSrc:      Resp:  22    Height:      Weight:   101.8 kg (224 lb 6.9 oz)   SpO2: 93% 93%  91%    General: obese NAD Cardiovascular: irregularly irregular trace LE edema Respiratory: mild increased work of breathing with exertion. BS distant but clear. No rhonchi  Discharge Instructions      Discharge Orders    Future Orders Complete By Expires     Call MD for:  difficulty breathing, headache or visual disturbances  As directed     Call MD for:  temperature >100.4  As directed     Diet - low sodium heart healthy  As directed     Discharge instructions  As directed     Comments:      Take medications as prescribed    Increase activity slowly  As directed         Medication List    TAKE these medications       albuterol 108 (90 BASE) MCG/ACT inhaler  Commonly known as:  PROVENTIL HFA;VENTOLIN HFA  Inhale 2 puffs into the lungs every 6 (six) hours as needed. For asthma     amLODipine 10 MG tablet  Commonly known as:  NORVASC  Take 10 mg by mouth daily.     budesonide-formoterol 80-4.5 MCG/ACT inhaler  Commonly known as:  SYMBICORT  Inhale 2 puffs into the lungs 2 (two) times daily as needed. Shortness of Breath  digoxin 0.25 MG tablet  Commonly known as:  LANOXIN  Take 0.25 mg by mouth daily.     furosemide 20 MG tablet  Commonly known as:  LASIX  Take 20 mg by mouth daily.     nystatin 100000 UNIT/GM Powd  Apply to groin three times daily    Dispense 1 container     oxyCODONE-acetaminophen 5-325 MG per tablet  Commonly known as:  PERCOCET/ROXICET  Take 1 tablet by mouth every 4 (four) hours as needed. Pain     senna-docusate 8.6-50 MG per tablet  Commonly known as:  Senokot-S  Take 1 tablet by mouth at bedtime as needed.     tamsulosin 0.4 MG Caps  Commonly known as:  FLOMAX  Take 0.4 mg by mouth daily.       No Known Allergies Follow-up Information   Follow up with Anner Crete, MD On 05/05/2013. (at 8:30am in [place] office. Call [place] if need to be seen sooner. )    Contact information:   [address] [place] [address] [phone]       Follow up with [facility], MD. Schedule an appointment as soon as possible for a visit in 1 week. (evaluate symptoms. recommend trending oxygen saturation level as discharged with oxygen)     Contact information:   [address]                         [place] [address] [phone]        The results of significant diagnostics from this hospitalization (including imaging, microbiology, ancillary and laboratory) are listed below for reference.    Significant Diagnostic Studies: Dg Chest Portable 1 View  04/17/2013   *RADIOLOGY REPORT*  Clinical Data: Shortness of breath  PORTABLE CHEST - 1 VIEW  Comparison: February 01, 2010.  Findings: Stable mild cardiomegaly.  No acute pulmonary disease is noted.  No pneumothorax or pleural effusion is noted. The bony thorax is grossly intact.  IMPRESSION: No acute cardiopulmonary abnormality seen.   Original Report Authenticated By: Lupita Raider.,  M.D.    Microbiology: Recent Results (from the past 240 hour(s))  CULTURE, BLOOD (ROUTINE X 2)     Status: None   Collection Time    04/17/13  8:25 AM      Result Value Range Status   Specimen Description BLOOD LEFT ANTECUBITAL   Final   Special Requests BOTTLES DRAWN AEROBIC ONLY 8CC   Final   Culture NO GROWTH 2 DAYS   Final   Report Status PENDING   Incomplete  CULTURE, BLOOD (ROUTINE X 2)     Status: None   Collection Time    04/17/13  8:38 AM      Result Value Range Status   Specimen Description BLOOD RIGHT ANTECUBITAL   Final   Special Requests BOTTLES DRAWN AEROBIC AND ANAEROBIC 10CC   Final   Culture NO GROWTH 2 DAYS   Final   Report Status PENDING   Incomplete  URINE CULTURE     Status: None   Collection Time    04/17/13  9:50 AM      Result Value Range Status   Specimen Description URINE, CLEAN CATCH   Final   Special Requests NONE   Final   Culture  Setup Time 04/17/2013 16:00   Final   Colony Count 2,000 COLONIES/ML   Final   Culture INSIGNIFICANT GROWTH   Final  Report Status 04/18/2013 FINAL   Final  MRSA PCR SCREENING     Status: None   Collection Time    04/17/13  1:40 PM      Result Value Range Status   MRSA by PCR NEGATIVE   NEGATIVE Final   Comment:            The GeneXpert MRSA Assay (FDA     approved for NASAL specimens     only), is one component of a     comprehensive MRSA colonization     surveillance program. It is not     intended to diagnose MRSA     infection nor to guide or     monitor treatment for     MRSA infections.     Labs: Basic Metabolic Panel:  Recent Labs Lab 04/17/13 0825 04/18/13 0438  NA 134* 136  K 4.1 3.6  CL 94* 99  CO2 31 29  GLUCOSE 245* 214*  BUN 11 16  CREATININE 0.94 1.04  CALCIUM 9.4 8.8   Liver Function Tests:  Recent Labs Lab 04/17/13 0825  AST 16  ALT 19  ALKPHOS 63  BILITOT 1.2  PROT 6.8  ALBUMIN 3.7     CBC:  Recent Labs Lab 04/17/13 0825 04/18/13 0438  WBC 16.6* 15.2*  NEUTROABS 14.1*  --   HGB 15.0 14.3  HCT 43.2 41.8  MCV 87.8 89.3  PLT 157 140*   Cardiac Enzymes:  Recent Labs Lab 04/17/13 0825  TROPONINI <0.30   BNP: BNP (last 3 results)  Recent Labs  04/17/13 2349  PROBNP 3622.0*   CBG: No results found for this basename: GLUCAP,  in the last 168 hours     Signed:  Gwenyth Bender  [facility] 04/19/2013, 12:50 PM Attending: Patient seen and examined. Patient is medically stable for discharge.

## 2013-04-24 LAB — CULTURE, BLOOD (ROUTINE X 2)

## 2013-05-05 ENCOUNTER — Ambulatory Visit (INDEPENDENT_AMBULATORY_CARE_PROVIDER_SITE_OTHER): Payer: Medicare Other | Admitting: Urology

## 2013-05-05 DIAGNOSIS — R339 Retention of urine, unspecified: Secondary | ICD-10-CM

## 2013-07-07 ENCOUNTER — Ambulatory Visit (INDEPENDENT_AMBULATORY_CARE_PROVIDER_SITE_OTHER): Payer: Medicare Other | Admitting: Urology

## 2013-07-07 DIAGNOSIS — R339 Retention of urine, unspecified: Secondary | ICD-10-CM

## 2013-07-28 ENCOUNTER — Emergency Department (HOSPITAL_COMMUNITY): Payer: Medicare Other

## 2013-07-28 ENCOUNTER — Encounter (HOSPITAL_COMMUNITY): Payer: Self-pay

## 2013-07-28 ENCOUNTER — Inpatient Hospital Stay (HOSPITAL_COMMUNITY): Payer: Medicare Other

## 2013-07-28 ENCOUNTER — Inpatient Hospital Stay (HOSPITAL_COMMUNITY)
Admission: EM | Admit: 2013-07-28 | Discharge: 2013-07-29 | DRG: 065 | Disposition: A | Discharge status: Home / Self Care | Payer: Medicare Other | Attending: Internal Medicine | Admitting: Internal Medicine

## 2013-07-28 ENCOUNTER — Observation Stay (HOSPITAL_COMMUNITY): Payer: Medicare Other

## 2013-07-28 DIAGNOSIS — Z888 Allergy status to other drugs, medicaments and biological substances status: Secondary | ICD-10-CM

## 2013-07-28 DIAGNOSIS — E669 Obesity, unspecified: Secondary | ICD-10-CM | POA: Diagnosis present

## 2013-07-28 DIAGNOSIS — M6281 Muscle weakness (generalized): Secondary | ICD-10-CM

## 2013-07-28 DIAGNOSIS — I509 Heart failure, unspecified: Secondary | ICD-10-CM | POA: Diagnosis present

## 2013-07-28 DIAGNOSIS — Z66 Do not resuscitate: Secondary | ICD-10-CM | POA: Diagnosis present

## 2013-07-28 DIAGNOSIS — I5032 Chronic diastolic (congestive) heart failure: Secondary | ICD-10-CM | POA: Diagnosis present

## 2013-07-28 DIAGNOSIS — I6350 Cerebral infarction due to unspecified occlusion or stenosis of unspecified cerebral artery: Principal | ICD-10-CM | POA: Diagnosis present

## 2013-07-28 DIAGNOSIS — B372 Candidiasis of skin and nail: Secondary | ICD-10-CM | POA: Diagnosis present

## 2013-07-28 DIAGNOSIS — I4891 Unspecified atrial fibrillation: Secondary | ICD-10-CM | POA: Diagnosis present

## 2013-07-28 DIAGNOSIS — I10 Essential (primary) hypertension: Secondary | ICD-10-CM | POA: Diagnosis present

## 2013-07-28 DIAGNOSIS — R531 Weakness: Secondary | ICD-10-CM | POA: Diagnosis present

## 2013-07-28 DIAGNOSIS — I359 Nonrheumatic aortic valve disorder, unspecified: Secondary | ICD-10-CM

## 2013-07-28 DIAGNOSIS — N400 Enlarged prostate without lower urinary tract symptoms: Secondary | ICD-10-CM | POA: Diagnosis present

## 2013-07-28 DIAGNOSIS — R339 Retention of urine, unspecified: Secondary | ICD-10-CM | POA: Diagnosis present

## 2013-07-28 DIAGNOSIS — Z936 Other artificial openings of urinary tract status: Secondary | ICD-10-CM

## 2013-07-28 DIAGNOSIS — G8190 Hemiplegia, unspecified affecting unspecified side: Secondary | ICD-10-CM | POA: Diagnosis present

## 2013-07-28 DIAGNOSIS — M47817 Spondylosis without myelopathy or radiculopathy, lumbosacral region: Secondary | ICD-10-CM | POA: Diagnosis present

## 2013-07-28 DIAGNOSIS — I639 Cerebral infarction, unspecified: Secondary | ICD-10-CM

## 2013-07-28 DIAGNOSIS — Z87891 Personal history of nicotine dependence: Secondary | ICD-10-CM

## 2013-07-28 LAB — CBC WITH DIFFERENTIAL/PLATELET
Basophils Absolute: 0 10*3/uL (ref 0.0–0.1)
Basophils Relative: 0 % (ref 0–1)
Eosinophils Absolute: 0.3 10*3/uL (ref 0.0–0.7)
Lymphs Abs: 1.3 10*3/uL (ref 0.7–4.0)
MCH: 30.6 pg (ref 26.0–34.0)
Neutrophils Relative %: 71 % (ref 43–77)
Platelets: 163 10*3/uL (ref 150–400)
RBC: 5.58 MIL/uL (ref 4.22–5.81)

## 2013-07-28 LAB — LIPID PANEL
Cholesterol: 184 mg/dL (ref 0–200)
HDL: 38 mg/dL — ABNORMAL LOW (ref >39)
Total CHOL/HDL Ratio: 4.8 RATIO
Triglycerides: 119 mg/dL (ref <150)
VLDL: 24 mg/dL (ref 0–40)

## 2013-07-28 LAB — COMPREHENSIVE METABOLIC PANEL
ALT: 18 U/L (ref 0–53)
AST: 20 U/L (ref 0–37)
Albumin: 4.3 g/dL (ref 3.5–5.2)
Alkaline Phosphatase: 56 U/L (ref 39–117)
Potassium: 4 mEq/L (ref 3.5–5.1)
Sodium: 138 mEq/L (ref 135–145)
Total Protein: 8 g/dL (ref 6.0–8.3)

## 2013-07-28 LAB — URINE MICROSCOPIC-ADD ON

## 2013-07-28 LAB — URINALYSIS, ROUTINE W REFLEX MICROSCOPIC
Bilirubin Urine: NEGATIVE
Specific Gravity, Urine: 1.015 (ref 1.005–1.030)
pH: 5.5 (ref 5.0–8.0)

## 2013-07-28 LAB — HEMOGLOBIN A1C: Mean Plasma Glucose: 192 mg/dL — ABNORMAL HIGH (ref <117)

## 2013-07-28 MED ORDER — NYSTATIN 100000 UNIT/GM EX POWD
Freq: Three times a day (TID) | CUTANEOUS | Status: DC
  Filled 2013-07-28: qty 15

## 2013-07-28 MED ORDER — SENNOSIDES-DOCUSATE SODIUM 8.6-50 MG PO TABS: 1.0000 | ORAL_TABLET | Freq: Every evening | ORAL | Status: DC | PRN

## 2013-07-28 MED ORDER — ASPIRIN 325 MG PO TABS
325.0000 mg | ORAL_TABLET | Freq: Every day | ORAL | Status: DC
  Administered 2013-07-28 – 2013-07-29 (×2): 325 mg via ORAL
  Filled 2013-07-28 (×2): qty 1

## 2013-07-28 MED ORDER — ATORVASTATIN CALCIUM 10 MG PO TABS
10.0000 mg | ORAL_TABLET | Freq: Every day | ORAL | Status: DC
  Administered 2013-07-28: 10 mg via ORAL
  Filled 2013-07-28: qty 1

## 2013-07-28 MED ORDER — ENOXAPARIN SODIUM 40 MG/0.4ML ~~LOC~~ SOLN
40.0000 mg | SUBCUTANEOUS | Status: DC
  Filled 2013-07-28: qty 0.4

## 2013-07-28 MED ORDER — ASPIRIN 300 MG RE SUPP
300.0000 mg | Freq: Every day | RECTAL | Status: DC
  Filled 2013-07-28 (×4): qty 1

## 2013-07-28 MED ORDER — BUDESONIDE-FORMOTEROL FUMARATE 80-4.5 MCG/ACT IN AERO
2.0000 | INHALATION_SPRAY | Freq: Two times a day (BID) | RESPIRATORY_TRACT | Status: DC | PRN
  Filled 2013-07-28: qty 6.9

## 2013-07-28 MED ORDER — TAMSULOSIN HCL 0.4 MG PO CAPS
0.4000 mg | ORAL_CAPSULE | Freq: Every day | ORAL | Status: DC
  Administered 2013-07-28 – 2013-07-29 (×2): 0.4 mg via ORAL
  Filled 2013-07-28 (×2): qty 1

## 2013-07-28 MED ORDER — DIGOXIN 125 MCG PO TABS
0.2500 mg | ORAL_TABLET | Freq: Every day | ORAL | Status: DC
  Administered 2013-07-28 – 2013-07-29 (×2): 0.25 mg via ORAL
  Filled 2013-07-28: qty 2
  Filled 2013-07-28 (×2): qty 1

## 2013-07-28 MED ORDER — ACETAMINOPHEN 650 MG RE SUPP: 650.0000 mg | RECTAL | Status: DC | PRN

## 2013-07-28 MED ORDER — OXYCODONE-ACETAMINOPHEN 5-325 MG PO TABS: 1.0000 | ORAL_TABLET | ORAL | Status: DC | PRN

## 2013-07-28 MED ORDER — ALBUTEROL SULFATE HFA 108 (90 BASE) MCG/ACT IN AERS: 2.0000 | INHALATION_SPRAY | Freq: Four times a day (QID) | RESPIRATORY_TRACT | Status: DC | PRN

## 2013-07-28 MED ORDER — ACETAMINOPHEN 325 MG PO TABS: 650.0000 mg | ORAL_TABLET | ORAL | Status: DC | PRN

## 2013-07-28 NOTE — Progress Notes (Signed)
07/28/13 1737 Notified Dr. Kerry Hough of speech therapy evaluation to be completed this evening if possible. Stated okay for speech therapy order to be discontinued since patient passed bedside swallow study and tolerated diet with no complaints. Earnstine Regal, RN

## 2013-07-28 NOTE — ED Notes (Signed)
Pt's POA at bedside to attempt to convince the pt to come into the hospital per EDP request

## 2013-07-28 NOTE — ED Notes (Signed)
Pt's POA reports pt went to urologist Tuesday because his urostomy tube had stopped up.  Reports urologist injected some extra air in the tube to keep tube in place.  POA reports when pt got home he c/o feeling dizzy, had weakness in r arm and r leg.  Reports could not hold anything with his r hand.  Reports pt called his neighbor this am because his speech was slurred.

## 2013-07-28 NOTE — Progress Notes (Signed)
*  PRELIMINARY RESULTS* Echocardiogram 2D Echocardiogram has been performed.  Vincent [name] 07/28/2013, 5:46 PM

## 2013-07-28 NOTE — Progress Notes (Signed)
Nutrition Brief Note  Patient identified on the Malnutrition Screening Tool (MST) Report.  Wt Readings from Last 15 Encounters:  07/28/13 200 lb (90.719 kg)  04/19/13 224 lb 6.9 oz (101.8 kg)  09/22/12 260 lb (117.935 kg)  07/12/12 260 lb (117.935 kg)  04/25/12 260 lb (117.935 kg)  04/07/12 260 lb (117.935 kg)  04/05/12 260 lb (117.935 kg)  11/15/11 260 lb (117.935 kg)  11/13/11 260 lb (117.935 kg)  10/13/11 260 lb (117.935 kg)  10/12/11 260 lb (117.935 kg)  08/14/11 260 lb (117.935 kg)  07/13/11 260 lb (117.935 kg)  07/02/11 260 lb (117.935 kg)  06/23/11 260 lb (117.935 kg)   Chart reviewed. Pt admitted with stroke. Wt hx reveals UBW: 260# up until approximately 1 year ago. Pt has lost 24# (10.7%) x 3 months, which is clinically significant.   Pt not available for interview at time of visit. No family or caregivers present. Called POA at 1328, however, POA unavailable at time of call.  Nutrition screen report reveals unintentional weight loss, but denies eating poorly. PO intake unavailable at this time. Pt passed swallow screen. Noted pt with hx of CHF and on Lasix. Wt loss likely due to fluid loss.   Body mass index is 32.3 kg/(m^2). Patient meets criteria for obesity, class I based on current BMI.   Current diet order is Heart Healthy, patient is consuming approximately n/a% of meals at this time. Labs and medications reviewed.   No nutrition interventions warranted at this time. If nutrition issues arise, please consult RD.   Danayah Smyre A. Mayford Knife, RD, LDN Pager: [phone]

## 2013-07-28 NOTE — Progress Notes (Signed)
Utilization Review Complete  

## 2013-07-28 NOTE — ED Notes (Signed)
Pt's rt grip slightly weaker than lt

## 2013-07-28 NOTE — Progress Notes (Signed)
07/28/13 1835 Discussed rationale for lovenox and SCDs to reduce risk of blood clots with patient this evening. Patient stated "what you going to do with that shot, I don't want that". Patient requested IV site be removed, stated "if you don't take it out, it's coming out". Discussed importance of IV access in case needed. Notified Dr. Kerry Hough patient refuses lovenox and SCDs. MD attempted to speak with patient over phone, pt requested MD speak with his caregiver, Rosey Bath. Caregiver at bedside and attempting to calm patient.  IV site to remain in place per MD. Pt up in chair with chair alarm on for safety. Pt aware of fall prevention/safety plan, instructed to call for assist and not attempt getting up on his own. Earnstine Regal, RN

## 2013-07-28 NOTE — Progress Notes (Signed)
07/28/13 1927 Patient went down to CT for CT angio of neck as ordered per Dr. Kerry Hough, agreeable to having test done while on floor. Received call from CT this evening, pt refusing to have test completed. Spoke with patient regarding reason for CT angio of neck ordered.  Patient stated "nobody is putting anything else in my body, I don't want anything else". Patient back to room.  Text-paged Dr. Rito Ehrlich to notify patient did not want to complete CT angio of neck. Notified night shift RN as well. Earnstine Regal, RN

## 2013-07-28 NOTE — ED Provider Notes (Signed)
CSN: [id number]     Arrival date & time 07/28/13  7829 History  This chart was scribed for Vincent Lennert, MD by Vincent [name], ED scribe.  This patient was seen in room APA14/APA14 and the patient's care was started at 9:41 AM.   Chief Complaint  Patient presents with  . Weakness  . Dizziness    Patient is a 79 y.o. male presenting with weakness. History provided by: a neighbor (pt's POA) No language interpreter was used.  Weakness This is a new problem. The current episode started more than 2 days ago. The problem occurs constantly. Pertinent negatives include no chest pain, no abdominal pain, no headaches and no shortness of breath. Associated symptoms comments: Slurred speech, dizziness. Nothing aggravates the symptoms. Nothing relieves the symptoms.    HPI Comments: Vincent [name] is a 79 y.o. male with h/o HTN, CHF, atrial fibrillation, and urinary retention status post urostomy tube placement who presents to the Emergency Department complaining of right-sided hemiparesis, slurred speech, and dizziness that began over the past several days.  Pt's neighbor is his POA and provides his history.  Pt went to his urologist 3 days ago because his urostomy tube had stopped up, and had his tube replaced in the office.  When he arrived home he began to feel dizzy and had to lie down.  He was was given one aspirin at that time.  The following day he developed weakness in his right arm and right leg.  POA reports that he has been unable lift a flashlight with his right arm and has had difficulty walking due to his leg weakness.  Normally he is able to ambulate with or without a cane.  Pt also complained of tingling from his right shoulder down his arm.  Today he called his neighbor because his speech was slurred.   PCP is Dr. Charm [name] in [place]  Past Medical History  Diagnosis Date  . Urine retention   . Atrial fibrillation   . Lumbar spondylosis   . Prostate hypertrophy   . Arachnoid  cyst   . Hypertension   . CHF (congestive heart failure)   . UTI (lower urinary tract infection)   . Sepsis     Past Surgical History  Procedure Laterality Date  . Suprapubic catheter placement    . Transurethral resection of prostate    . Renal abscess drainage      No family history on file.   History  Substance Use Topics  . Smoking status: Former Games developer  . Smokeless tobacco: Not on file  . Alcohol Use: No     Review of Systems  Constitutional: Negative for appetite change and fatigue.  HENT: Negative for congestion, sinus pressure and ear discharge.   Eyes: Negative for discharge.  Respiratory: Negative for cough and shortness of breath.   Cardiovascular: Negative for chest pain.  Gastrointestinal: Negative for abdominal pain and diarrhea.  Genitourinary: Negative for frequency and hematuria.  Musculoskeletal: Negative for back pain.  Skin: Negative for rash.  Neurological: Positive for dizziness, speech difficulty and weakness. Negative for seizures and headaches.  Psychiatric/Behavioral: Negative for hallucinations.     Allergies  Review of patient's allergies indicates no known allergies.  Home Medications   Current Outpatient Rx  Name  Route  Sig  Dispense  Refill  . albuterol (PROVENTIL HFA;VENTOLIN HFA) 108 (90 BASE) MCG/ACT inhaler   Inhalation   Inhale 2 puffs into the lungs every 6 (six) hours as needed. For asthma         .  amLODipine (NORVASC) 10 MG tablet   Oral   Take 10 mg by mouth daily.         . budesonide-formoterol (SYMBICORT) 80-4.5 MCG/ACT inhaler   Inhalation   Inhale 2 puffs into the lungs 2 (two) times daily as needed. Shortness of Breath          . digoxin (LANOXIN) 0.25 MG tablet   Oral   Take 0.25 mg by mouth daily.         . furosemide (LASIX) 20 MG tablet   Oral   Take 20 mg by mouth daily.         Marland Kitchen nystatin (MYCOSTATIN/NYSTOP) 100000 UNIT/GM POWD      Apply to groin three times daily  Dispense 1  container   1 Bottle   0   . oxyCODONE-acetaminophen (PERCOCET/ROXICET) 5-325 MG per tablet   Oral   Take 1 tablet by mouth every 4 (four) hours as needed. Pain         . senna-docusate (SENOKOT-S) 8.6-50 MG per tablet   Oral   Take 1 tablet by mouth at bedtime as needed.         . tamsulosin (FLOMAX) 0.4 MG CAPS   Oral   Take 0.4 mg by mouth daily.          BP 154/85  Pulse 58  Temp(Src) 97.9 F (36.6 C) (Oral)  Resp 21  Ht 5\' 9"  (1.753 m)  Wt 200 lb (90.719 kg)  BMI 29.52 kg/m2  SpO2 93%  Physical Exam  Nursing note and vitals reviewed. Constitutional: He is oriented to person, place, and time. He appears well-developed.  HENT:  Head: Normocephalic.  Eyes: Conjunctivae and EOM are normal. No scleral icterus.  Neck: Neck supple. No thyromegaly present.  Cardiovascular: Normal rate and regular rhythm.  Exam reveals no gallop and no friction rub.   No murmur heard. Pulmonary/Chest: No stridor. He has no wheezes. He has no rales. He exhibits no tenderness.  Abdominal: He exhibits no distension. There is no tenderness. There is no rebound.  Suprapubic catheter  Musculoskeletal: He exhibits no edema.  Lymphadenopathy:    He has no cervical adenopathy.  Neurological: He is oriented to person, place, and time.  Mild weakness in right hand and right leg  Skin: No rash noted. No erythema.  Psychiatric: He has a normal mood and affect. His behavior is normal.    ED Course  Procedures (including critical care time)  DIAGNOSTIC STUDIES: Oxygen Saturation is 93% on room air, adequate by my interpretation.    COORDINATION OF CARE: 9:49 AM: Discussed treatment plan which includes EKG, IV fluids, heat CT and labs.  Pt expressed understanding and agreed to plan.  11:25 AM: Informed pt that imaging indicates symptoms are likely due to stroke and advised admission for further evaluation and treatment.     Labs Review Labs Reviewed  CBC WITH DIFFERENTIAL - Abnormal;  Notable for the following:    Hemoglobin 17.1 (*)    All other components within normal limits  COMPREHENSIVE METABOLIC PANEL - Abnormal; Notable for the following:    Glucose, Bld 174 (*)    GFR calc non Af Amer 62 (*)    GFR calc Af Amer 72 (*)    All other components within normal limits  URINALYSIS, ROUTINE W REFLEX MICROSCOPIC - Abnormal; Notable for the following:    Hgb urine dipstick TRACE (*)    Leukocytes, UA SMALL (*)    All other components within  normal limits  DIGOXIN LEVEL  URINE MICROSCOPIC-ADD ON    Imaging Review Ct Head Wo Contrast  07/28/2013   *RADIOLOGY REPORT*  Clinical Data: Dizziness and weakness  CT HEAD WITHOUT CONTRAST  Technique:  Contiguous axial images were obtained from the base of the skull through the vertex without contrast.  Comparison: CT head 02/01/2010  Findings: Moderate atrophy.  Left middle cranial fossa arachnoid cyst measures 28 x 41 mm and is unchanged.  Chronic microvascular ischemic change in the white matter. Hypodensity left lateral thalamus consistent with infarct of indeterminate age.  Negative for hemorrhage.  Atherosclerotic disease in the carotid and vertebral arteries which are calcified.  IMPRESSION: Atrophy and chronic microvascular ischemia.  Hypodensity left thalamus consistent with infarct of indeterminate age.  This was not present previously.  Arachnoid cyst left middle cranial fossa unchanged.   Original Report Authenticated By: Janeece Riggers, M.D.    Date: 07/28/2013  Rate:62  Rhythm: atrial fibrillation  QRS Axis: normal  Intervals: normal  ST/T Wave abnormalities: nonspecific ST changes  Conduction Disutrbances:none  Narrative Interpretation:   Old EKG Reviewed: none available   MDM  No diagnosis found.     The chart was scribed for me under my direct supervision.  I personally performed the history, physical, and medical decision making and all procedures in the evaluation of this patient.Vincent Lennert,  MD 07/28/13 1228

## 2013-07-28 NOTE — H&P (Signed)
[facility] History and Physical  [name]:[id number] DOB: Feb 28, 1928 DOA: 07/28/2013  Referring physician:  PCP: Valetta Fuller, MD  Specialists:   Chief Complaint:   HPI: Vincent [name] is a 79 y.o. male with a past medical history that includes A. fib not on any anticoagulation, BPH, hypertension, CHF, urinary retention status post urostomy tube approximately 2 years ago presents to the emergency department with the chief complaint of right-sided weakness. Information is obtained from the patient and his healthcare power of attorney Vincent [name] via telephone. He reports that about 3 days ago patient began to feel a little dizzy after a visit to his urologist for a urostomy tube change. He indicated that he went to lay down for a while. He took one aspirin. The following day he developed weakness in his right arm and leg. Ms. Darlys Gales reports that he was unable to grip a flashlight with his right hand and she noted some worsening ambulation due to weakness in the right leg. She also reports some mild slurred speech. His baseline is ambulating with a cane intermittently. Patient also indicates some bilateral tingling of his fingers and hands. He denies any chest pain palpitation headache visual disturbances. He denies any difficulty swallowing. He denies any shortness of breath cough. He denies any fever chills abdominal pain nausea vomiting constipation diarrhea melena. Initial evaluation in the emergency room is unremarkable in terms of his lab work with the exception of alkaline phosphate at 174. CT of the head yields a hypodensity left thalamus consistent with infarct of indeterminate age. This was not present previously. Patient's last hospitalization was June of this year for urinary tract infection. He indicates that he developed right-sided weakness since that time but he did not go to the doctor as he "knew i had a stroke". Symptoms came on gradually have persisted and  worsened characterized as mild/moderate. [facility] are asked to admit    Review of Systems: In point review of systems completed all systems are negative except as indicated in the history of present illness  Past Medical History  Diagnosis Date  . Urine retention   . Atrial fibrillation   . Lumbar spondylosis   . Prostate hypertrophy   . Arachnoid cyst   . Hypertension   . CHF (congestive heart failure)   . UTI (lower urinary tract infection)   . Sepsis    Past Surgical History  Procedure Laterality Date  . Suprapubic catheter placement    . Transurethral resection of prostate    . Renal abscess drainage     Social History:  reports that he has quit smoking. He does not have any smokeless tobacco history on file. He reports that he does not drink alcohol or use illicit drugs. Patient lives alone with help from neighbors. He is independent with ADLs. He typically ambulates with a cane.  No Known Allergies  No family history on file. no pertinent family history. Mother and father both deceased. Patient unable to recall past medical history of either his mother or his father.  Prior to Admission medications   Medication Sig Start Date End Date Taking? Authorizing Provider  albuterol (PROVENTIL HFA;VENTOLIN HFA) 108 (90 BASE) MCG/ACT inhaler Inhale 2 puffs into the lungs every 6 (six) hours as needed. For asthma    Historical Provider, MD  amLODipine (NORVASC) 10 MG tablet Take 10 mg by mouth daily.    Historical Provider, MD  budesonide-formoterol (SYMBICORT) 80-4.5 MCG/ACT inhaler Inhale 2 puffs into the lungs  2 (two) times daily as needed. Shortness of Breath     Historical Provider, MD  digoxin (LANOXIN) 0.25 MG tablet Take 0.25 mg by mouth daily.    Historical Provider, MD  furosemide (LASIX) 20 MG tablet Take 20 mg by mouth daily.    Historical Provider, MD  nystatin (MYCOSTATIN/NYSTOP) 100000 UNIT/GM POWD Apply to groin three times daily  Dispense 1 container  04/19/13   Gwenyth Bender, NP  oxyCODONE-acetaminophen (PERCOCET/ROXICET) 5-325 MG per tablet Take 1 tablet by mouth every 4 (four) hours as needed. Pain    Historical Provider, MD  senna-docusate (SENOKOT-S) 8.6-50 MG per tablet Take 1 tablet by mouth at bedtime as needed. 04/19/13   Gwenyth Bender, NP  tamsulosin (FLOMAX) 0.4 MG CAPS Take 0.4 mg by mouth daily.    Historical Provider, MD   Physical Exam: Filed Vitals:   07/28/13 1158  BP:   Pulse:   Temp: 98.2 F (36.8 C)  Resp:      General:  Obese alert no acute distress  Eyes: PE RRL, EOMI, no scleral icterus  ENT: Ears clear nose without drainage oropharynx without erythema or exudate. Mucous membranes of his mouth are moist and pink  Neck: Supple no JVD full range of motion no lymphadenopathy  Cardiovascular: Irregular without murmur gallop or rub. Trace lower extremity edema.  Respiratory: Normal effort. Breath sounds clear bilaterally without wheeze or rhonchi.  Abdomen: Obese soft positive bowel sounds nontender to palpation ostomy tube intact draining moderate amount of clear urine.  Skin: Warm and dry no rash no lesions  Musculoskeletal: Moves all extremities no clubbing no cyanosis  Psychiatric: Calm resistant to care but generally cooperative  Neurologic: Speech clear facial symmetry cranial nerves II through XII grossly intact fair muscle tone upper extremity strength 4/5 on right 5 over 5 on left lower extremity strength bilateral 5 over 5  Labs on Admission:  Basic Metabolic Panel:  Recent Labs Lab 07/28/13 0954  NA 138  K 4.0  CL 97  CO2 31  GLUCOSE 174*  BUN 21  CREATININE 1.07  CALCIUM 10.5   Liver Function Tests:  Recent Labs Lab 07/28/13 0954  AST 20  ALT 18  ALKPHOS 56  BILITOT 0.7  PROT 8.0  ALBUMIN 4.3   No results found for this basename: LIPASE, AMYLASE,  in the last 168 hours No results found for this basename: AMMONIA,  in the last 168 hours CBC:  Recent Labs Lab  07/28/13 0954  WBC 8.2  NEUTROABS 5.8  HGB 17.1*  HCT 50.5  MCV 90.5  PLT 163   Cardiac Enzymes: No results found for this basename: CKTOTAL, CKMB, CKMBINDEX, TROPONINI,  in the last 168 hours  BNP (last 3 results)  Recent Labs  04/17/13 2349  PROBNP 3622.0*   CBG: No results found for this basename: GLUCAP,  in the last 168 hours  Radiological Exams on Admission: Ct Head Wo Contrast  07/28/2013   *RADIOLOGY REPORT*  Clinical Data: Dizziness and weakness  CT HEAD WITHOUT CONTRAST  Technique:  Contiguous axial images were obtained from the base of the skull through the vertex without contrast.  Comparison: CT head 02/01/2010  Findings: Moderate atrophy.  Left middle cranial fossa arachnoid cyst measures 28 x 41 mm and is unchanged.  Chronic microvascular ischemic change in the white matter. Hypodensity left lateral thalamus consistent with infarct of indeterminate age.  Negative for hemorrhage.  Atherosclerotic disease in the carotid and vertebral arteries which are calcified.  IMPRESSION:  Atrophy and chronic microvascular ischemia.  Hypodensity left thalamus consistent with infarct of indeterminate age.  This was not present previously.  Arachnoid cyst left middle cranial fossa unchanged.   Original Report Authenticated By: Janeece Riggers, M.D.    EKG: Independently reviewed. A. fib with occasional PVC  Assessment/Plan Principal Problem:   Right sided weakness: CT concerning for acute or subacute stroke. Will admit to telemetry. Will get an MRI/MRA as well as carotid Dopplers and 2-D echo. We will check his hemoglobin A1c, TSH, lipid panel. We will conduct a bedside swallow eval and once he passes that we'll start a heart healthy diet. Will provide aspirin and statin. Will hold his Norvasc and Lasix for now to allow for permissive hypertension. Will request PT and OT consult.  Active Problems: Hypertension: Currently systolic blood pressure range 161 05/16/1951 with a diastolic range  of 60-85. Patient is on Norvasc and Lasix at home. Will hold these agents for now. Will monitor closely.  Atrial fibrillation. Currently rate controlled. Patient is on dig at home. Will continue this for now he is not on a beta blocker nor is he on any anti-coagulation. He has a 20 allergy to Coumadin and he refuses anti-coagulation.  History of CHF likely diastolic. Last echo 2011 yields an ejection fraction of 65-70%. Will repeat echo due to #1. Will hold place for now. Will obtain daily weights and monitor strict intake and output. Early patient does not appear to be volume overloaded  Urinary retention. Status post urostomy tube 2 years ago. Recently had tube changed do to blockage. Currently draining moderate amount of straw-colored urine. Will continue Flomax    OBESITY: PMI 32.3       BENIGN PROSTATIC HYPERTROPHY, HX OF: Stable at baseline.      Yeast dermatitis: Stable at baseline. Will continue his home regimen   Code Status: DNR Family Communication: No family patient has very close neighbors who are active in his care. To restart Darlys Gales is his healthcare power of attorney and I updated her on the telephone via the number noted in the chart Disposition Plan: Home when ready hopefully tomorrow  Time spent: 60 minutes  Gwenyth Bender [facility] Pager [phone]  If 7PM-7AM, please contact night-coverage [email or web address] Password [email or web address] 07/28/2013, 1:32 PM

## 2013-07-28 NOTE — Progress Notes (Signed)
07/28/13 1539 Received call from speech therapy regarding speech therapy evaluation. Aware of orders and speech therapy see patient this evening if possible. Notified patient tolerating heart healthy diet well this afternoon per MD order. Earnstine Regal, RN

## 2013-07-28 NOTE — ED Notes (Signed)
Pt's POA is his neighbor Vicente Masson, cell [phone], home [phone].

## 2013-07-28 NOTE — H&P (Signed)
Patient seen and examined. Agree with note as per Toya Smothers, NP.  Patient has been admitted with acute right-sided weakness and found to have an acute infarction within the left pons. His caregiver reports that he is not consistently been taking aspirin at home. His LDL is elevated. 2-D echocardiogram has been done with report pending. Carotid Dopplers indicate a less than 50% ICA stenosis bilaterally, but recommendations were for CT angiography negative for better visualization. The patient does have a history of atrial for ablation and has refused to take Coumadin in the past. There is mention of a possible Coumadin allergy. We'll need to discuss with patient starting new or anticoagulants. If he is willing to take these, then they would be indicated. He does not appear to have any significant motor deficits. The patient is very anxious to get home. Therapy has yet to see the patient. Anticipate we should be able to discharge him home tomorrow.  MEMON,JEHANZEB

## 2013-07-29 DIAGNOSIS — I639 Cerebral infarction, unspecified: Secondary | ICD-10-CM | POA: Diagnosis present

## 2013-07-29 MED ORDER — RIVAROXABAN 20 MG PO TABS: 20.0000 mg | ORAL_TABLET | Freq: Every day | ORAL | Status: DC

## 2013-07-29 MED ORDER — STROKE: EARLY STAGES OF RECOVERY BOOK
Freq: Once | Status: DC
  Filled 2013-07-29: qty 1

## 2013-07-29 MED ORDER — SIMVASTATIN 20 MG PO TABS: 20.0000 mg | ORAL_TABLET | Freq: Every evening | ORAL | Status: DC

## 2013-07-29 NOTE — Plan of Care (Signed)
Problem: Discharge/Transitional Outcomes Goal: PCP appointment made and transportation plan in place Outcome: Completed/Met Date Met:  07/29/13 07/29/13 1308 [facility] caregiver [facility].   Goal: Ability to attain medications upon leaving hospital Outcome: Completed/Met Date Met:  07/29/13 07/29/13 Caregiver states patient obtains his prescriptions from the [facility] hospital. States they call in prescriptions and usually receive medications within few days. Caregiver asked if patient could continue taking aspirin daily until xarelto prescription received. Dr. Kerry Hough stated okay for patient to take aspirin until xarelto prescription received. Devin Going, RN

## 2013-07-29 NOTE — Discharge Summary (Addendum)
Physician Discharge Summary  THANH MOTTERN ZOX:[id number] DOB: 15-Mar-1928 DOA: 07/28/2013  PCP: Dr. Samuel Jester, also sees PCP at the [facility]  Admit date: 07/28/2013 Discharge date: 07/29/2013  Time spent: 45 minutes  Recommendations for Outpatient Follow-up:  1. Follow up with primary care physician in 1-2 weeks 2. Started on Xarelto for stroke prophylaxis and A fib 3. Digoxin held due to episodes of bradycardia  Discharge Diagnoses:  Principal Problem:   Acute ischemic stroke Active Problems:   OBESITY   HYPERTENSION   ATRIAL FIBRILLATION, CHRONIC   BENIGN PROSTATIC HYPERTROPHY, HX OF   CHF (congestive heart failure)   Yeast dermatitis   Right sided weakness   Discharge Condition: improved  Diet recommendation: low salt   Filed Weights   07/28/13 0940 07/28/13 1030  Weight: 90.719 kg (200 lb) 90.719 kg (200 lb)    History of present illness:  Vincent [name] is a 79 y.o. male with a past medical history that includes A. fib not on any anticoagulation, BPH, hypertension, CHF, urinary retention status post urostomy tube approximately 2 years ago presents to the emergency department with the chief complaint of right-sided weakness. Information is obtained from the patient and his healthcare power of attorney Tyrone Apple via telephone. He reports that about 3 days ago patient began to feel a little dizzy after a visit to his urologist for a urostomy tube change. He indicated that he went to lay down for a while. He took one aspirin. The following day he developed weakness in his right arm and leg. Ms. Darlys Gales reports that he was unable to grip a flashlight with his right hand and she noted some worsening ambulation due to weakness in the right leg. She also reports some mild slurred speech. His baseline is ambulating with a cane intermittently. Patient also indicates some bilateral tingling of his fingers and hands. He denies any chest pain palpitation headache visual  disturbances. He denies any difficulty swallowing. He denies any shortness of breath cough. He denies any fever chills abdominal pain nausea vomiting constipation diarrhea melena. Initial evaluation in the emergency room is unremarkable in terms of his lab work with the exception of alkaline phosphate at 174. CT of the head yields a hypodensity left thalamus consistent with infarct of indeterminate age. This was not present previously. Patient's last hospitalization was June of this year for urinary tract infection. He indicates that he developed right-sided weakness since that time but he did not go to the doctor as he "knew i had a stroke". Symptoms came on gradually have persisted and worsened characterized as mild/moderate. [facility] are asked to admit   Hospital Course:  This patient was admitted to the hospital with right-sided weakness dizziness. He was found to have an acute left pontine infarct. Patient has history of atrial fibrillation and is not on any anticoagulation. He does not have any history of bleeding. He does not wish to be on Coumadin, and there is a questionable allergy to Coumadin. He has not been consistent in taking his regular medications. The patient was admitted to the telemetry unit. MRI confirmed acute stroke. MRA showed diffuse atherosclerotic disease. Carotid Dopplers showed less than 50% stenosis in carotid arteries bilaterally, but images were suboptimal. Recommendations were for CT angiogram neck for better visualization of carotid arteries. Patient declined any further investigations. Lipid panel was checked and LDL was  elevated. Patient was started on a statin. He is willing to take one of the newer anticoagulants. He's been started  on Xarelto. His caregivers report that he does not have history of falls. He does not report any history of bleeding. I do not see a clear contraindication for anticoagulation at this time. He was not felt to be candidate for TPA due to  delay in patient arrival. He was monitored on telemetry and it was noted that his heart rate often decreased into the 30s-40s range. His digoxin has been discontinued. He has declined further inpatient monitoring of his heart rate. The patient is otherwise asymptomatic. He should follow up with his primary care physician for further adjustment of medications if needed. Patient is very adamant for discharge home today. He is asked to followup with his primary care physician.  Procedures: Echo: - Left ventricle: The cavity size was normal. Wall thickness was increased in a pattern of moderate LVH. Systolic function was vigorous. The estimated ejection fraction was in the range of 65% to 70%. Wall motion was normal; there were no regional wall motion abnormalities. - Aortic valve: Moderately calcified annulus. Uncertain number of leaflets; moderately thickened leaflets. There was moderate stenosis. Gradients don't meet criteria likely due to angle of acquition. Mild regurgitation. Valve area: 1.12cm^2(VTI). Valve area: 1.16cm^2 (Vmax). - Mitral valve: Calcified annulus. Mildly thickened leaflets . - Left atrium: The atrium was severely dilated. - Right ventricle: The cavity size was mildly dilated. - Right atrium: The atrium was severely dilated. - Tricuspid valve: Mild-moderate regurgitation. - Pulmonary arteries: Systolic pressure was moderately increased, estimated to be 45mm Hg.     Consultations:  none  Discharge Exam: Filed Vitals:   07/29/13 0910  BP:   Pulse: 61  Temp:   Resp:     General: NAD Cardiovascular: S1, S2 irregular Respiratory: cTA B  Discharge Instructions      Discharge Orders   Future Orders Complete By Expires   Diet - low sodium heart healthy  As directed    Diet Carb Modified  As directed    Increase activity slowly  As directed        Medication List    STOP taking these medications       aspirin EC 81 MG tablet     digoxin 0.25 MG  tablet  Commonly known as:  LANOXIN      TAKE these medications       albuterol 108 (90 BASE) MCG/ACT inhaler  Commonly known as:  PROVENTIL HFA;VENTOLIN HFA  Inhale 2 puffs into the lungs every 6 (six) hours as needed. For asthma     amLODipine 10 MG tablet  Commonly known as:  NORVASC  Take 10 mg by mouth daily.     budesonide-formoterol 80-4.5 MCG/ACT inhaler  Commonly known as:  SYMBICORT  Inhale 2 puffs into the lungs 2 (two) times daily as needed. Shortness of Breath     finasteride 5 MG tablet  Commonly known as:  PROSCAR  Take 5 mg by mouth daily.     furosemide 20 MG tablet  Commonly known as:  LASIX  Take 20 mg by mouth daily.     oxyCODONE-acetaminophen 5-325 MG per tablet  Commonly known as:  PERCOCET/ROXICET  Take 1 tablet by mouth every 4 (four) hours as needed. Pain     Rivaroxaban 20 MG Tabs tablet  Commonly known as:  XARELTO  Take 1 tablet (20 mg total) by mouth daily.     sennosides-docusate sodium 8.6-50 MG tablet  Commonly known as:  SENOKOT-S  Take 1 tablet by mouth daily as needed for  constipation.     simvastatin 20 MG tablet  Commonly known as:  ZOCOR  Take 1 tablet (20 mg total) by mouth every evening.     tamsulosin 0.4 MG Caps capsule  Commonly known as:  FLOMAX  Take 0.4 mg by mouth daily.       No Known Allergies Follow-up Information   Follow up with primary care doctor in 1-2 weeks.       The results of significant diagnostics from this hospitalization (including imaging, microbiology, ancillary and laboratory) are listed below for reference.    Significant Diagnostic Studies: Ct Head Wo Contrast  07/28/2013   *RADIOLOGY REPORT*  Clinical Data: Dizziness and weakness  CT HEAD WITHOUT CONTRAST  Technique:  Contiguous axial images were obtained from the base of the skull through the vertex without contrast.  Comparison: CT head 02/01/2010  Findings: Moderate atrophy.  Left middle cranial fossa arachnoid cyst measures 28 x 41 mm  and is unchanged.  Chronic microvascular ischemic change in the white matter. Hypodensity left lateral thalamus consistent with infarct of indeterminate age.  Negative for hemorrhage.  Atherosclerotic disease in the carotid and vertebral arteries which are calcified.  IMPRESSION: Atrophy and chronic microvascular ischemia.  Hypodensity left thalamus consistent with infarct of indeterminate age.  This was not present previously.  Arachnoid cyst left middle cranial fossa unchanged.   Original Report Authenticated By: Janeece Riggers, M.D.   Mr [facility] Wo Contrast  07/28/2013   CLINICAL DATA:  right-sided weakness, acute onset. Confusion.  EXAM: MRI HEAD WITHOUT CONTRAST  MRA HEAD WITHOUT CONTRAST  TECHNIQUE: Multiplanar, multiecho pulse sequences of the brain and surrounding structures were obtained without intravenous contrast. Angiographic images of the head were obtained using MRA technique without contrast.  COMPARISON:  Head CT same day. MRI 05/29/2004.  FINDINGS: MRI HEAD FINDINGS  Diffusion imaging shows a 7 mm acute infarction within the left pons. The brainstem and cerebellum are unremarkable. The cerebral hemispheres show moderate chronic small vessel disease throughout the deep white matter. There is a chronic arachnoid cyst at the anterior middle cranial fossa on the left which is unchanged. No large vessel territory infarction, mass lesion, hemorrhage or hydrocephalus. No pituitary mass. No inflammatory sinus disease. No skull or skullbase lesion.  Brain atrophy has progressed somewhat since study of 2005. Ventricles are larger and white matter lesions are more numerous.  MRA HEAD FINDINGS  Both internal carotid arteries are patent into the brain. There is atherosclerotic narrowing in both carotid siphon regions. The anterior and middle cerebral vessels are patent bilaterally but show extensive atherosclerotic irregularity. The A1 segment on the right is aplastic, both anterior cerebral arteries receiving  there supply from the carotid circulation.  Both vertebral arteries are patent to the basilar. No basilar stenosis. Posterior circulation branch vessels are patent. More distal branch vessels show atherosclerotic irregularity.  IMPRESSION: MRI HEAD IMPRESSION  7 mm acute infarction within the left pons.  MRA HEAD IMPRESSION  No major vessel occlusion or correctable proximal stenosis. Widespread intracranial atherosclerotic disease of the medium small vessels with diffuse narrowing and irregularity.   Electronically Signed   By: Paulina Fusi M.D.   On: 07/28/2013 16:34   Mr Brain Wo Contrast  07/28/2013   CLINICAL DATA:  right-sided weakness, acute onset. Confusion.  EXAM: MRI HEAD WITHOUT CONTRAST  MRA HEAD WITHOUT CONTRAST  TECHNIQUE: Multiplanar, multiecho pulse sequences of the brain and surrounding structures were obtained without intravenous contrast. Angiographic images of the head were obtained using  MRA technique without contrast.  COMPARISON:  Head CT same day. MRI 05/29/2004.  FINDINGS: MRI HEAD FINDINGS  Diffusion imaging shows a 7 mm acute infarction within the left pons. The brainstem and cerebellum are unremarkable. The cerebral hemispheres show moderate chronic small vessel disease throughout the deep white matter. There is a chronic arachnoid cyst at the anterior middle cranial fossa on the left which is unchanged. No large vessel territory infarction, mass lesion, hemorrhage or hydrocephalus. No pituitary mass. No inflammatory sinus disease. No skull or skullbase lesion.  Brain atrophy has progressed somewhat since study of 2005. Ventricles are larger and white matter lesions are more numerous.  MRA HEAD FINDINGS  Both internal carotid arteries are patent into the brain. There is atherosclerotic narrowing in both carotid siphon regions. The anterior and middle cerebral vessels are patent bilaterally but show extensive atherosclerotic irregularity. The A1 segment on the right is aplastic, both  anterior cerebral arteries receiving there supply from the carotid circulation.  Both vertebral arteries are patent to the basilar. No basilar stenosis. Posterior circulation branch vessels are patent. More distal branch vessels show atherosclerotic irregularity.  IMPRESSION: MRI HEAD IMPRESSION  7 mm acute infarction within the left pons.  MRA HEAD IMPRESSION  No major vessel occlusion or correctable proximal stenosis. Widespread intracranial atherosclerotic disease of the medium small vessels with diffuse narrowing and irregularity.   Electronically Signed   By: Paulina Fusi M.D.   On: 07/28/2013 16:34   US Carotid Duplex Bilateral  07/28/2013   CLINICAL DATA:  Right-sided weakness, history hypertension, CHF, former smoker  EXAM: BILATERAL CAROTID DUPLEX ULTRASOUND  TECHNIQUE: Wallace Cullens scale imaging, color Doppler and duplex ultrasound were performed of bilateral carotid and vertebral arteries in the neck.  COMPARISON:  None  FINDINGS: Criteria: Quantification of carotid stenosis is based on velocity parameters that correlate the residual internal carotid diameter with NASCET-based stenosis levels, using the diameter of the distal internal carotid lumen as the denominator for stenosis measurement.  The following velocity measurements were obtained:  RIGHT  ICA:  52/8 cm/sec  CCA:  72/7 cm/sec  SYSTOLIC ICA/CCA RATIO:  0.71  DIASTOLIC ICA/CCA RATIO:  1.10  ECA:  89 cm/sec  LEFT  ICA:  46/9 cm/sec  CCA:  51/5 cm/sec  SYSTOLIC ICA/CCA RATIO:  0.91  DIASTOLIC ICA/CCA RATIO:  1.89  ECA:  92 cm/sec  RIGHT CAROTID ARTERY: Large calcified plaque at right carotid bulb. Associated turbulent blood flow on color Doppler imaging with spectral broadening on waveform analysis into the proximal right ICA. Additional hypoechoic plaque at proximal right ICA. No high velocity jets.  RIGHT VERTEBRAL ARTERY:  Patent, antegrade  LEFT CAROTID ARTERY: Calcified non shadowing plaque at left CCA. Additional calcified and shadowing plaque at  the left carotid bulb into the proximal left ICA. Associated ulceration is suspected. Turbulent blood flow within left ECA and left ICA on color Doppler imaging with spectral broadening on waveform analysis. Patient arrhythmic during imaging.  LEFT VERTEBRAL ARTERY:  Patent, antegrade  IMPRESSION: Significant plaque formation at the carotid bifurcations bilaterally slightly greater on left.  By velocity measurements, plaques correlate to less than 50% diameter stenoses bilaterally, though portions of the plaque particularly at the left carotid bifurcation and proximal left ICA are calcified and shadowing which may limit assessment.  Ulcerated plaque identified at left carotid bulb/ ICA bifurcation ; if clinically indicated consider CTA imaging with contrast for further assessment.  Patient arrhythmic during imaging.   Electronically Signed   By: Ulyses Southward  M.D.   On: 07/28/2013 17:01    Microbiology: No results found for this or any previous visit (from the past 240 hour(s)).   Labs: Basic Metabolic Panel:  Recent Labs Lab 07/28/13 0954  NA 138  K 4.0  CL 97  CO2 31  GLUCOSE 174*  BUN 21  CREATININE 1.07  CALCIUM 10.5   Liver Function Tests:  Recent Labs Lab 07/28/13 0954  AST 20  ALT 18  ALKPHOS 56  BILITOT 0.7  PROT 8.0  ALBUMIN 4.3   No results found for this basename: LIPASE, AMYLASE,  in the last 168 hours No results found for this basename: AMMONIA,  in the last 168 hours CBC:  Recent Labs Lab 07/28/13 0954  WBC 8.2  NEUTROABS 5.8  HGB 17.1*  HCT 50.5  MCV 90.5  PLT 163   Cardiac Enzymes: No results found for this basename: CKTOTAL, CKMB, CKMBINDEX, TROPONINI,  in the last 168 hours BNP: BNP (last 3 results)  Recent Labs  04/17/13 2349  PROBNP 3622.0*   CBG: No results found for this basename: GLUCAP,  in the last 168 hours     Signed:  MEMON,JEHANZEB  [facility] 07/29/2013, 12:48 PM

## 2013-07-29 NOTE — Progress Notes (Signed)
07/29/13 0811 Patient has been brady with HR in 30s-40s this morning and some during night per night shift RN report. Pt asymptomatic, resting, appeared comfortable. Pt alert and oriented on awakening. Pt reported to have refused am lab draws. Text-paged Dr. Kerry Hough to notify. Earnstine Regal, RN

## 2013-07-29 NOTE — Plan of Care (Signed)
Problem: Progression Outcomes Goal: Bowel & Bladder Continence Outcome: Completed/Met Date Met:  07/29/13 07/29/13 1610 patient has chronic catheter.

## 2013-07-29 NOTE — Progress Notes (Signed)
07/29/13 1130 Patient ambulated in hallway with cane and nurse supervision, states some dizziness on standing, but tolerated ambulation well. Ambulated about 100 feet, stated "that's enough, i've walked all i'm going to walk". Up to chair at this time, chair alarm on for safety. Earnstine Regal, RN

## 2013-07-29 NOTE — Progress Notes (Signed)
Lab technician reported pt has refused his morning blood draw.

## 2013-07-29 NOTE — Progress Notes (Signed)
07/29/13 1311 Reviewed discharge instructions and stroke education booklet with patient and his caregiver via teachback, verbalize understanding. Caregiver [facility] as well. Instructions, medication list, prescriptions, stroke education booklet given. Discussed s/s of stroke and when to call MD. IV site d/c'd this morning, within normal limits. CMT notified of telemetry d/c this morning for discharge. Pt refused lovenox during hospital stay, MD aware. Pt left floor in stable condition via w/c accompanied by nurse tech. Earnstine Regal, RN

## 2013-09-08 ENCOUNTER — Ambulatory Visit (INDEPENDENT_AMBULATORY_CARE_PROVIDER_SITE_OTHER): Payer: Medicare Other | Admitting: Urology

## 2013-09-08 ENCOUNTER — Encounter (INDEPENDENT_AMBULATORY_CARE_PROVIDER_SITE_OTHER): Payer: Self-pay

## 2013-09-08 DIAGNOSIS — R339 Retention of urine, unspecified: Secondary | ICD-10-CM

## 2013-10-06 ENCOUNTER — Encounter (INDEPENDENT_AMBULATORY_CARE_PROVIDER_SITE_OTHER): Payer: Self-pay

## 2013-10-06 ENCOUNTER — Ambulatory Visit (INDEPENDENT_AMBULATORY_CARE_PROVIDER_SITE_OTHER): Payer: Medicare Other | Admitting: Urology

## 2013-10-06 DIAGNOSIS — B372 Candidiasis of skin and nail: Secondary | ICD-10-CM

## 2013-10-06 DIAGNOSIS — R339 Retention of urine, unspecified: Secondary | ICD-10-CM

## 2013-10-09 ENCOUNTER — Emergency Department (HOSPITAL_COMMUNITY): Payer: Medicare Other

## 2013-10-09 ENCOUNTER — Inpatient Hospital Stay (HOSPITAL_COMMUNITY)
Admission: EM | Admit: 2013-10-09 | Discharge: 2013-10-10 | DRG: 698 | Disposition: A | Discharge status: Home / Self Care | Payer: Medicare Other | Attending: Internal Medicine | Admitting: Internal Medicine

## 2013-10-09 ENCOUNTER — Encounter (HOSPITAL_COMMUNITY): Payer: Self-pay | Admitting: Emergency Medicine

## 2013-10-09 DIAGNOSIS — R531 Weakness: Secondary | ICD-10-CM

## 2013-10-09 DIAGNOSIS — N390 Urinary tract infection, site not specified: Secondary | ICD-10-CM

## 2013-10-09 DIAGNOSIS — R0902 Hypoxemia: Secondary | ICD-10-CM

## 2013-10-09 DIAGNOSIS — Z66 Do not resuscitate: Secondary | ICD-10-CM | POA: Diagnosis present

## 2013-10-09 DIAGNOSIS — Z87898 Personal history of other specified conditions: Secondary | ICD-10-CM

## 2013-10-09 DIAGNOSIS — N138 Other obstructive and reflux uropathy: Secondary | ICD-10-CM | POA: Diagnosis present

## 2013-10-09 DIAGNOSIS — M47817 Spondylosis without myelopathy or radiculopathy, lumbosacral region: Secondary | ICD-10-CM | POA: Diagnosis present

## 2013-10-09 DIAGNOSIS — E876 Hypokalemia: Secondary | ICD-10-CM | POA: Diagnosis present

## 2013-10-09 DIAGNOSIS — J438 Other emphysema: Secondary | ICD-10-CM | POA: Diagnosis present

## 2013-10-09 DIAGNOSIS — I498 Other specified cardiac arrhythmias: Secondary | ICD-10-CM | POA: Diagnosis present

## 2013-10-09 DIAGNOSIS — N401 Enlarged prostate with lower urinary tract symptoms: Secondary | ICD-10-CM | POA: Diagnosis present

## 2013-10-09 DIAGNOSIS — I4891 Unspecified atrial fibrillation: Secondary | ICD-10-CM

## 2013-10-09 DIAGNOSIS — Z87891 Personal history of nicotine dependence: Secondary | ICD-10-CM

## 2013-10-09 DIAGNOSIS — I5033 Acute on chronic diastolic (congestive) heart failure: Secondary | ICD-10-CM

## 2013-10-09 DIAGNOSIS — B372 Candidiasis of skin and nail: Secondary | ICD-10-CM | POA: Diagnosis present

## 2013-10-09 DIAGNOSIS — R5381 Other malaise: Secondary | ICD-10-CM | POA: Diagnosis present

## 2013-10-09 DIAGNOSIS — Z7982 Long term (current) use of aspirin: Secondary | ICD-10-CM

## 2013-10-09 DIAGNOSIS — R319 Hematuria, unspecified: Secondary | ICD-10-CM

## 2013-10-09 DIAGNOSIS — I639 Cerebral infarction, unspecified: Secondary | ICD-10-CM

## 2013-10-09 DIAGNOSIS — Y846 Urinary catheterization as the cause of abnormal reaction of the patient, or of later complication, without mention of misadventure at the time of the procedure: Secondary | ICD-10-CM | POA: Diagnosis present

## 2013-10-09 DIAGNOSIS — T502X5A Adverse effect of carbonic-anhydrase inhibitors, benzothiadiazides and other diuretics, initial encounter: Secondary | ICD-10-CM | POA: Diagnosis present

## 2013-10-09 DIAGNOSIS — I509 Heart failure, unspecified: Secondary | ICD-10-CM

## 2013-10-09 DIAGNOSIS — I10 Essential (primary) hypertension: Secondary | ICD-10-CM | POA: Diagnosis present

## 2013-10-09 DIAGNOSIS — E669 Obesity, unspecified: Secondary | ICD-10-CM

## 2013-10-09 DIAGNOSIS — R001 Bradycardia, unspecified: Secondary | ICD-10-CM

## 2013-10-09 DIAGNOSIS — T83511A Infection and inflammatory reaction due to indwelling urethral catheter, initial encounter: Principal | ICD-10-CM | POA: Diagnosis present

## 2013-10-09 DIAGNOSIS — R6510 Systemic inflammatory response syndrome (SIRS) of non-infectious origin without acute organ dysfunction: Secondary | ICD-10-CM

## 2013-10-09 DIAGNOSIS — R339 Retention of urine, unspecified: Secondary | ICD-10-CM | POA: Diagnosis present

## 2013-10-09 LAB — CBC WITH DIFFERENTIAL/PLATELET
Basophils Absolute: 0 10*3/uL (ref 0.0–0.1)
Basophils Relative: 0 % (ref 0–1)
Eosinophils Absolute: 0.1 10*3/uL (ref 0.0–0.7)
Eosinophils Relative: 1 % (ref 0–5)
HCT: 46.5 % (ref 39.0–52.0)
Hemoglobin: 15.8 g/dL (ref 13.0–17.0)
MCHC: 34 g/dL (ref 30.0–36.0)
MCV: 89.9 fL (ref 78.0–100.0)
Monocytes Relative: 9 % (ref 3–12)
Neutro Abs: 10.7 10*3/uL — ABNORMAL HIGH (ref 1.7–7.7)
Neutrophils Relative %: 75 % (ref 43–77)

## 2013-10-09 LAB — URINALYSIS, ROUTINE W REFLEX MICROSCOPIC
Nitrite: POSITIVE — AB
Protein, ur: NEGATIVE mg/dL
Urobilinogen, UA: 1 mg/dL (ref 0.0–1.0)
pH: 7 (ref 5.0–8.0)

## 2013-10-09 LAB — TROPONIN I: Troponin I: <0.3 ng/mL (ref <0.30)

## 2013-10-09 LAB — BASIC METABOLIC PANEL
BUN: 15 mg/dL (ref 6–23)
Chloride: 96 mEq/L (ref 96–112)
GFR calc Af Amer: 75 mL/min — ABNORMAL LOW (ref >90)
Potassium: 3.3 mEq/L — ABNORMAL LOW (ref 3.5–5.1)

## 2013-10-09 LAB — URINE MICROSCOPIC-ADD ON

## 2013-10-09 LAB — DIGOXIN LEVEL: Digoxin Level: 1.5 ng/mL (ref 0.8–2.0)

## 2013-10-09 LAB — PRO B NATRIURETIC PEPTIDE: Pro B Natriuretic peptide (BNP): 2082 pg/mL — ABNORMAL HIGH (ref 0–450)

## 2013-10-09 MED ORDER — IPRATROPIUM BROMIDE 0.02 % IN SOLN
0.5000 mg | Freq: Once | RESPIRATORY_TRACT | Status: AC
  Administered 2013-10-09: 0.5 mg via RESPIRATORY_TRACT
  Filled 2013-10-09: qty 2.5

## 2013-10-09 MED ORDER — ENOXAPARIN SODIUM 40 MG/0.4ML ~~LOC~~ SOLN
40.0000 mg | SUBCUTANEOUS | Status: DC
  Filled 2013-10-09: qty 0.4

## 2013-10-09 MED ORDER — IOHEXOL 350 MG/ML SOLN
100.0000 mL | Freq: Once | INTRAVENOUS | Status: AC | PRN
  Administered 2013-10-09: 100 mL via INTRAVENOUS

## 2013-10-09 MED ORDER — POTASSIUM CHLORIDE CRYS ER 20 MEQ PO TBCR
40.0000 meq | EXTENDED_RELEASE_TABLET | ORAL | Status: AC
  Administered 2013-10-10: 40 meq via ORAL
  Filled 2013-10-09: qty 2

## 2013-10-09 MED ORDER — TAMSULOSIN HCL 0.4 MG PO CAPS
0.4000 mg | ORAL_CAPSULE | Freq: Every day | ORAL | Status: DC
  Administered 2013-10-10: 0.4 mg via ORAL
  Filled 2013-10-09: qty 1

## 2013-10-09 MED ORDER — OXYCODONE-ACETAMINOPHEN 5-325 MG PO TABS: 1.0000 | ORAL_TABLET | ORAL | Status: DC | PRN

## 2013-10-09 MED ORDER — ALBUTEROL SULFATE (5 MG/ML) 0.5% IN NEBU: 2.5000 mg | INHALATION_SOLUTION | RESPIRATORY_TRACT | Status: DC

## 2013-10-09 MED ORDER — SIMVASTATIN 20 MG PO TABS: 20.0000 mg | ORAL_TABLET | Freq: Every evening | ORAL | Status: DC

## 2013-10-09 MED ORDER — AMLODIPINE BESYLATE 5 MG PO TABS
5.0000 mg | ORAL_TABLET | Freq: Every day | ORAL | Status: DC
  Administered 2013-10-10: 5 mg via ORAL
  Filled 2013-10-09: qty 1

## 2013-10-09 MED ORDER — IPRATROPIUM BROMIDE 0.02 % IN SOLN: 0.5000 mg | RESPIRATORY_TRACT | Status: DC

## 2013-10-09 MED ORDER — FINASTERIDE 5 MG PO TABS
5.0000 mg | ORAL_TABLET | Freq: Every day | ORAL | Status: DC
  Administered 2013-10-10: 5 mg via ORAL
  Filled 2013-10-09 (×2): qty 1

## 2013-10-09 MED ORDER — ONDANSETRON HCL 4 MG PO TABS: 4.0000 mg | ORAL_TABLET | Freq: Four times a day (QID) | ORAL | Status: DC | PRN

## 2013-10-09 MED ORDER — ASPIRIN EC 81 MG PO TBEC
81.0000 mg | DELAYED_RELEASE_TABLET | Freq: Every day | ORAL | Status: DC
  Administered 2013-10-10: 81 mg via ORAL
  Filled 2013-10-09: qty 1

## 2013-10-09 MED ORDER — DEXTROSE 5 % IV SOLN
1.0000 g | Freq: Once | INTRAVENOUS | Status: AC
  Administered 2013-10-09: 1 g via INTRAVENOUS
  Filled 2013-10-09: qty 10

## 2013-10-09 MED ORDER — DEXTROSE 5 % IV SOLN
1.0000 g | INTRAVENOUS | Status: DC
  Filled 2013-10-09: qty 10

## 2013-10-09 MED ORDER — FUROSEMIDE 40 MG PO TABS
40.0000 mg | ORAL_TABLET | Freq: Every day | ORAL | Status: DC
  Administered 2013-10-10: 40 mg via ORAL
  Filled 2013-10-09: qty 1

## 2013-10-09 MED ORDER — ALBUTEROL SULFATE (5 MG/ML) 0.5% IN NEBU
2.5000 mg | INHALATION_SOLUTION | Freq: Once | RESPIRATORY_TRACT | Status: AC
  Administered 2013-10-09: 2.5 mg via RESPIRATORY_TRACT
  Filled 2013-10-09: qty 0.5

## 2013-10-09 MED ORDER — DIGOXIN 125 MCG PO TABS: 0.2500 mg | ORAL_TABLET | Freq: Every day | ORAL | Status: DC

## 2013-10-09 MED ORDER — ONDANSETRON HCL 4 MG/2ML IJ SOLN: 4.0000 mg | Freq: Four times a day (QID) | INTRAMUSCULAR | Status: DC | PRN

## 2013-10-09 NOTE — H&P (Signed)
PCP:   Samuel Jester, DO   Chief Complaint:  Generalized weakness  HPI: Vincent [name] who lives by himself and is taken care of by his neighbors who are also the healthcare power of attorney was brought to the hospital after neighbors wife found the patient unable to walk with increasing weakness. Patient is unable to provide any significant history as he  even doesn't remember why he came to the hospital. Though he is alert oriented x3. As per the ED notes patient had shortness of breath, patient denies shortness of breath at this time. Workup in the ED showed elevated BNP, mild vascular congestion on the chest x-ray, negative head CT, and abnormal UA with mild elevated white count. Patient denies chest pain denies shortness of breath. He did have some nausea as per patient's neighbors the patient denies any vomiting or diarrhea. Patient has a history of BPH and has chronic Foley catheter with Foley bag attached to the leg.  Allergies:  No Known Allergies    Past Medical History  Diagnosis Date  . Urine retention   . Atrial fibrillation   . Lumbar spondylosis   . Prostate hypertrophy   . Arachnoid cyst   . Hypertension   . CHF (congestive heart failure)   . UTI (lower urinary tract infection)   . Sepsis     Past Surgical History  Procedure Laterality Date  . Suprapubic catheter placement    . Transurethral resection of prostate    . Renal abscess drainage      Prior to Admission medications   Medication Sig Start Date End Date Taking? Authorizing Provider  albuterol (PROVENTIL HFA;VENTOLIN HFA) 108 (90 BASE) MCG/ACT inhaler Inhale 2 puffs into the lungs every 6 (six) hours as needed. For asthma   Yes Historical Provider, MD  amLODipine (NORVASC) 10 MG tablet Take 5 mg by mouth daily.    Yes Historical Provider, MD  aspirin 81 MG tablet Take 81 mg by mouth daily.   Yes Historical Provider, MD  budesonide-formoterol (SYMBICORT) 80-4.5 MCG/ACT inhaler Inhale 2 puffs into the  lungs 2 (two) times daily as needed. Shortness of Breath    Yes Historical Provider, MD  digoxin (LANOXIN) 0.25 MG tablet Take 0.25 mg by mouth daily.   Yes Historical Provider, MD  finasteride (PROSCAR) 5 MG tablet Take 5 mg by mouth daily.   Yes Historical Provider, MD  furosemide (LASIX) 20 MG tablet Take 20 mg by mouth daily.   Yes Historical Provider, MD  oxyCODONE-acetaminophen (PERCOCET/ROXICET) 5-325 MG per tablet Take 1 tablet by mouth every 4 (four) hours as needed. Pain   Yes Historical Provider, MD  sennosides-docusate sodium (SENOKOT-S) 8.6-50 MG tablet Take 1 tablet by mouth daily as needed for constipation.   Yes Historical Provider, MD  simvastatin (ZOCOR) 20 MG tablet Take 1 tablet (20 mg total) by mouth every evening. 07/29/13  Yes Erick Blinks, MD  tamsulosin (FLOMAX) 0.4 MG CAPS Take 0.4 mg by mouth daily.   Yes Historical Provider, MD    Social History:  reports that he has quit smoking. He does not have any smokeless tobacco history on file. He reports that he does not drink alcohol or use illicit drugs.  No family history on file.   All the positives are listed in BOLD  Review of Systems:  HEENT: Headache, blurred vision, runny nose, sore throat Neck: Hypothyroidism, hyperthyroidism,,lymphadenopathy Chest : Shortness of breath, history of COPD, Asthma Heart : Chest pain, history of coronary arterey disease, atrial fibrillation  GI:  Nausea, vomiting, diarrhea, constipation, GERD GU: Dysuria, urgency, frequency of urination, hematuria, BPH Neuro: Stroke, seizures, syncope Psych: Depression, anxiety, hallucinations   Physical Exam: Blood pressure 113/59, pulse 67, temperature 99 F (37.2 C), temperature source Oral, resp. rate 14, SpO2 89.00%. Constitutional:   Patient is a well-developed and well-nourished [name]* in no acute distress and cooperative with exam. Head: Normocephalic and atraumatic Mouth: Mucus membranes moist Eyes: PERRL, EOMI, conjunctivae  normal Neck: Supple, No Thyromegaly Cardiovascular: RRR, S1 normal, S2 normal Pulmonary/Chest: CTAB, no wheezes, rales, or rhonchi Abdominal: Soft. Non-tender, non-distended, bowel sounds are normal, no masses, organomegaly, or guarding present.  Neurological: A&O x3, Strenght is normal and symmetric bilaterally, cranial nerve II-XII are grossly intact, no focal motor deficit, sensory intact to light touch bilaterally.  Extremities : No Cyanosis, Clubbing or Edema   Labs on Admission:  Results for orders placed during the hospital encounter of 10/09/13 (from the past 48 hour(s))  CBC WITH DIFFERENTIAL     Status: Abnormal   Collection Time    10/09/13  7:13 PM      Result Value Range   WBC 14.3 (*) 4.0 - 10.5 K/uL   RBC 5.17  4.22 - 5.81 MIL/uL   Hemoglobin 15.8  13.0 - 17.0 g/dL   HCT 47.8  29.5 - 62.1 %   MCV 89.9  78.0 - 100.0 fL   MCH 30.6  26.0 - 34.0 pg   MCHC 34.0  30.0 - 36.0 g/dL   RDW 30.8  65.7 - 84.6 %   Platelets 213  150 - 400 K/uL   Neutrophils Relative % 75  43 - Vincent %   Neutro Abs 10.7 (*) 1.7 - 7.7 K/uL   Lymphocytes Relative 15  12 - 46 %   Lymphs Abs 2.2  0.7 - 4.0 K/uL   Monocytes Relative 9  3 - 12 %   Monocytes Absolute 1.3 (*) 0.1 - 1.0 K/uL   Eosinophils Relative 1  0 - 5 %   Eosinophils Absolute 0.1  0.0 - 0.7 K/uL   Basophils Relative 0  0 - 1 %   Basophils Absolute 0.0  0.0 - 0.1 K/uL  BASIC METABOLIC PANEL     Status: Abnormal   Collection Time    10/09/13  7:13 PM      Result Value Range   Sodium 136  135 - 145 mEq/L   Potassium 3.3 (*) 3.5 - 5.1 mEq/L   Chloride 96  96 - 112 mEq/L   CO2 31  19 - 32 mEq/L   Glucose, Bld 171 (*) 70 - 99 mg/dL   BUN 15  6 - 23 mg/dL   Creatinine, Ser 9.62  0.50 - 1.35 mg/dL   Calcium 9.6  8.4 - 95.2 mg/dL   GFR calc non Af Amer 64 (*) >90 mL/min   GFR calc Af Amer 75 (*) >90 mL/min   Comment: (NOTE)     The eGFR has been calculated using the CKD EPI equation.     This calculation has not been validated in  all clinical situations.     eGFR's persistently <90 mL/min signify possible Chronic Kidney     Disease.  TROPONIN I     Status: None   Collection Time    10/09/13  7:13 PM      Result Value Range   Troponin I <0.30  <0.30 ng/mL   Comment:            Due to  the release kinetics of cTnI,     a negative result within the first hours     of the onset of symptoms does not rule out     myocardial infarction with certainty.     If myocardial infarction is still suspected,     repeat the test at appropriate intervals.  PRO B NATRIURETIC PEPTIDE     Status: Abnormal   Collection Time    10/09/13  7:13 PM      Result Value Range   Pro B Natriuretic peptide (BNP) 2082.0 (*) 0 - 450 pg/mL  D-DIMER, QUANTITATIVE     Status: Abnormal   Collection Time    10/09/13  7:13 PM      Result Value Range   D-Dimer, Quant 1.07 (*) 0.00 - 0.48 ug/mL-FEU   Comment:            AT THE INHOUSE ESTABLISHED CUTOFF     VALUE OF 0.48 ug/mL FEU,     THIS ASSAY HAS BEEN DOCUMENTED     IN THE LITERATURE TO HAVE     A SENSITIVITY AND NEGATIVE     PREDICTIVE VALUE OF AT LEAST     98 TO 99%.  THE TEST RESULT     SHOULD BE CORRELATED WITH     AN ASSESSMENT OF THE CLINICAL     PROBABILITY OF DVT / VTE.  DIGOXIN LEVEL     Status: None   Collection Time    10/09/13  7:13 PM      Result Value Range   Digoxin Level 1.5  0.8 - 2.0 ng/mL  URINALYSIS, ROUTINE W REFLEX MICROSCOPIC     Status: Abnormal   Collection Time    10/09/13  8:45 PM      Result Value Range   Color, Urine YELLOW  YELLOW   APPearance CLEAR  CLEAR   Specific Gravity, Urine 1.010  1.005 - 1.030   pH 7.0  5.0 - 8.0   Glucose, UA NEGATIVE  NEGATIVE mg/dL   Hgb urine dipstick MODERATE (*) NEGATIVE   Bilirubin Urine NEGATIVE  NEGATIVE   Ketones, ur NEGATIVE  NEGATIVE mg/dL   Protein, ur NEGATIVE  NEGATIVE mg/dL   Urobilinogen, UA 1.0  0.0 - 1.0 mg/dL   Nitrite POSITIVE (*) NEGATIVE   Leukocytes, UA LARGE (*) NEGATIVE  URINE MICROSCOPIC-ADD  ON     Status: Abnormal   Collection Time    10/09/13  8:45 PM      Result Value Range   Squamous Epithelial / LPF RARE  RARE   WBC, UA TOO NUMEROUS TO COUNT  <3 WBC/hpf   RBC / HPF 3-6  <3 RBC/hpf   Bacteria, UA MANY (*) RARE    Radiological Exams on Admission: Ct Head Wo Contrast  10/09/2013   CLINICAL DATA:  Weakness in both legs.  History of arachnoid cyst.  EXAM: CT HEAD WITHOUT CONTRAST  TECHNIQUE: Contiguous axial images were obtained from the base of the skull through the vertex without intravenous contrast.  COMPARISON:  Multiple exams, including 07/28/2013  FINDINGS: Stable left middle cranial fossa arachnoid cyst. Stable small remote left alignment lacunar infarct.  Otherwise, the brainstem, cerebellum, cerebral peduncles, thalamus, basal ganglia, basilar cisterns, and ventricular system appear within normal limits. No intracranial hemorrhage, mass lesion, or acute CVA. Atherosclerotic calcification of the carotid siphons noted.  IMPRESSION: 1. No acute intracranial findings. 2. Stable appearance of left middle cranial fossa arachnoid cyst and small prior left thalamic lacunar infarct.  Electronically Signed   By: Herbie Baltimore M.D.   On: 10/09/2013 20:58   Ct Angio Chest Pe W/cm &/or Wo Cm  10/09/2013   CLINICAL DATA:  Shortness of breath  EXAM: CT ANGIOGRAPHY CHEST WITH CONTRAST  TECHNIQUE: Multidetector CT imaging of the chest was performed using the standard protocol during bolus administration of intravenous contrast. Multiplanar CT image reconstructions including MIPs were obtained to evaluate the vascular anatomy.  CONTRAST:  OMNIPAQUE IOHEXOL 350 MG/ML SOLN  COMPARISON:  None.  FINDINGS: Lungs are well aerated bilaterally. Very mild dependent atelectatic changes are seen. Mild emphysematous changes are noted as well. The thoracic aorta shows significant calcification without aneurysmal dilatation. The pulmonary artery is well visualized without evidence of filling defect  to suggest pulmonary embolism. No significant hilar or mediastinal adenopathy is noted. Coronary calcifications are seen. A mild pericardial effusion is noted.  Scanning into the upper abdomen reveals no focal abnormality. The osseous structures show degenerative change of the thoracic spine. There also noted changes of fluid within the esophagus likely related to reflux disease.  Review of the MIP images confirms the above findings.  IMPRESSION: No evidence of pulmonary embolism.  Mild chronic changes within both lungs.  Likely reflux disease.   Electronically Signed   By: Alcide Clever M.D.   On: 10/09/2013 21:45   Dg Chest Portable 1 View  10/09/2013   CLINICAL DATA:  Shortness of breath  EXAM: PORTABLE CHEST - 1 VIEW  COMPARISON:  04/17/2013  FINDINGS: Cardiac shadow is mildly enlarged. The lungs are clear bilaterally. Mild vascular congestion is seen. No acute bony abnormality is noted.  IMPRESSION: Mild vascular congestion. No significant edema is seen at this time.   Electronically Signed   By: Alcide Clever M.D.   On: 10/09/2013 18:55    Assessment/Plan Active Problems:   HYPERTENSION   Urine retention   CHF (congestive heart failure)   UTI (lower urinary tract infection)  Vincent [name] who lives by himself came to the hospital generalized weakness and shortness of breath. Workup reveals possible UTI. Patient has already received one dose of Rocephin in the ED, we'll continue the Rocephin and await urine culture results. Patient also has a history of CHF, he does take Lasix at home as BNP is elevated to 2320 I will change the Lasix to 40 mg once a day as chest x-ray only shows mild respiratory condition but no significant edema. Last echo showed EF of 65%. He also has some emphysematous changes seen on the CT chest to start him on DuoNeb nebulizers. We'll replace potassium and monitor the patient in telemetry. We'll continue digoxin at this time. Patient has a chronic Foley catheter has a  history of BPH.  Code status: Patient is DO NOT RESUSCITATE  Family discussion: Discussed with patient's healthcare power of attorney Eliane Decree on telephone   Time Spent on Admission: 70 min  [facility] Pager: [phone] 10/09/2013, 10:39 PM  If 7PM-7AM, please contact night-coverage  [email or web address]  Password [email or web address]

## 2013-10-09 NOTE — ED Provider Notes (Signed)
CSN: [id number]     Arrival date & time 10/09/13  1726 History   First MD Initiated Contact with Patient 10/09/13 1821     Chief Complaint  Patient presents with  . Shortness of Breath  . Weakness   (Consider location/radiation/quality/duration/timing/severity/associated sxs/prior Treatment) HPI Comments: Vincent [name] is a 79 y.o. male who is here for evaluation of weakness in both legs. The weakness started several days ago. He also has shortness of breath, that has not improved with his home inhalers. He lives alone. His son is here with him. He did not eat today because he wasn't hungry. He has not had any cough, headache, back pain, change in bowel or urinary habits. He is using his usual medications, without relief. There are no other known modifying factors.  Patient is a 79 y.o. male presenting with shortness of breath and weakness. The history is provided by the patient and a relative.  Shortness of Breath Weakness Associated symptoms include shortness of breath.    Past Medical History  Diagnosis Date  . Urine retention   . Atrial fibrillation   . Lumbar spondylosis   . Prostate hypertrophy   . Arachnoid cyst   . Hypertension   . CHF (congestive heart failure)   . UTI (lower urinary tract infection)   . Sepsis    Past Surgical History  Procedure Laterality Date  . Suprapubic catheter placement    . Transurethral resection of prostate    . Renal abscess drainage     No family history on file. History  Substance Use Topics  . Smoking status: Former Games developer  . Smokeless tobacco: Not on file  . Alcohol Use: No    Review of Systems  Respiratory: Positive for shortness of breath.   Neurological: Positive for weakness.  All other systems reviewed and are negative.    Allergies  Review of patient's allergies indicates no known allergies.  Home Medications   Current Outpatient Rx  Name  Route  Sig  Dispense  Refill  . albuterol (PROVENTIL HFA;VENTOLIN  HFA) 108 (90 BASE) MCG/ACT inhaler   Inhalation   Inhale 2 puffs into the lungs every 6 (six) hours as needed. For asthma         . amLODipine (NORVASC) 10 MG tablet   Oral   Take 5 mg by mouth daily.          Marland Kitchen aspirin 81 MG tablet   Oral   Take 81 mg by mouth daily.         . budesonide-formoterol (SYMBICORT) 80-4.5 MCG/ACT inhaler   Inhalation   Inhale 2 puffs into the lungs 2 (two) times daily as needed. Shortness of Breath          . digoxin (LANOXIN) 0.25 MG tablet   Oral   Take 0.25 mg by mouth daily.         . finasteride (PROSCAR) 5 MG tablet   Oral   Take 5 mg by mouth daily.         . furosemide (LASIX) 20 MG tablet   Oral   Take 20 mg by mouth daily.         Marland Kitchen oxyCODONE-acetaminophen (PERCOCET/ROXICET) 5-325 MG per tablet   Oral   Take 1 tablet by mouth every 4 (four) hours as needed. Pain         . sennosides-docusate sodium (SENOKOT-S) 8.6-50 MG tablet   Oral   Take 1 tablet by mouth daily as needed for constipation.         Marland Kitchen  simvastatin (ZOCOR) 20 MG tablet   Oral   Take 1 tablet (20 mg total) by mouth every evening.   30 tablet   1   . tamsulosin (FLOMAX) 0.4 MG CAPS   Oral   Take 0.4 mg by mouth daily.          BP 146/78  Pulse 69  Temp(Src) 99 F (37.2 C) (Oral)  Resp 18  SpO2 94% Physical Exam  Nursing note and vitals reviewed. Constitutional: He is oriented to person, place, and time. He appears well-developed and well-nourished.  HENT:  Head: Normocephalic and atraumatic.  Right Ear: External ear normal.  Left Ear: External ear normal.  Eyes: Conjunctivae and EOM are normal. Pupils are equal, round, and reactive to light.  Neck: Normal range of motion and phonation normal. Neck supple.  Cardiovascular: Normal rate, regular rhythm, normal heart sounds and intact distal pulses.   Pulmonary/Chest: Effort normal and breath sounds normal. He exhibits no bony tenderness.  Abdominal: Soft. Normal appearance. There is  no tenderness.  Musculoskeletal: Normal range of motion.  Neurological: He is alert and oriented to person, place, and time. No cranial nerve deficit or sensory deficit. He exhibits normal muscle tone. Coordination normal.  Strength normal with the exception of the right leg has weakness at 3/5, compared to normal on the left.  Skin: Skin is warm, dry and intact.  Psychiatric: He has a normal mood and affect. His behavior is normal. Judgment and thought content normal.    ED Course  Procedures (including critical care time)  Medications  cefTRIAXone (ROCEPHIN) 1 g in dextrose 5 % 50 mL IVPB (not administered)  albuterol (PROVENTIL) (5 MG/ML) 0.5% nebulizer solution 2.5 mg (2.5 mg Nebulization Given 10/09/13 1854)  ipratropium (ATROVENT) nebulizer solution 0.5 mg (0.5 mg Nebulization Given 10/09/13 1854)  iohexol (OMNIPAQUE) 350 MG/ML injection 100 mL (100 mLs Intravenous Contrast Given 10/09/13 2125)    Patient Vitals for the past 24 hrs:  BP Temp Temp src Pulse Resp SpO2  10/09/13 2023 - 99 F (37.2 C) - - - -  10/09/13 1800 146/78 mmHg - - 69 - 94 %  10/09/13 1730 144/80 mmHg 99.2 F (37.3 C) Oral 148 18 93 %     9:35 PM Reevaluation with update and discussion. After initial assessment and treatment, an updated evaluation reveals at this time. He has improved and normalized strength in his right leg. Danel Requena L   CT chest, angiogram, ordered to evaluate for pulmonary embolus     Labs Review Labs Reviewed  CBC WITH DIFFERENTIAL - Abnormal; Notable for the following:    WBC 14.3 (*)    Neutro Abs 10.7 (*)    Monocytes Absolute 1.3 (*)    All other components within normal limits  BASIC METABOLIC PANEL - Abnormal; Notable for the following:    Potassium 3.3 (*)    Glucose, Bld 171 (*)    GFR calc non Af Amer 64 (*)    GFR calc Af Amer 75 (*)    All other components within normal limits  PRO B NATRIURETIC PEPTIDE - Abnormal; Notable for the following:    Pro B  Natriuretic peptide (BNP) 2082.0 (*)    All other components within normal limits  URINALYSIS, ROUTINE W REFLEX MICROSCOPIC - Abnormal; Notable for the following:    Hgb urine dipstick MODERATE (*)    Nitrite POSITIVE (*)    Leukocytes, UA LARGE (*)    All other components within normal limits  D-DIMER,  QUANTITATIVE - Abnormal; Notable for the following:    D-Dimer, Quant 1.07 (*)    All other components within normal limits  URINE MICROSCOPIC-ADD ON - Abnormal; Notable for the following:    Bacteria, UA MANY (*)    All other components within normal limits  URINE CULTURE  TROPONIN I  DIGOXIN LEVEL   Imaging Review Ct Head Wo Contrast  10/09/2013   CLINICAL DATA:  Weakness in both legs.  History of arachnoid cyst.  EXAM: CT HEAD WITHOUT CONTRAST  TECHNIQUE: Contiguous axial images were obtained from the base of the skull through the vertex without intravenous contrast.  COMPARISON:  Multiple exams, including 07/28/2013  FINDINGS: Stable left middle cranial fossa arachnoid cyst. Stable small remote left alignment lacunar infarct.  Otherwise, the brainstem, cerebellum, cerebral peduncles, thalamus, basal ganglia, basilar cisterns, and ventricular system appear within normal limits. No intracranial hemorrhage, mass lesion, or acute CVA. Atherosclerotic calcification of the carotid siphons noted.  IMPRESSION: 1. No acute intracranial findings. 2. Stable appearance of left middle cranial fossa arachnoid cyst and small prior left thalamic lacunar infarct.   Electronically Signed   By: Herbie Baltimore M.D.   On: 10/09/2013 20:58   Ct Angio Chest Pe W/cm &/or Wo Cm  10/09/2013   CLINICAL DATA:  Shortness of breath  EXAM: CT ANGIOGRAPHY CHEST WITH CONTRAST  TECHNIQUE: Multidetector CT imaging of the chest was performed using the standard protocol during bolus administration of intravenous contrast. Multiplanar CT image reconstructions including MIPs were obtained to evaluate the vascular anatomy.   CONTRAST:  OMNIPAQUE IOHEXOL 350 MG/ML SOLN  COMPARISON:  None.  FINDINGS: Lungs are well aerated bilaterally. Very mild dependent atelectatic changes are seen. Mild emphysematous changes are noted as well. The thoracic aorta shows significant calcification without aneurysmal dilatation. The pulmonary artery is well visualized without evidence of filling defect to suggest pulmonary embolism. No significant hilar or mediastinal adenopathy is noted. Coronary calcifications are seen. A mild pericardial effusion is noted.  Scanning into the upper abdomen reveals no focal abnormality. The osseous structures show degenerative change of the thoracic spine. There also noted changes of fluid within the esophagus likely related to reflux disease.  Review of the MIP images confirms the above findings.  IMPRESSION: No evidence of pulmonary embolism.  Mild chronic changes within both lungs.  Likely reflux disease.   Electronically Signed   By: Alcide Clever M.D.   On: 10/09/2013 21:45   Dg Chest Portable 1 View  10/09/2013   CLINICAL DATA:  Shortness of breath  EXAM: PORTABLE CHEST - 1 VIEW  COMPARISON:  04/17/2013  FINDINGS: Cardiac shadow is mildly enlarged. The lungs are clear bilaterally. Mild vascular congestion is seen. No acute bony abnormality is noted.  IMPRESSION: Mild vascular congestion. No significant edema is seen at this time.   Electronically Signed   By: Alcide Clever M.D.   On: 10/09/2013 18:55    EKG Interpretation    Date/Time:  Monday October 09 2013 18:44:57 EST Ventricular Rate:  68 PR Interval:    QRS Duration: 90 QT Interval:  360 QTC Calculation: 382 R Axis:   46 Text Interpretation:  Atrial fibrillation Possible Anterior infarct (cited on or before 02-Mar-2011) ST \\T \ T wave abnormality, consider inferolateral ischemia Abnormal ECG When compared with ECG of 28-Jul-2013 09:34, No significant change was found Confirmed by Effie Shy  MD, Srinika Delone ([id number]) on 10/09/2013 8:03:25 PM             MDM  1. UTI (lower urinary tract infection)   2. Weakness     Weakness in a chronic debilitated man, with urinary tract infection. No clear evidence for CVA. He had transient altered strength in the right leg, but no altered mental status or right arm weakness. Possible TIA. No clear evidence for pneumonia. He required evaluation for PE, as a cause of his weakness. There is no evidence for PE. The patient lives alone in his weakness, prevents walking. He'll need to be admitted for observation, stabilization and further treatment.   Nursing Notes Reviewed/ Care Coordinated, and agree without changes. Applicable Imaging Reviewed.  Interpretation of Laboratory Data incorporated into ED treatment   Plan: Admit  Flint Melter, MD 10/09/13 2211

## 2013-10-09 NOTE — ED Notes (Signed)
Pt arrived via ems, c/o weakness in both legs.  Today, unable to walk.  Pt also c/o SOB x 2 or 3 days.

## 2013-10-10 DIAGNOSIS — R001 Bradycardia, unspecified: Secondary | ICD-10-CM

## 2013-10-10 DIAGNOSIS — T889XXS Complication of surgical and medical care, unspecified, sequela: Secondary | ICD-10-CM

## 2013-10-10 DIAGNOSIS — I5033 Acute on chronic diastolic (congestive) heart failure: Secondary | ICD-10-CM

## 2013-10-10 DIAGNOSIS — I498 Other specified cardiac arrhythmias: Secondary | ICD-10-CM

## 2013-10-10 LAB — CBC
MCV: 89.7 fL (ref 78.0–100.0)
Platelets: 189 10*3/uL (ref 150–400)
RBC: 5.04 MIL/uL (ref 4.22–5.81)
WBC: 14.5 10*3/uL — ABNORMAL HIGH (ref 4.0–10.5)

## 2013-10-10 LAB — COMPREHENSIVE METABOLIC PANEL
ALT: 8 U/L (ref 0–53)
AST: 12 U/L (ref 0–37)
Albumin: 3.4 g/dL — ABNORMAL LOW (ref 3.5–5.2)
Alkaline Phosphatase: 55 U/L (ref 39–117)
BUN: 15 mg/dL (ref 6–23)
CO2: 28 mEq/L (ref 19–32)
Chloride: 98 mEq/L (ref 96–112)
GFR calc non Af Amer: 63 mL/min — ABNORMAL LOW (ref >90)
Potassium: 3.7 mEq/L (ref 3.5–5.1)
Sodium: 135 mEq/L (ref 135–145)
Total Bilirubin: 1.1 mg/dL (ref 0.3–1.2)
Total Protein: 6.9 g/dL (ref 6.0–8.3)

## 2013-10-10 MED ORDER — POTASSIUM CHLORIDE ER 20 MEQ PO TBCR: 20.0000 meq | EXTENDED_RELEASE_TABLET | Freq: Every day | ORAL | Status: DC

## 2013-10-10 MED ORDER — FUROSEMIDE 40 MG PO TABS: 40.0000 mg | ORAL_TABLET | Freq: Every day | ORAL | Status: DC

## 2013-10-10 MED ORDER — IPRATROPIUM BROMIDE 0.02 % IN SOLN
0.5000 mg | Freq: Two times a day (BID) | RESPIRATORY_TRACT | Status: DC
  Administered 2013-10-10: 0.5 mg via RESPIRATORY_TRACT
  Filled 2013-10-10: qty 2.5

## 2013-10-10 MED ORDER — NYSTATIN 100000 UNIT/GM EX CREA: 1.0000 "application " | TOPICAL_CREAM | Freq: Two times a day (BID) | CUTANEOUS | Status: DC

## 2013-10-10 MED ORDER — IPRATROPIUM BROMIDE 0.02 % IN SOLN: 0.5000 mg | Freq: Two times a day (BID) | RESPIRATORY_TRACT | Status: DC

## 2013-10-10 MED ORDER — STERILE WATER FOR INJECTION IJ SOLN
INTRAMUSCULAR | Status: AC
  Filled 2013-10-10: qty 10

## 2013-10-10 MED ORDER — ALBUTEROL SULFATE (5 MG/ML) 0.5% IN NEBU: 2.5000 mg | INHALATION_SOLUTION | RESPIRATORY_TRACT | Status: DC | PRN

## 2013-10-10 MED ORDER — CIPROFLOXACIN HCL 500 MG PO TABS: 500.0000 mg | ORAL_TABLET | Freq: Two times a day (BID) | ORAL | Status: DC

## 2013-10-10 MED ORDER — ALBUTEROL SULFATE (5 MG/ML) 0.5% IN NEBU
2.5000 mg | INHALATION_SOLUTION | Freq: Two times a day (BID) | RESPIRATORY_TRACT | Status: DC
  Administered 2013-10-10: 2.5 mg via RESPIRATORY_TRACT
  Filled 2013-10-10: qty 0.5

## 2013-10-10 MED ORDER — ALBUTEROL SULFATE (5 MG/ML) 0.5% IN NEBU: 2.5000 mg | INHALATION_SOLUTION | RESPIRATORY_TRACT | Status: DC

## 2013-10-10 NOTE — Progress Notes (Signed)
Pt discharged home today per Dr. Malachi Bonds. Pt's IV site D/C'd and WNL. Pt's VS stable at this time. Pt provided with home medicaiton list, discharge instructions and instructed on where to pick up medications.  Verbalized understanding.  Pt's suprapubic catheter changed per MD order prior to discharge. Pt tolerated well with no complaints.  Pt's POA at bedside at time of discharge. Verbalized understanding of the aforementioned. Pt left floor in stable condition accompanied by NT via WC.

## 2013-10-10 NOTE — Evaluation (Signed)
Occupational Therapy Evaluation Patient Details Name: Vincent [name] MRN: [id number] DOB: [birth year] Today's Date: 10/10/2013 Time: 7829-5621 OT Time Calculation (min): 17 min Evaluation Only   OT Assessment / Plan / Recommendation History of present illness Patient is a n 79 yo right handed male who presented to hospital with increased shortness of breath, increased weakness, UTI.  Patient is a DNR.     Clinical Impression   Patient presents sitting edge of bed just completing breakfast.  He presents at/near baseline for all ADL tasks. He received assistance for IADL tasks from neighbors and is very anxious to return home at this time.  He is able to ambulate in/around room without use of AE during this evaluation as he declines and also declines to fully participate in ADL assessment.  Recommend no skilled services at this time secondary to patient does not appear to be far from baseline, demos independence in tasks assessed and declines farther services.      OT Assessment   (patient demos/states he is at baseline for ADL tasks.  )    Follow Up Recommendations  No OT follow up    Barriers to Discharge  lives alone             Precautions / Restrictions Precautions Precautions: Fall Restrictions Weight Bearing Restrictions: No    ADL  Eating/Feeding: Modified independent (weard dentures ) Upper Body Dressing: Set up Lower Body Dressing: Set up;Min guard Toilet Transfer: Modified independent ADL Comments: patient assessed simulated as he declines to complete full tasks.     OT Diagnosis:  weaknesss       OT Goals(Current goals can be found in the care plan section) Acute Rehab OT Goals Patient Stated Goal: to return home   Visit Information  History of Present Illness: Patient is a n 79 yo right handed male who presented to hospital with increased shortness of breath, increased weakness, UTI.  Patient is a DNR.         Prior Functioning     Home  Living Family/patient expects to be discharged to:: Private residence Living Arrangements: Alone Available Help at Discharge: Friend(s);Available PRN/intermittently Type of Home: Mobile home Home Access: Stairs to enter Entrance Stairs-Number of Steps: 3 Entrance Stairs-Rails: Right;Left;Can reach both Home Layout: One level Home Equipment: Walker - 2 wheels;Cane - single point Prior Function Level of Independence: Independent with assistive device(s) Communication Communication: No difficulties Dominant Hand: Right         Vision/Perception Vision - History Baseline Vision: Wears glasses all the time Patient Visual Report: No change from baseline Vision - Assessment Eye Alignment: Within Functional Limits   Cognition  Cognition Arousal/Alertness: Awake/alert Behavior During Therapy: WFL for tasks assessed/performed Overall Cognitive Status: Within Functional Limits for tasks assessed (patient does not know why he came to hospital . signs of short term memory difficulties, no one present to attain information from ) Memory: Decreased short-term memory    Extremity/Trunk Assessment Upper Extremity Assessment Upper Extremity Assessment: Overall WFL for tasks assessed;Generalized weakness Lower Extremity Assessment Lower Extremity Assessment: Defer to PT evaluation     Mobility Transfers Transfers: Sit to Stand;Stand to Sit Sit to Stand: 6: Modified independent (Device/Increase time) Stand to Sit: 7: Independent           End of Session OT - End of Session Activity Tolerance: Patient tolerated treatment well Patient left: with call bell/phone within reach (seated edge of bed )  GO     Velora Mediate, OTR/L 10/10/2013,  8:44 AM

## 2013-10-10 NOTE — Progress Notes (Signed)
Inpatient Diabetes Program Recommendations  AACE/ADA: New Consensus Statement on Inpatient Glycemic Control (2013)  Target Ranges:  Prepandial:   less than 140 mg/dL      Peak postprandial:   less than 180 mg/dL (1-2 hours)      Critically ill patients:  140 - 180 mg/dL   Results for Vincent [name], Vincent [name] (MRN [id number]) as of 10/10/2013 08:08  Ref. Range 04/18/2013 04:38  Hemoglobin A1C Latest Range: <5.7 % 8.4 (H)  Results for Vincent [name], Vincent [name] (MRN [id number]) as of 10/10/2013 08:08  Ref. Range 07/28/2013 10:00  Hemoglobin A1C Latest Range: <5.7 % 8.3 (H)  Results for Vincent [name], Vincent [name] (MRN [id number]) as of 10/10/2013 08:08  Ref. Range 10/09/2013 19:13 10/10/2013 05:39  Glucose Latest Range: 70-99 mg/dL 086 (H) 578 (H)    Inpatient Diabetes Program Recommendations Correction (SSI): Please consider ordering CBGs ACHS with Novolog correction scale. HgbA1C: Please consider ordering an A1C to determine glycemic control over the past 2-3 months. Diet:  Please consider adding Carb Modified to current Heart Healthy diet.  Note: In reviewing the patient's chart there is not a documented history of diabetes.  However, A1C was 8.4% on 04/18/2013 and 8.3% on 07/28/2013.  Per ADA, if A1C is 6.5% or greater criteria for diabetes diagnosis is met.  Initial lab glucose was 171 mg/dl on 46/96 @ 29:52 and fasting glucose this morning was 185 mg/dl.  Please consider ordering an A1C, CBGs with Novolog correction ACHS, and adding Carb Modified to current heart healthy diet.  MD - Please inform staff if diagnosis of diabetes is given to the patient so he can be educated.  Will continue to follow.  Thanks, Orlando Penner, RN, MSN, CCRN Diabetes Coordinator Inpatient Diabetes Program [phone] (Team Pager) [phone] ([place] office) [phone] [facility] ([place]) office)

## 2013-10-10 NOTE — Evaluation (Signed)
Physical Therapy Evaluation Patient Details Name: Vincent [name] MRN: [id number] DOB: 1928-08-04 Today's Date: 10/10/2013 Time: 4098-1191 PT Time Calculation (min): 17 min  PT Assessment / Plan / Recommendation History of Present Illness  Pt is admitted with a UTI.  He c/o dyspnea and weakness.  POA states that this has been a frequent problem but that he has been doing fairly well at home until now, ambulating with a cane.  Clinical Impression   Pt had been sleeping at the time of my arrival.  His POA was present and assisted me to awaken him and convince him to at least walk for me.  He was quite angry about this but did demonstrate reasonable stability with a cane.  He is not amenable to any PT intervention.  POA lives next door and keeps a close watch over him.    PT Assessment  Patent does not need any further PT services    Follow Up Recommendations  No PT follow up    Does the patient have the potential to tolerate intense rehabilitation      Barriers to Discharge        Equipment Recommendations  None recommended by PT    Recommendations for Other Services     Frequency      Precautions / Restrictions Precautions Precautions: Fall Restrictions Weight Bearing Restrictions: No   Pertinent Vitals/Pain       Mobility  Bed Mobility Bed Mobility: Supine to Sit;Sit to Supine Supine to Sit: 6: Modified independent (Device/Increase time);HOB flat Sit to Supine: 6: Modified independent (Device/Increase time);HOB flat Transfers Transfers: Sit to Stand;Stand to Sit Sit to Stand: 6: Modified independent (Device/Increase time);From bed Stand to Sit: 6: Modified independent (Device/Increase time);To bed Ambulation/Gait Ambulation/Gait Assistance: 5: Supervision Ambulation Distance (Feet): 25 Feet Assistive device: Straight cane Gait Pattern: Within Functional Limits Gait velocity: rapid pace General Gait Details: pt was very angry about having to get OOB to  walk...he could have walked further but he refused to. Stairs: No Wheelchair Mobility Wheelchair Mobility: No    Exercises     PT Diagnosis:    PT Problem List:   PT Treatment Interventions:       PT Goals(Current goals can be found in the care plan section) Acute Rehab PT Goals Patient Stated Goal: to return home  PT Goal Formulation: No goals set, d/c therapy  Visit Information  Last PT Received On: 10/10/13 History of Present Illness: Pt is admitted with a UTI.  He c/o dyspnea and weakness.  POA states that this has been a frequent problem but that he has been doing fairly well at home until now, ambulating with a cane.       Prior Functioning  Home Living Family/patient expects to be discharged to:: Private residence Living Arrangements: Alone Available Help at Discharge: Friend(s);Available PRN/intermittently Type of Home: Mobile home Home Access: Stairs to enter Entrance Stairs-Number of Steps: 3 Entrance Stairs-Rails: Right;Left;Can reach both Home Layout: One level Home Equipment: Walker - 2 wheels;Cane - single point Prior Function Level of Independence: Independent with assistive device(s) Communication Communication: No difficulties Dominant Hand: Right    Cognition  Cognition Arousal/Alertness: Awake/alert Behavior During Therapy: WFL for tasks assessed/performed Overall Cognitive Status: Within Functional Limits for tasks assessed Memory: Decreased recall of precautions    Extremity/Trunk Assessment Upper Extremity Assessment Upper Extremity Assessment: Overall WFL for tasks assessed;Generalized weakness Lower Extremity Assessment Lower Extremity Assessment: Overall WFL for tasks assessed   Balance Balance Balance Assessed: No [facility]  by functional observation)  End of Session PT - End of Session Equipment Utilized During Treatment: Gait belt Activity Tolerance: Patient tolerated treatment well Patient left: in bed;with call bell/phone within  reach;with bed alarm set;with family/visitor present  GP     Myrlene Broker L 10/10/2013, 9:49 AM

## 2013-10-10 NOTE — Discharge Summary (Signed)
Physician Discharge Summary  Vincent [name] [name] DOB: 04/21/1928 DOA: 10/09/2013  PCP: Vincent Jester, DO  Admit date: 10/09/2013 Discharge date: 10/10/2013  Recommendations for Outpatient Follow-up:  1. Followup with primary care doctor in one week to check heart rate and blood pressure, followup on urine culture which is pending at the time of discharge. If heart rate is rising to high 90s and 100s range, consider restarting digoxin at a lower dose.  Repeat BMP in one week after increasing Lasix and adding potassium.  Please also repeat CBC in one week to verify that leukocytosis is resolving.    Discharge Diagnoses:  Principal Problem:   Catheter-associated urinary tract infection due to indwelling suprapubic catheter Active Problems:   HYPERTENSION   Urine retention   Yeast dermatitis   Bradycardia   Acute on chronic diastolic heart failure   Discharge Condition: stable, improved   Diet recommendation: Healthy heart, low sodium  Wt Readings from Last 3 Encounters:  10/09/13 99.066 kg (218 lb 6.4 oz)  07/28/13 90.719 kg (200 lb)  04/19/13 101.8 kg (224 lb 6.9 oz)    History of present illness:  79 year old male who lives by himself and is taken care of by his neighbors who are also the healthcare power of attorney was brought to the hospital after neighbors wife found the patient unable to walk with increasing weakness. Patient is unable to provide any significant history as he even doesn't remember why he came to the hospital. Though he is alert oriented x3. As per the ED notes patient had shortness of breath, patient denies shortness of breath at this time. Workup in the ED showed elevated BNP, mild vascular congestion on the chest x-ray, negative head CT, and abnormal UA with mild elevated white count.  Patient denies chest pain denies shortness of breath. He did have some nausea as per patient's neighbors the patient denies any vomiting or diarrhea. Patient has  a history of BPH and has chronic Foley catheter with Foley bag attached to the leg.  Hospital Course:   Generalized weakness: This is likely multifactorial. He appears to have a urinary tract infection, and also had some acute on chronic diastolic heart failure exacerbation.  His troponin was negative and his ECG was stable from prior.  He was given increased Lasix and diuresed approximately 1.2 L.  His shortness of breath improved.  He is encouraged to eat a low salt diet and continue lasix 40mg  daily with potassium daily.  He was also started on empiric antibiotics for UTI. He was seen by physical therapy to recommended that he did not need any further followup her appointment. His caretaker is present at bedside and states that the patient appears to be back to his baseline this morning.  Urinary tract infection due to chronic indwelling suprapubic catheter, and suggested by positive nitrites, large leukocyte esterase, large bacteria, too numerous to count white blood cells. He has history of several different organisms, including Proteus, Klebsiella, and enterococcus. Most of these have been sensitive to at least one antibiotic. He appears to be responding to ceftriaxone, so he will be discharged on ciprofloxacin to complete a 10-14 day course. His suprapubic catheter will be exchanged prior to him being discharged.  His urine culture is pending at the time of discharge, and his primary care physician can followup on the final report and adjust his antibiotics if necessary.  Emphysema, COPD. He was started on DuoNeb treatments, which seemed to mildly improve his strength of breath.  He should continue his home inhalers.  Atrial fibrillation with bradycardia to the mid 30s, increase in heart rate when awake and with exertion.  He appears to be asymptomatic, however his bradycardia may also be contributing to his generalized weakness.  His digitoxin level was 1.5. Recommend stopping his digoxin and  rechecking his heart rates in one week. His caretakers are advised to check his heart rate at home, and if his heart rate starts rising to the 100s range, they should restart his digoxin prior to the end of the week.  ASA for stroke prophylaxis due to falls risk.    Hypokalemia: Likely secondary to Lasix use. Was given prescription for potassium chloride 20 mEq daily. Recommend repeat BMP in one week.  Leukocytosis, likely related to UTI.  Repeat in 1 week.   Procedures:  CT angio chest  CT head  CXR  Consultations:  None  Discharge Exam: Filed Vitals:   10/10/13 0646  BP: 150/88  Pulse: 75  Temp: 97.8 F (36.6 C)  Resp:    Filed Vitals:   10/09/13 2200 10/09/13 2300 10/10/13 0646 10/10/13 0755  BP: 113/59 158/80 150/88   Pulse: 67 75 75   Temp:  97.4 F (36.3 C) 97.8 F (36.6 C)   TempSrc:  Oral Oral   Resp:      Height:  5\' 6"  (1.676 m)    Weight:  99.066 kg (218 lb 6.4 oz)    SpO2: 93% 97% 96% 92%    General: CM, NAD HEENT:  NCAT, MMM Cardiovascular:  IRRR, bradycardic to the 40s on exam, 1/6 systolic murmur at the LSB, no rubs or gallops, 2+ pulses Respiratory: Diminished at the bilateral bases, but otherwise CTAB, no increased WOB ABD:  NABS, soft, nondistended, mildly TTP in the suprapubic region Skin:  Mild dry and flaking erythema around the lower abdomen previously being treated with nystatin MSK:  No LEE  Discharge Instructions      Discharge Orders   Future Orders Complete By Expires   (HEART FAILURE PATIENTS) Call MD:  Anytime you have any of the following symptoms: 1) 3 pound weight gain in 24 hours or 5 pounds in 1 week 2) shortness of breath, with or without a dry hacking cough 3) swelling in the hands, feet or stomach 4) if you have to sleep on extra pillows at night in order to breathe.  As directed    Call MD for:  difficulty breathing, headache or visual disturbances  As directed    Call MD for:  extreme fatigue  As directed    Call MD for:   hives  As directed    Call MD for:  persistant dizziness or light-headedness  As directed    Call MD for:  persistant nausea and vomiting  As directed    Call MD for:  severe uncontrolled pain  As directed    Call MD for:  temperature >100.4  As directed    Diet - low sodium heart healthy  As directed    Discharge instructions  As directed    Comments:     Mr. Arns was hospitalized with weakness.  He was found to have mild heart failure, urinary tract infection, and slow heart rate.  His lasix dose was increased and because lasix can lower potassium, he was given a prescription for additional daily potassium.  Please try to weigh him daily and if he gains more than 3-lbs in one day or 5-lbs in a week, talk to  his primary care doctor.  Please also make sure his doctor checks his potassium level and kidney function in one week.  For his urinary tract infection, a urine culture is pending at the time of discharge.  He is being discharged with ciprofloxacin twice daily, which is effective against most infection.  If his urine culture grows bacteria resistant to this antibiotic, a new prescription may need to be called in.  He should also follow up with his urologist.  For his slow heart rate with occasional pauses, he should stop his digoxin.  His primary care doctor should check his heart rate in one week and if it is becoming too fast, he could restart his digoxin but perhaps at a lower dose.   Increase activity slowly  As directed        Medication List    STOP taking these medications       digoxin 0.25 MG tablet  Commonly known as:  LANOXIN      TAKE these medications       albuterol 108 (90 BASE) MCG/ACT inhaler  Commonly known as:  PROVENTIL HFA;VENTOLIN HFA  Inhale 2 puffs into the lungs every 6 (six) hours as needed. For asthma     amLODipine 10 MG tablet  Commonly known as:  NORVASC  Take 5 mg by mouth daily.     aspirin 81 MG tablet  Take 81 mg by mouth daily.      budesonide-formoterol 80-4.5 MCG/ACT inhaler  Commonly known as:  SYMBICORT  Inhale 2 puffs into the lungs 2 (two) times daily as needed. Shortness of Breath     ciprofloxacin 500 MG tablet  Commonly known as:  CIPRO  Take 1 tablet (500 mg total) by mouth 2 (two) times daily.     finasteride 5 MG tablet  Commonly known as:  PROSCAR  Take 5 mg by mouth daily.     furosemide 40 MG tablet  Commonly known as:  LASIX  Take 1 tablet (40 mg total) by mouth daily.     nystatin cream  Commonly known as:  MYCOSTATIN  Apply 1 application topically 2 (two) times daily. Apply to abdominal rash     oxyCODONE-acetaminophen 5-325 MG per tablet  Commonly known as:  PERCOCET/ROXICET  Take 1 tablet by mouth every 4 (four) hours as needed. Pain     Potassium Chloride ER 20 MEQ Tbcr  Take 20 mEq by mouth daily.     sennosides-docusate sodium 8.6-50 MG tablet  Commonly known as:  SENOKOT-S  Take 1 tablet by mouth daily as needed for constipation.     simvastatin 20 MG tablet  Commonly known as:  ZOCOR  Take 1 tablet (20 mg total) by mouth every evening.     tamsulosin 0.4 MG Caps capsule  Commonly known as:  FLOMAX  Take 0.4 mg by mouth daily.       Follow-up Information   Follow up with BUTLER, CYNTHIA, DO. Schedule an appointment as soon as possible for a visit in 1 week.   Contact information:   [address] [place] [address] [phone]        The results of significant diagnostics from this hospitalization (including imaging, microbiology, ancillary and laboratory) are listed below for reference.    Significant Diagnostic Studies: Ct Head Wo Contrast  10/09/2013   CLINICAL DATA:  Weakness in both legs.  History of arachnoid cyst.  EXAM: CT HEAD WITHOUT CONTRAST  TECHNIQUE: Contiguous axial images were obtained from  the base of the skull through the vertex without intravenous contrast.  COMPARISON:  Multiple exams, including 07/28/2013  FINDINGS: Stable left middle  cranial fossa arachnoid cyst. Stable small remote left alignment lacunar infarct.  Otherwise, the brainstem, cerebellum, cerebral peduncles, thalamus, basal ganglia, basilar cisterns, and ventricular system appear within normal limits. No intracranial hemorrhage, mass lesion, or acute CVA. Atherosclerotic calcification of the carotid siphons noted.  IMPRESSION: 1. No acute intracranial findings. 2. Stable appearance of left middle cranial fossa arachnoid cyst and small prior left thalamic lacunar infarct.   Electronically Signed   By: Herbie Baltimore M.D.   On: 10/09/2013 20:58   Ct Angio Chest Pe W/cm &/or Wo Cm  10/09/2013   CLINICAL DATA:  Shortness of breath  EXAM: CT ANGIOGRAPHY CHEST WITH CONTRAST  TECHNIQUE: Multidetector CT imaging of the chest was performed using the standard protocol during bolus administration of intravenous contrast. Multiplanar CT image reconstructions including MIPs were obtained to evaluate the vascular anatomy.  CONTRAST:  OMNIPAQUE IOHEXOL 350 MG/ML SOLN  COMPARISON:  None.  FINDINGS: Lungs are well aerated bilaterally. Very mild dependent atelectatic changes are seen. Mild emphysematous changes are noted as well. The thoracic aorta shows significant calcification without aneurysmal dilatation. The pulmonary artery is well visualized without evidence of filling defect to suggest pulmonary embolism. No significant hilar or mediastinal adenopathy is noted. Coronary calcifications are seen. A mild pericardial effusion is noted.  Scanning into the upper abdomen reveals no focal abnormality. The osseous structures show degenerative change of the thoracic spine. There also noted changes of fluid within the esophagus likely related to reflux disease.  Review of the MIP images confirms the above findings.  IMPRESSION: No evidence of pulmonary embolism.  Mild chronic changes within both lungs.  Likely reflux disease.   Electronically Signed   By: Alcide Clever M.D.   On: 10/09/2013  21:45   Dg Chest Portable 1 View  10/09/2013   CLINICAL DATA:  Shortness of breath  EXAM: PORTABLE CHEST - 1 VIEW  COMPARISON:  04/17/2013  FINDINGS: Cardiac shadow is mildly enlarged. The lungs are clear bilaterally. Mild vascular congestion is seen. No acute bony abnormality is noted.  IMPRESSION: Mild vascular congestion. No significant edema is seen at this time.   Electronically Signed   By: Alcide Clever M.D.   On: 10/09/2013 18:55    Microbiology: No results found for this or any previous visit (from the past 240 hour(s)).   Labs: Basic Metabolic Panel:  Recent Labs Lab 10/09/13 1913 10/10/13 0539  NA 136 135  K 3.3* 3.7  CL 96 98  CO2 31 28  GLUCOSE 171* 185*  BUN 15 15  CREATININE 1.04 1.06  CALCIUM 9.6 9.4   Liver Function Tests:  Recent Labs Lab 10/10/13 0539  AST 12  ALT 8  ALKPHOS 55  BILITOT 1.1  PROT 6.9  ALBUMIN 3.4*   No results found for this basename: LIPASE, AMYLASE,  in the last 168 hours No results found for this basename: AMMONIA,  in the last 168 hours CBC:  Recent Labs Lab 10/09/13 1913 10/10/13 0539  WBC 14.3* 14.5*  NEUTROABS 10.7*  --   HGB 15.8 15.5  HCT 46.5 45.2  MCV 89.9 89.7  PLT 213 189   Cardiac Enzymes:  Recent Labs Lab 10/09/13 1913  TROPONINI <0.30   BNP: BNP (last 3 results)  Recent Labs  04/17/13 2349 10/09/13 1913  PROBNP 3622.0* 2082.0*   CBG: No results found for  this basename: GLUCAP,  in the last 168 hours  Time coordinating discharge:  45 minutes  Signed:  Covey Baller  [facility] 10/10/2013, 10:55 AM

## 2013-10-10 NOTE — Progress Notes (Signed)
UR chart review completed.  

## 2013-10-10 NOTE — Progress Notes (Signed)
INITIAL NUTRITION ASSESSMENT  DOCUMENTATION CODES Per approved criteria  -Not Applicable   INTERVENTION: Heart Healthy diet  NUTRITION DIAGNOSIS: Inadequate oral intake related to acute ischemic stroke as evidenced by admission diagnosis.   Goal: Pt to meet >/= 90% of their estimated nutrition needs   Monitor:  Po intake, labs and wt trends  Reason for Assessment: Malnutrition Screen Score = 3  79 y.o. male   ASSESSMENT: pt being discharged home in stable condition on Low Sodium/Heart healthy diet.  Patient Active Problem List   Diagnosis Date Noted  . Acute ischemic stroke 07/29/2013  . Right sided weakness 07/28/2013  . Yeast dermatitis 04/19/2013  . UTI (lower urinary tract infection) 04/17/2013  . Hematuria 04/17/2013  . SIRS (systemic inflammatory response syndrome) 04/17/2013  . Hypoxia 04/17/2013  . Urine retention   . CHF (congestive heart failure)   . OBESITY 03/18/2010  . HYPERTENSION 03/12/2010  . ATRIAL FIBRILLATION, CHRONIC 03/12/2010  . BENIGN PROSTATIC HYPERTROPHY, HX OF 03/12/2010    Height: Ht Readings from Last 1 Encounters:  10/09/13 5\' 6"  (1.676 m)    Weight: Wt Readings from Last 1 Encounters:  10/09/13 218 lb 6.4 oz (99.066 kg)    Ideal Body Weight:  142# (64.5 kg)  % Ideal Body Weight: 154%  Wt Readings from Last 10 Encounters:  10/09/13 218 lb 6.4 oz (99.066 kg)  07/28/13 200 lb (90.719 kg)  04/19/13 224 lb 6.9 oz (101.8 kg)  09/22/12 260 lb (117.935 kg)  07/12/12 260 lb (117.935 kg)  04/25/12 260 lb (117.935 kg)  04/07/12 260 lb (117.935 kg)  04/05/12 260 lb (117.935 kg)  11/15/11 260 lb (117.935 kg)  11/13/11 260 lb (117.935 kg)    Usual Body Weight: 224#  % Usual Body Weight: 97%  BMI:  Body mass index is 35.27 kg/(m^2).obesity class II  Estimated Nutritional Needs: Kcal: 1900-2100 Protein: 80-95 gr Fluid: >2000 ml daily  Skin: intact  Diet Order: Cardiac  EDUCATION NEEDS: -No education needs identified at  this time   Intake/Output Summary (Last 24 hours) at 10/10/13 0858 Last data filed at 10/10/13 0648  Gross per 24 hour  Intake      0 ml  Output   1200 ml  Net  -1200 ml    Last BM: 10/09/13  Labs:   Recent Labs Lab 10/09/13 1913 10/10/13 0539  NA 136 135  K 3.3* 3.7  CL 96 98  CO2 31 28  BUN 15 15  CREATININE 1.04 1.06  CALCIUM 9.6 9.4  GLUCOSE 171* 185*    CBG (last 3)  No results found for this basename: GLUCAP,  in the last 72 hours  Scheduled Meds: . albuterol  2.5 mg Nebulization BID   And  . ipratropium  0.5 mg Nebulization BID  . amLODipine  5 mg Oral Daily  . aspirin EC  81 mg Oral Daily  . cefTRIAXone (ROCEPHIN)  IV  1 g Intravenous Q24H  . digoxin  0.25 mg Oral Daily  . enoxaparin (LOVENOX) injection  40 mg Subcutaneous Q24H  . finasteride  5 mg Oral Daily  . furosemide  40 mg Oral Daily  . simvastatin  20 mg Oral QPM  . tamsulosin  0.4 mg Oral Daily    Continuous Infusions:   Past Medical History  Diagnosis Date  . Urine retention   . Atrial fibrillation   . Lumbar spondylosis   . Prostate hypertrophy   . Arachnoid cyst   . Hypertension   . CHF (  congestive heart failure)   . UTI (lower urinary tract infection)   . Sepsis     Past Surgical History  Procedure Laterality Date  . Suprapubic catheter placement    . Transurethral resection of prostate    . Renal abscess drainage      Royann Shivers MS,RD,CSG,LDN Office: [phone] Pager: [phone]

## 2013-10-11 LAB — URINE CULTURE: Colony Count: >=100000

## 2013-11-07 ENCOUNTER — Ambulatory Visit (INDEPENDENT_AMBULATORY_CARE_PROVIDER_SITE_OTHER): Payer: Medicare Other | Admitting: Urology

## 2013-11-07 DIAGNOSIS — R339 Retention of urine, unspecified: Secondary | ICD-10-CM

## 2013-12-01 ENCOUNTER — Ambulatory Visit (INDEPENDENT_AMBULATORY_CARE_PROVIDER_SITE_OTHER): Payer: Medicare Other | Admitting: Urology

## 2013-12-01 DIAGNOSIS — R338 Other retention of urine: Secondary | ICD-10-CM

## 2013-12-01 DIAGNOSIS — B372 Candidiasis of skin and nail: Secondary | ICD-10-CM

## 2014-01-02 ENCOUNTER — Emergency Department (HOSPITAL_COMMUNITY): Payer: Medicare Other

## 2014-01-02 ENCOUNTER — Encounter (HOSPITAL_COMMUNITY): Payer: Self-pay | Admitting: Emergency Medicine

## 2014-01-02 ENCOUNTER — Emergency Department (HOSPITAL_COMMUNITY)
Admission: EM | Admit: 2014-01-02 | Discharge: 2014-01-02 | Disposition: A | Discharge status: Home / Self Care | Payer: Medicare Other | Attending: Emergency Medicine | Admitting: Emergency Medicine

## 2014-01-02 DIAGNOSIS — Z8619 Personal history of other infectious and parasitic diseases: Secondary | ICD-10-CM | POA: Insufficient documentation

## 2014-01-02 DIAGNOSIS — Z87440 Personal history of urinary (tract) infections: Secondary | ICD-10-CM | POA: Insufficient documentation

## 2014-01-02 DIAGNOSIS — Z79899 Other long term (current) drug therapy: Secondary | ICD-10-CM | POA: Insufficient documentation

## 2014-01-02 DIAGNOSIS — Z792 Long term (current) use of antibiotics: Secondary | ICD-10-CM | POA: Insufficient documentation

## 2014-01-02 DIAGNOSIS — Z8739 Personal history of other diseases of the musculoskeletal system and connective tissue: Secondary | ICD-10-CM | POA: Insufficient documentation

## 2014-01-02 DIAGNOSIS — Z7982 Long term (current) use of aspirin: Secondary | ICD-10-CM | POA: Insufficient documentation

## 2014-01-02 DIAGNOSIS — I10 Essential (primary) hypertension: Secondary | ICD-10-CM | POA: Insufficient documentation

## 2014-01-02 DIAGNOSIS — Z8673 Personal history of transient ischemic attack (TIA), and cerebral infarction without residual deficits: Secondary | ICD-10-CM | POA: Insufficient documentation

## 2014-01-02 DIAGNOSIS — Z87448 Personal history of other diseases of urinary system: Secondary | ICD-10-CM | POA: Insufficient documentation

## 2014-01-02 DIAGNOSIS — I509 Heart failure, unspecified: Secondary | ICD-10-CM | POA: Insufficient documentation

## 2014-01-02 DIAGNOSIS — Z87891 Personal history of nicotine dependence: Secondary | ICD-10-CM | POA: Insufficient documentation

## 2014-01-02 DIAGNOSIS — Z8669 Personal history of other diseases of the nervous system and sense organs: Secondary | ICD-10-CM | POA: Insufficient documentation

## 2014-01-02 DIAGNOSIS — J209 Acute bronchitis, unspecified: Secondary | ICD-10-CM | POA: Insufficient documentation

## 2014-01-02 MED ORDER — AZITHROMYCIN 250 MG PO TABS: ORAL_TABLET | ORAL | Status: DC

## 2014-01-02 MED ORDER — GUAIFENESIN-CODEINE 100-10 MG/5ML PO SOLN
5.0000 mL | Freq: Four times a day (QID) | ORAL | Status: DC | PRN
Start: 2014-01-02 — End: 2016-03-18

## 2014-01-02 MED ORDER — ALBUTEROL SULFATE (2.5 MG/3ML) 0.083% IN NEBU
2.5000 mg | INHALATION_SOLUTION | Freq: Once | RESPIRATORY_TRACT | Status: AC
  Administered 2014-01-02: 2.5 mg via RESPIRATORY_TRACT
  Filled 2014-01-02: qty 3

## 2014-01-02 MED ORDER — IPRATROPIUM-ALBUTEROL 0.5-2.5 (3) MG/3ML IN SOLN
3.0000 mL | Freq: Once | RESPIRATORY_TRACT | Status: AC
  Administered 2014-01-02: 3 mL via RESPIRATORY_TRACT
  Filled 2014-01-02: qty 3

## 2014-01-02 NOTE — ED Provider Notes (Signed)
CSN: [id number]     Arrival date & time 01/02/14  0930 History   First MD Initiated Contact with Patient 01/02/14 [phone]     Chief Complaint  Patient presents with  . Cough  . Fatigue     (Consider location/radiation/quality/duration/timing/severity/associated sxs/prior Treatment) HPI Comments: Patient is an 80 year old male with history of atrial fibrillation, CVA, indwelling Foley catheter. He presents today with complaints of chest congestion and cough past several days. He feels fatigued. He denies any he is having chest discomfort. He denies fevers.  Patient is a 80 y.o. male presenting with cough. The history is provided by the patient.  Cough Cough characteristics:  Non-productive Severity:  Moderate Onset quality:  Gradual Duration:  3 days Timing:  Constant Progression:  Worsening Chronicity:  New Smoker: no   Relieved by:  Nothing Worsened by:  Nothing tried   Past Medical History  Diagnosis Date  . Urine retention   . Atrial fibrillation   . Lumbar spondylosis   . Prostate hypertrophy   . Arachnoid cyst   . Hypertension   . CHF (congestive heart failure)   . UTI (lower urinary tract infection)   . Sepsis    Past Surgical History  Procedure Laterality Date  . Suprapubic catheter placement    . Transurethral resection of prostate    . Renal abscess drainage     History reviewed. No pertinent family history. History  Substance Use Topics  . Smoking status: Former Games developer  . Smokeless tobacco: Never Used  . Alcohol Use: No    Review of Systems  Respiratory: Positive for cough.   All other systems reviewed and are negative.      Allergies  Review of patient's allergies indicates no known allergies.  Home Medications   Current Outpatient Rx  Name  Route  Sig  Dispense  Refill  . albuterol (PROVENTIL HFA;VENTOLIN HFA) 108 (90 BASE) MCG/ACT inhaler   Inhalation   Inhale 2 puffs into the lungs every 6 (six) hours as needed. For asthma         .  amLODipine (NORVASC) 10 MG tablet   Oral   Take 5 mg by mouth daily.          Marland Kitchen aspirin 81 MG tablet   Oral   Take 81 mg by mouth daily.         . budesonide-formoterol (SYMBICORT) 80-4.5 MCG/ACT inhaler   Inhalation   Inhale 2 puffs into the lungs 2 (two) times daily as needed. Shortness of Breath          . ciprofloxacin (CIPRO) 500 MG tablet   Oral   Take 1 tablet (500 mg total) by mouth 2 (two) times daily.   20 tablet   0   . finasteride (PROSCAR) 5 MG tablet   Oral   Take 5 mg by mouth daily.         . furosemide (LASIX) 40 MG tablet   Oral   Take 1 tablet (40 mg total) by mouth daily.   30 tablet   0   . nystatin cream (MYCOSTATIN)   Topical   Apply 1 application topically 2 (two) times daily. Apply to abdominal rash   30 g   0   . oxyCODONE-acetaminophen (PERCOCET/ROXICET) 5-325 MG per tablet   Oral   Take 1 tablet by mouth every 4 (four) hours as needed. Pain         . potassium chloride 20 MEQ TBCR   Oral  Take 20 mEq by mouth daily.   30 tablet   0   . sennosides-docusate sodium (SENOKOT-S) 8.6-50 MG tablet   Oral   Take 1 tablet by mouth daily as needed for constipation.         . simvastatin (ZOCOR) 20 MG tablet   Oral   Take 1 tablet (20 mg total) by mouth every evening.   30 tablet   1   . tamsulosin (FLOMAX) 0.4 MG CAPS   Oral   Take 0.4 mg by mouth daily.          BP 122/66  Pulse 66  Temp(Src) 98 F (36.7 C) (Oral)  Resp 26  Ht 5\' 6"  (1.676 m)  Wt 260 lb (117.935 kg)  BMI 41.99 kg/m2  SpO2 92% Physical Exam  Nursing note and vitals reviewed. Constitutional: He is oriented to person, place, and time. He appears well-developed and well-nourished. No distress.  HENT:  Head: Normocephalic and atraumatic.  Mouth/Throat: Oropharynx is clear and moist.  Neck: Normal range of motion. Neck supple.  Cardiovascular: Normal rate, regular rhythm and normal heart sounds.   No murmur heard. Pulmonary/Chest: Effort normal  and breath sounds normal. No respiratory distress. He has no wheezes.  Abdominal: Soft. Bowel sounds are normal. He exhibits no distension. There is no tenderness.  Musculoskeletal: Normal range of motion. He exhibits no edema.  Lymphadenopathy:    He has no cervical adenopathy.  Neurological: He is alert and oriented to person, place, and time.  Skin: Skin is warm and dry. He is not diaphoretic.    ED Course  Procedures (including critical care time) Labs Review Labs Reviewed - No data to display Imaging Review Dg Chest Port 1 View  01/02/2014   CLINICAL DATA:  Cough and shortness of Breath  EXAM: PORTABLE CHEST - 1 VIEW  COMPARISON:  10/09/2013  FINDINGS: Cardiac shadow remains enlarged. Some patchy changes are noted left midlung and right lung base consistent with atelectasis. No focal confluent infiltrate is seen.  IMPRESSION: Patchy atelectasis bilaterally. No focal confluent infiltrate is noted.   Electronically Signed   By: Alcide CleverMark  Lukens M.D.   On: 01/02/2014 10:08     Date: 01/02/2014  Rate: 66  Rhythm: atrial fibrillation  QRS Axis: normal  Intervals: normal  ST/T Wave abnormalities: nonspecific T wave changes  Conduction Disutrbances:none  Narrative Interpretation:   Old EKG Reviewed: unchanged    MDM   Final diagnoses:  None    Patient is an 80 year old male with past medical history of A. fib. He presents with chest congestion and cough for the past several days. His symptoms sound infectious in nature, however chest x-ray does not reveal an acute infiltrate. I suspect this more of a bronchitis which I will treat with Zithromax. His EKG is unchanged from prior studies and is not describing any chest discomfort. He is adamant about not having blood work performed and I have comfortable with this. He will be discharged home with antibiotics and advised to return if his symptoms worsen or change.    Geoffery Lyonsouglas Demarlo Riojas, MD 01/02/14 1019

## 2014-01-02 NOTE — Discharge Instructions (Signed)
Zithromax as prescribed. Robitussin with codeine as prescribed as needed for cough.  Return to the emergency department if you develop chest pain or difficulty breathing.     Acute Bronchitis Bronchitis is inflammation of the airways that extend from the windpipe into the lungs (bronchi). The inflammation often causes mucus to develop. This leads to a cough, which is the most common symptom of bronchitis.  In acute bronchitis, the condition usually develops suddenly and goes away over time, usually in a couple weeks. Smoking, allergies, and asthma can make bronchitis worse. Repeated episodes of bronchitis may cause further lung problems.  CAUSES Acute bronchitis is most often caused by the same virus that causes a cold. The virus can spread from person to person (contagious).  SIGNS AND SYMPTOMS   Cough.   Fever.   Coughing up mucus.   Body aches.   Chest congestion.   Chills.   Shortness of breath.   Sore throat.  DIAGNOSIS  Acute bronchitis is usually diagnosed through a physical exam. Tests, such as chest X-rays, are sometimes done to rule out other conditions.  TREATMENT  Acute bronchitis usually goes away in a couple weeks. Often times, no medical treatment is necessary. Medicines are sometimes given for relief of fever or cough. Antibiotics are usually not needed but may be prescribed in certain situations. In some cases, an inhaler may be recommended to help reduce shortness of breath and control the cough. A cool mist vaporizer may also be used to help thin bronchial secretions and make it easier to clear the chest.  HOME CARE INSTRUCTIONS  Get plenty of rest.   Drink enough fluids to keep your urine clear or pale yellow (unless you have a medical condition that requires fluid restriction). Increasing fluids may help thin your secretions and will prevent dehydration.   Only take over-the-counter or prescription medicines as directed by your health care  provider.   Avoid smoking and secondhand smoke. Exposure to cigarette smoke or irritating chemicals will make bronchitis worse. If you are a smoker, consider using nicotine gum or skin patches to help control withdrawal symptoms. Quitting smoking will help your lungs heal faster.   Reduce the chances of another bout of acute bronchitis by washing your hands frequently, avoiding people with cold symptoms, and trying not to touch your hands to your mouth, nose, or eyes.   Follow up with your health care provider as directed.  SEEK MEDICAL CARE IF: Your symptoms do not improve after 1 week of treatment.  SEEK IMMEDIATE MEDICAL CARE IF:  You develop an increased fever or chills.   You have chest pain.   You have severe shortness of breath.  You have bloody sputum.   You develop dehydration.  You develop fainting.  You develop repeated vomiting.  You develop a severe headache. MAKE SURE YOU:   Understand these instructions.  Will watch your condition.  Will get help right away if you are not doing well or get worse. Document Released: 12/10/2004 Document Revised: 07/05/2013 Document Reviewed: 04/25/2013 [facility] Patient Information 2014 [place], [place].

## 2014-01-02 NOTE — ED Notes (Signed)
Patient c/o chest congestion with nonproductive cough and fatigue. Patient reports some chest tightness and is short of breath. Patient also reports some blurry vision and "slight headache."

## 2014-01-05 ENCOUNTER — Ambulatory Visit (INDEPENDENT_AMBULATORY_CARE_PROVIDER_SITE_OTHER): Payer: Medicare Other | Admitting: Urology

## 2014-01-05 DIAGNOSIS — R339 Retention of urine, unspecified: Secondary | ICD-10-CM

## 2014-02-09 ENCOUNTER — Ambulatory Visit (INDEPENDENT_AMBULATORY_CARE_PROVIDER_SITE_OTHER): Payer: Medicare Other | Admitting: Urology

## 2014-02-09 DIAGNOSIS — R339 Retention of urine, unspecified: Secondary | ICD-10-CM

## 2014-02-13 ENCOUNTER — Ambulatory Visit (INDEPENDENT_AMBULATORY_CARE_PROVIDER_SITE_OTHER): Payer: Medicare Other | Admitting: Urology

## 2014-02-13 DIAGNOSIS — R339 Retention of urine, unspecified: Secondary | ICD-10-CM

## 2014-02-20 ENCOUNTER — Inpatient Hospital Stay (HOSPITAL_COMMUNITY)
Admission: EM | Admit: 2014-02-20 | Discharge: 2014-02-22 | DRG: 698 | Disposition: A | Discharge status: Home Health | Payer: Medicare Other | Attending: Internal Medicine | Admitting: Internal Medicine

## 2014-02-20 ENCOUNTER — Encounter (HOSPITAL_COMMUNITY): Payer: Self-pay | Admitting: Emergency Medicine

## 2014-02-20 DIAGNOSIS — N289 Disorder of kidney and ureter, unspecified: Secondary | ICD-10-CM

## 2014-02-20 DIAGNOSIS — I509 Heart failure, unspecified: Secondary | ICD-10-CM

## 2014-02-20 DIAGNOSIS — Z6841 Body Mass Index (BMI) 40.0 and over, adult: Secondary | ICD-10-CM

## 2014-02-20 DIAGNOSIS — A4150 Gram-negative sepsis, unspecified: Secondary | ICD-10-CM | POA: Diagnosis present

## 2014-02-20 DIAGNOSIS — Z91199 Patient's noncompliance with other medical treatment and regimen due to unspecified reason: Secondary | ICD-10-CM

## 2014-02-20 DIAGNOSIS — R001 Bradycardia, unspecified: Secondary | ICD-10-CM

## 2014-02-20 DIAGNOSIS — I959 Hypotension, unspecified: Secondary | ICD-10-CM

## 2014-02-20 DIAGNOSIS — F05 Delirium due to known physiological condition: Secondary | ICD-10-CM | POA: Diagnosis present

## 2014-02-20 DIAGNOSIS — I4891 Unspecified atrial fibrillation: Secondary | ICD-10-CM | POA: Diagnosis present

## 2014-02-20 DIAGNOSIS — I5032 Chronic diastolic (congestive) heart failure: Secondary | ICD-10-CM | POA: Diagnosis present

## 2014-02-20 DIAGNOSIS — R0902 Hypoxemia: Secondary | ICD-10-CM | POA: Diagnosis present

## 2014-02-20 DIAGNOSIS — R339 Retention of urine, unspecified: Secondary | ICD-10-CM

## 2014-02-20 DIAGNOSIS — G309 Alzheimer's disease, unspecified: Secondary | ICD-10-CM | POA: Diagnosis present

## 2014-02-20 DIAGNOSIS — E860 Dehydration: Secondary | ICD-10-CM | POA: Diagnosis present

## 2014-02-20 DIAGNOSIS — I10 Essential (primary) hypertension: Secondary | ICD-10-CM

## 2014-02-20 DIAGNOSIS — E871 Hypo-osmolality and hyponatremia: Secondary | ICD-10-CM | POA: Diagnosis present

## 2014-02-20 DIAGNOSIS — R1110 Vomiting, unspecified: Secondary | ICD-10-CM

## 2014-02-20 DIAGNOSIS — I639 Cerebral infarction, unspecified: Secondary | ICD-10-CM

## 2014-02-20 DIAGNOSIS — R319 Hematuria, unspecified: Secondary | ICD-10-CM

## 2014-02-20 DIAGNOSIS — R5383 Other fatigue: Secondary | ICD-10-CM

## 2014-02-20 DIAGNOSIS — E876 Hypokalemia: Secondary | ICD-10-CM | POA: Diagnosis present

## 2014-02-20 DIAGNOSIS — F0390 Unspecified dementia without behavioral disturbance: Secondary | ICD-10-CM | POA: Diagnosis present

## 2014-02-20 DIAGNOSIS — Z87891 Personal history of nicotine dependence: Secondary | ICD-10-CM

## 2014-02-20 DIAGNOSIS — Z7901 Long term (current) use of anticoagulants: Secondary | ICD-10-CM

## 2014-02-20 DIAGNOSIS — R531 Weakness: Secondary | ICD-10-CM

## 2014-02-20 DIAGNOSIS — B372 Candidiasis of skin and nail: Secondary | ICD-10-CM

## 2014-02-20 DIAGNOSIS — N390 Urinary tract infection, site not specified: Secondary | ICD-10-CM

## 2014-02-20 DIAGNOSIS — T83511A Infection and inflammatory reaction due to indwelling urethral catheter, initial encounter: Principal | ICD-10-CM | POA: Diagnosis present

## 2014-02-20 DIAGNOSIS — I5033 Acute on chronic diastolic (congestive) heart failure: Secondary | ICD-10-CM

## 2014-02-20 DIAGNOSIS — Z66 Do not resuscitate: Secondary | ICD-10-CM | POA: Diagnosis present

## 2014-02-20 DIAGNOSIS — N179 Acute kidney failure, unspecified: Secondary | ICD-10-CM | POA: Diagnosis present

## 2014-02-20 DIAGNOSIS — N400 Enlarged prostate without lower urinary tract symptoms: Secondary | ICD-10-CM | POA: Diagnosis present

## 2014-02-20 DIAGNOSIS — R6510 Systemic inflammatory response syndrome (SIRS) of non-infectious origin without acute organ dysfunction: Secondary | ICD-10-CM

## 2014-02-20 DIAGNOSIS — D72829 Elevated white blood cell count, unspecified: Secondary | ICD-10-CM | POA: Diagnosis present

## 2014-02-20 DIAGNOSIS — Y846 Urinary catheterization as the cause of abnormal reaction of the patient, or of later complication, without mention of misadventure at the time of the procedure: Secondary | ICD-10-CM | POA: Diagnosis present

## 2014-02-20 DIAGNOSIS — A419 Sepsis, unspecified organism: Secondary | ICD-10-CM

## 2014-02-20 DIAGNOSIS — Z87440 Personal history of urinary (tract) infections: Secondary | ICD-10-CM

## 2014-02-20 DIAGNOSIS — N12 Tubulo-interstitial nephritis, not specified as acute or chronic: Secondary | ICD-10-CM | POA: Diagnosis present

## 2014-02-20 DIAGNOSIS — Z7982 Long term (current) use of aspirin: Secondary | ICD-10-CM

## 2014-02-20 DIAGNOSIS — Z87898 Personal history of other specified conditions: Secondary | ICD-10-CM

## 2014-02-20 DIAGNOSIS — E669 Obesity, unspecified: Secondary | ICD-10-CM | POA: Diagnosis present

## 2014-02-20 DIAGNOSIS — Z9119 Patient's noncompliance with other medical treatment and regimen: Secondary | ICD-10-CM

## 2014-02-20 DIAGNOSIS — F0280 Dementia in other diseases classified elsewhere without behavioral disturbance: Secondary | ICD-10-CM | POA: Diagnosis present

## 2014-02-20 DIAGNOSIS — R5381 Other malaise: Secondary | ICD-10-CM

## 2014-02-20 HISTORY — DX: Unspecified dementia, unspecified severity, without behavioral disturbance, psychotic disturbance, mood disturbance, and anxiety: F03.90

## 2014-02-20 LAB — COMPREHENSIVE METABOLIC PANEL
ALBUMIN: 3.3 g/dL — AB (ref 3.5–5.2)
ALK PHOS: 58 U/L (ref 39–117)
ALT: 14 U/L (ref 0–53)
AST: 33 U/L (ref 0–37)
BUN: 21 mg/dL (ref 6–23)
CO2: 29 mEq/L (ref 19–32)
Calcium: 9.3 mg/dL (ref 8.4–10.5)
Chloride: 91 mEq/L — ABNORMAL LOW (ref 96–112)
Creatinine, Ser: 1.38 mg/dL — ABNORMAL HIGH (ref 0.50–1.35)
GFR calc Af Amer: 53 mL/min — ABNORMAL LOW (ref >90)
GFR calc non Af Amer: 45 mL/min — ABNORMAL LOW (ref >90)
Glucose, Bld: 169 mg/dL — ABNORMAL HIGH (ref 70–99)
POTASSIUM: 3 meq/L — AB (ref 3.7–5.3)
SODIUM: 134 meq/L — AB (ref 137–147)
TOTAL PROTEIN: 7.1 g/dL (ref 6.0–8.3)
Total Bilirubin: 1.7 mg/dL — ABNORMAL HIGH (ref 0.3–1.2)

## 2014-02-20 LAB — URINALYSIS, ROUTINE W REFLEX MICROSCOPIC
BILIRUBIN URINE: NEGATIVE
Glucose, UA: NEGATIVE mg/dL
KETONES UR: NEGATIVE mg/dL
Nitrite: POSITIVE — AB
PH: 6 (ref 5.0–8.0)
Protein, ur: 30 mg/dL — AB
Specific Gravity, Urine: 1.015 (ref 1.005–1.030)
Urobilinogen, UA: 0.2 mg/dL (ref 0.0–1.0)

## 2014-02-20 LAB — URINE MICROSCOPIC-ADD ON

## 2014-02-20 LAB — CBC WITH DIFFERENTIAL/PLATELET
BASOS PCT: 0 % (ref 0–1)
Basophils Absolute: 0 10*3/uL (ref 0.0–0.1)
EOS ABS: 0 10*3/uL (ref 0.0–0.7)
Eosinophils Relative: 0 % (ref 0–5)
HCT: 40.1 % (ref 39.0–52.0)
Hemoglobin: 13.7 g/dL (ref 13.0–17.0)
Lymphocytes Relative: 4 % — ABNORMAL LOW (ref 12–46)
Lymphs Abs: 0.8 10*3/uL (ref 0.7–4.0)
MCH: 29.9 pg (ref 26.0–34.0)
MCHC: 34.2 g/dL (ref 30.0–36.0)
MCV: 87.6 fL (ref 78.0–100.0)
Monocytes Absolute: 1.6 10*3/uL — ABNORMAL HIGH (ref 0.1–1.0)
Monocytes Relative: 8 % (ref 3–12)
NEUTROS PCT: 88 % — AB (ref 43–77)
Neutro Abs: 18 10*3/uL — ABNORMAL HIGH (ref 1.7–7.7)
PLATELETS: 203 10*3/uL (ref 150–400)
RBC: 4.58 MIL/uL (ref 4.22–5.81)
RDW: 13.6 % (ref 11.5–15.5)
WBC: 20.4 10*3/uL — ABNORMAL HIGH (ref 4.0–10.5)

## 2014-02-20 LAB — DIGOXIN LEVEL: Digoxin Level: 1.9 ng/mL (ref 0.8–2.0)

## 2014-02-20 LAB — LACTIC ACID, PLASMA: Lactic Acid, Venous: 1.4 mmol/L (ref 0.5–2.2)

## 2014-02-20 LAB — TROPONIN I: Troponin I: <0.3 ng/mL (ref <0.30)

## 2014-02-20 MED ORDER — PIPERACILLIN-TAZOBACTAM 3.375 G IVPB 30 MIN
3.3750 g | Freq: Once | INTRAVENOUS | Status: AC
  Administered 2014-02-20: 3.375 g via INTRAVENOUS
  Filled 2014-02-20 (×2): qty 50

## 2014-02-20 MED ORDER — DEXTROSE 5 % IV SOLN
1.0000 g | INTRAVENOUS | Status: DC
  Filled 2014-02-20: qty 10

## 2014-02-20 MED ORDER — SODIUM CHLORIDE 0.9 % IJ SOLN
3.0000 mL | Freq: Two times a day (BID) | INTRAMUSCULAR | Status: DC
  Administered 2014-02-20 – 2014-02-21 (×4): 3 mL via INTRAVENOUS

## 2014-02-20 MED ORDER — PIPERACILLIN-TAZOBACTAM 3.375 G IVPB
3.3750 g | Freq: Three times a day (TID) | INTRAVENOUS | Status: DC
  Administered 2014-02-20 – 2014-02-22 (×5): 3.375 g via INTRAVENOUS
  Filled 2014-02-20 (×9): qty 50

## 2014-02-20 MED ORDER — SENNOSIDES-DOCUSATE SODIUM 8.6-50 MG PO TABS: 1.0000 | ORAL_TABLET | Freq: Every day | ORAL | Status: DC | PRN

## 2014-02-20 MED ORDER — ONDANSETRON HCL 4 MG/2ML IJ SOLN: 4.0000 mg | Freq: Four times a day (QID) | INTRAMUSCULAR | Status: DC | PRN

## 2014-02-20 MED ORDER — SODIUM CHLORIDE 0.9 % IV SOLN
INTRAVENOUS | Status: AC
  Administered 2014-02-20: 16:00:00 via INTRAVENOUS

## 2014-02-20 MED ORDER — VANCOMYCIN HCL IN DEXTROSE 1-5 GM/200ML-% IV SOLN
1000.0000 mg | Freq: Once | INTRAVENOUS | Status: AC
  Administered 2014-02-20: 1000 mg via INTRAVENOUS
  Filled 2014-02-20: qty 200

## 2014-02-20 MED ORDER — IPRATROPIUM-ALBUTEROL 0.5-2.5 (3) MG/3ML IN SOLN
3.0000 mL | Freq: Four times a day (QID) | RESPIRATORY_TRACT | Status: DC
  Administered 2014-02-20: 3 mL via RESPIRATORY_TRACT
  Filled 2014-02-20: qty 3

## 2014-02-20 MED ORDER — ASPIRIN EC 81 MG PO TBEC
81.0000 mg | DELAYED_RELEASE_TABLET | Freq: Every day | ORAL | Status: DC
  Administered 2014-02-21 – 2014-02-22 (×2): 81 mg via ORAL
  Filled 2014-02-20 (×2): qty 1

## 2014-02-20 MED ORDER — BUDESONIDE-FORMOTEROL FUMARATE 80-4.5 MCG/ACT IN AERO
2.0000 | INHALATION_SPRAY | Freq: Two times a day (BID) | RESPIRATORY_TRACT | Status: DC | PRN
  Filled 2014-02-20: qty 6.9

## 2014-02-20 MED ORDER — SIMVASTATIN 20 MG PO TABS
20.0000 mg | ORAL_TABLET | Freq: Every evening | ORAL | Status: DC
  Administered 2014-02-20 – 2014-02-21 (×2): 20 mg via ORAL
  Filled 2014-02-20 (×2): qty 1

## 2014-02-20 MED ORDER — SODIUM CHLORIDE 0.9 % IV BOLUS (SEPSIS)
500.0000 mL | Freq: Once | INTRAVENOUS | Status: AC
  Administered 2014-02-20: 500 mL via INTRAVENOUS

## 2014-02-20 MED ORDER — ALUM & MAG HYDROXIDE-SIMETH 200-200-20 MG/5ML PO SUSP: 30.0000 mL | Freq: Four times a day (QID) | ORAL | Status: DC | PRN

## 2014-02-20 MED ORDER — OXYCODONE-ACETAMINOPHEN 5-325 MG PO TABS: 1.0000 | ORAL_TABLET | ORAL | Status: DC | PRN

## 2014-02-20 MED ORDER — ACETAMINOPHEN 325 MG PO TABS
650.0000 mg | ORAL_TABLET | Freq: Four times a day (QID) | ORAL | Status: DC | PRN
  Administered 2014-02-20: 650 mg via ORAL
  Filled 2014-02-20: qty 2

## 2014-02-20 MED ORDER — FINASTERIDE 5 MG PO TABS
5.0000 mg | ORAL_TABLET | Freq: Every day | ORAL | Status: DC
  Administered 2014-02-20 – 2014-02-22 (×3): 5 mg via ORAL
  Filled 2014-02-20 (×4): qty 1

## 2014-02-20 MED ORDER — IPRATROPIUM BROMIDE 0.02 % IN SOLN: 0.5000 mg | Freq: Four times a day (QID) | RESPIRATORY_TRACT | Status: DC

## 2014-02-20 MED ORDER — BISACODYL 10 MG RE SUPP: 10.0000 mg | Freq: Every day | RECTAL | Status: DC | PRN

## 2014-02-20 MED ORDER — NYSTATIN 100000 UNIT/GM EX CREA
1.0000 "application " | TOPICAL_CREAM | Freq: Two times a day (BID) | CUTANEOUS | Status: DC
  Administered 2014-02-20 – 2014-02-22 (×4): 1 via TOPICAL
  Filled 2014-02-20: qty 15

## 2014-02-20 MED ORDER — ONDANSETRON HCL 4 MG PO TABS: 4.0000 mg | ORAL_TABLET | Freq: Four times a day (QID) | ORAL | Status: DC | PRN

## 2014-02-20 MED ORDER — ENOXAPARIN SODIUM 40 MG/0.4ML ~~LOC~~ SOLN
40.0000 mg | SUBCUTANEOUS | Status: DC
  Administered 2014-02-20 – 2014-02-21 (×2): 40 mg via SUBCUTANEOUS
  Filled 2014-02-20 (×2): qty 0.4

## 2014-02-20 MED ORDER — POTASSIUM CHLORIDE CRYS ER 20 MEQ PO TBCR
40.0000 meq | EXTENDED_RELEASE_TABLET | ORAL | Status: AC
  Administered 2014-02-20 (×2): 40 meq via ORAL
  Filled 2014-02-20 (×2): qty 2

## 2014-02-20 MED ORDER — DOCUSATE SODIUM 100 MG PO CAPS
100.0000 mg | ORAL_CAPSULE | Freq: Two times a day (BID) | ORAL | Status: DC
  Administered 2014-02-20 – 2014-02-22 (×4): 100 mg via ORAL
  Filled 2014-02-20 (×4): qty 1

## 2014-02-20 MED ORDER — ACETAMINOPHEN 650 MG RE SUPP: 650.0000 mg | Freq: Four times a day (QID) | RECTAL | Status: DC | PRN

## 2014-02-20 MED ORDER — GUAIFENESIN-CODEINE 100-10 MG/5ML PO SOLN: 5.0000 mL | Freq: Four times a day (QID) | ORAL | Status: DC | PRN

## 2014-02-20 MED ORDER — ALBUTEROL SULFATE (2.5 MG/3ML) 0.083% IN NEBU: 2.5000 mg | INHALATION_SOLUTION | Freq: Four times a day (QID) | RESPIRATORY_TRACT | Status: DC

## 2014-02-20 MED ORDER — ONDANSETRON HCL 4 MG/2ML IJ SOLN
4.0000 mg | Freq: Once | INTRAMUSCULAR | Status: AC
  Administered 2014-02-20: 4 mg via INTRAVENOUS
  Filled 2014-02-20: qty 2

## 2014-02-20 MED ORDER — IPRATROPIUM-ALBUTEROL 0.5-2.5 (3) MG/3ML IN SOLN
3.0000 mL | Freq: Three times a day (TID) | RESPIRATORY_TRACT | Status: DC
Start: 2014-02-20 — End: 2014-02-22
  Administered 2014-02-20 – 2014-02-22 (×4): 3 mL via RESPIRATORY_TRACT
  Filled 2014-02-20 (×5): qty 3

## 2014-02-20 NOTE — H&P (Signed)
[facility] History and Physical  Vincent BeathRobert W Espinal WJX:[id number] DOB: 1928/09/18 DOA: 02/20/2014  Referring physician:  PCP: Samuel JesterBUTLER, CYNTHIA, DO   Chief Complaint: fall emesis  HPI: Vincent [name] is a 80 y.o. male with past medical history that includes A. fib currently on anticoagulation, chronic diastolic heart failure, recurrent urinary tract infection do to indwelling catheter, urinary retention presents to the emergency department via EMS from home with the chief complaint of fatigue and emesis. Patient is a very poor historian do to what appears to be underlying dementia. He believes he remembers coming via ambulance. Reports indicate that his landlord who is also his caregiver and healthcare POA found him on the floor this morning and called EMS. Patient denies any recent illness or current pain. He reports some intermittent nausea but denies any vomiting. Denies abdominal pain diarrhea. He states he's been eating or drink and drinking his normal amount. Obvious that information obtained from patient is unreliable do to acute confusion in the setting of dementia. Initial workup in the emergency room he is found to be hypotensive with leukocytosis and urinalysis consistent with UTI. Basic metabolic panel with elevated creatinine and electrolytes consistent with dehydration. Chest x-ray without infiltrate. He was given 2 500 ml boluses of normal saline a dose of vancomycin and a dose of Zosyn as well as Zofran. At the time of my exam he is hemodynamically stable afebrile mildly hypoxic with oxygen saturations of 92% on 3 L. He is alert and oriented to self and place.  Review of Systems:  10 point review of systems completed all systems are negative except as indicated in the history of present illness noting that information from patient may be unreliable do to underlying dementia  Past Medical History  Diagnosis Date  . Urine retention   . Atrial fibrillation   . Lumbar  spondylosis   . Prostate hypertrophy   . Arachnoid cyst   . Hypertension   . CHF (congestive heart failure)   . UTI (lower urinary tract infection)   . Sepsis    Past Surgical History  Procedure Laterality Date  . Suprapubic catheter placement    . Transurethral resection of prostate    . Renal abscess drainage     Social History:  reports that he has quit smoking. He has never used smokeless tobacco. He reports that he does not drink alcohol or use illicit drugs. He lives alone that his landlord and caregiver lives nearby. No Known Allergies  No family history on file.   Prior to Admission medications   Medication Sig Start Date End Date Taking? Authorizing Provider  albuterol (PROVENTIL HFA;VENTOLIN HFA) 108 (90 BASE) MCG/ACT inhaler Inhale 2 puffs into the lungs every 6 (six) hours as needed. For asthma    Historical Provider, MD  amLODipine (NORVASC) 10 MG tablet Take 5 mg by mouth daily.     Historical Provider, MD  aspirin 81 MG tablet Take 81 mg by mouth daily.    Historical Provider, MD  azithromycin (ZITHROMAX Z-PAK) 250 MG tablet 2 po day one, then 1 daily x 4 days 01/02/14   Geoffery Lyonsouglas Delo, MD  budesonide-formoterol [facility](SYMBICORT) 80-4.5 MCG/ACT inhaler Inhale 2 puffs into the lungs 2 (two) times daily as needed. Shortness of Breath     Historical Provider, MD  ciprofloxacin (CIPRO) 500 MG tablet Take 1 tablet (500 mg total) by mouth 2 (two) times daily. 10/10/13   Renae FickleMackenzie Short, MD  finasteride (PROSCAR) 5 MG tablet Take 5 mg by  mouth daily.    Historical Provider, MD  furosemide (LASIX) 40 MG tablet Take 1 tablet (40 mg total) by mouth daily. 10/10/13   Renae Fickle, MD  guaiFENesin-codeine 100-10 MG/5ML syrup Take 5 mLs by mouth every 6 (six) hours as needed for cough. 01/02/14   Geoffery Lyons, MD  nystatin cream (MYCOSTATIN) Apply 1 application topically 2 (two) times daily. Apply to abdominal rash 10/10/13   Renae Fickle, MD  oxyCODONE-acetaminophen (PERCOCET/ROXICET)  5-325 MG per tablet Take 1 tablet by mouth every 4 (four) hours as needed. Pain    Historical Provider, MD  potassium chloride 20 MEQ TBCR Take 20 mEq by mouth daily. 10/10/13   Renae Fickle, MD  sennosides-docusate sodium (SENOKOT-S) 8.6-50 MG tablet Take 1 tablet by mouth daily as needed for constipation.    Historical Provider, MD  simvastatin (ZOCOR) 20 MG tablet Take 1 tablet (20 mg total) by mouth every evening. 07/29/13   Erick Blinks, MD  tamsulosin (FLOMAX) 0.4 MG CAPS Take 0.4 mg by mouth daily.    Historical Provider, MD   Physical Exam: Filed Vitals:   02/20/14 1424  BP: 117/52  Pulse: 66  Temp: 98.8 F (37.1 C)  Resp: 22    BP 117/52  Pulse 66  Temp(Src) 98.8 F (37.1 C) (Oral)  Resp 22  Ht 5\' 6"  (1.676 m)  Wt 117.935 kg (260 lb)  BMI 41.99 kg/m2  SpO2 100%  General:  Appears calm and comfortable Eyes: PERRL, normal lids, irises & conjunctiva ENT: grossly normal hearing, lips & tongue Neck: no LAD, masses or thyromegaly Cardiovascular: RRR, no m/r/g. No LE edema. Telemetry: SR, no arrhythmias  Respiratory: Normal effort. Breath sounds with diffuse wheezing no rhonchi there Abdomen: soft, ntnd is a bowel sounds throughout no mass organomegaly no guarding Skin: no rash or induration seen on limited exam Musculoskeletal: grossly normal tone BUE/BLE Psychiatric: grossly normal mood and affect, speech fluent and appropriate Neurologic: grossly non-focal. Follows commands           Labs on Admission:  Basic Metabolic Panel:  Recent Labs Lab 02/20/14 0848  NA 134*  K 3.0*  CL 91*  CO2 29  GLUCOSE 169*  BUN 21  CREATININE 1.38*  CALCIUM 9.3   Liver Function Tests:  Recent Labs Lab 02/20/14 0848  AST 33  ALT 14  ALKPHOS 58  BILITOT 1.7*  PROT 7.1  ALBUMIN 3.3*   No results found for this basename: LIPASE, AMYLASE,  in the last 168 hours No results found for this basename: AMMONIA,  in the last 168 hours CBC:  Recent Labs Lab  02/20/14 0848  WBC 20.4*  NEUTROABS 18.0*  HGB 13.7  HCT 40.1  MCV 87.6  PLT 203   Cardiac Enzymes:  Recent Labs Lab 02/20/14 0848  TROPONINI <0.30    BNP (last 3 results)  Recent Labs  04/17/13 2349 10/09/13 1913  PROBNP 3622.0* 2082.0*   CBG: No results found for this basename: GLUCAP,  in the last 168 hours  Radiological Exams on Admission: No results found.  EKG: Independently reviewed yields A. fib with no acute  Assessment/Plan Principal Problem:   Sepsis: Likely related to urinary tract infection. Patient with leukocytosis and hypotension. Responded positively to fluid resuscitation. At the time of my exam hemodynamically stable. Will admit to telemetry. Will obtain urine culture and blood culture. Given one dose of vancomycin one dose of Zosyn in the emergency department. Will continue zosyn  per pharmacy. Patient on cipro at home daily.  Await cultures for further adjustment.  Problems: Catheter-associated urinary tract infection due to indwelling suprapubic catheter: History of same. Will await urine culture. Caregiver indicates tube change the last week. Patient has had an indwelling suprapubic catheter for several years due to urinary retention. And is on Cipro prophylactically. Zosyn per pharmacy  Acute renal failure: Likely related to dehydration from decreased by mouth intake. SpO2 caregivers who indicated patient has not been eating and drinking his normal amount over the last 2 days. Will hold any nephrotoxins. Will monitor urine output. Will recheck in the a.m. If no improvement consider renal ultrasound.    HYPERTENSION: Patient was hypotensive in the emergency department. Responded to fluid resuscitation. Home medications include amlodipine Lasix will hold these for now. Will monitor closely and resume when indicated    Leukocytosis: Related to urinary tract infection. Antibiotics as above. Currently patient is afebrile and nontoxic appearing. Will  monitor close  Afib: was on dig in past but had issues with bradycardia. Has refused coumadin in past but agreed to xarelto in September. Not currently on at home. Unclear why. Currently rate controlled.   CHF (congestive heart failure): Patient with history of chronic diastolic failure. Medications include Lasix which we'll hold for now. Chart review indicates recent EF documented at 65%. Will monitor intake and output and daily weights. Continue IV fluids judiciously.   Dehydration: due to decreased po intake. See above    Hyponatremia: related to above. IV fluids. Recheck in am    Hypokalemia: related to above. Will replete and recheck    OBESITY: BMI 42.1    BENIGN PROSTATIC HYPERTROPHY, HX OF: continue home med         Code Status: DNR Family Communication: theresa reddy neighbor and HCPOA on phone Disposition Plan: home when ready  Time spent: 60 minutes  [facility] Pager [phone]

## 2014-02-20 NOTE — Progress Notes (Signed)
Nutrition Brief Note  Patient identified on the Malnutrition Screening Tool (MST) Report  Wt Readings from Last 15 Encounters:  02/20/14 260 lb (117.935 kg)  01/02/14 260 lb (117.935 kg)  10/09/13 218 lb 6.4 oz (99.066 kg)  07/28/13 200 lb (90.719 kg)  04/19/13 224 lb 6.9 oz (101.8 kg)  09/22/12 260 lb (117.935 kg)  07/12/12 260 lb (117.935 kg)  04/25/12 260 lb (117.935 kg)  04/07/12 260 lb (117.935 kg)  04/05/12 260 lb (117.935 kg)  11/15/11 260 lb (117.935 kg)  11/13/11 260 lb (117.935 kg)  10/13/11 260 lb (117.935 kg)  10/12/11 260 lb (117.935 kg)  08/14/11 260 lb (117.935 kg)    Body mass index is 41.99 kg/(m^2). Patient meets criteria for obesity, class III based on current BMI.   Current diet order is Heart Healthy, patient is consuming approximately n/a% of meals at this time. Labs and medications reviewed.   No nutrition interventions warranted at this time. If nutrition issues arise, please consult RD.   Demaryius Imran A. Mayford KnifeWilliams, RD, LDN Pager: [phone]

## 2014-02-20 NOTE — ED Notes (Signed)
Report given to receiving RN. Pt ready for transport. 

## 2014-02-20 NOTE — ED Notes (Signed)
Landlord stated pt was so weak that he could not walk this morning. Landlord also reported vomiting per EMS. PT lives alone and is in early stages of alzheimer's.

## 2014-02-20 NOTE — ED Notes (Signed)
Denies chest pain, shortness of breath. States he "hasn't been feeling good" when asked if he has been feeling sick. At baseline mental status.

## 2014-02-20 NOTE — ED Provider Notes (Signed)
CSN: [id number]     Arrival date & time 02/20/14  4098 History  This chart was scribed for Ward Givens, MD by Danella Maiers, ED Scribe. This patient was seen in room [facility]12/[facility]12 and the patient's care was started at 8:52 AM.    Chief Complaint  Patient presents with  . Fatigue  . Emesis   Level 5 Caveat - Alzheimer's   (Consider location/radiation/quality/duration/timing/severity/associated sxs/prior Treatment) The history is provided by the patient. The history is limited by the condition of the patient. No language interpreter was used.   HPI Comments: Vincent [name] is a 80 y.o. male who presents to the Emergency Department complaining of vomiting and weakness. When asked patient states he does not know why he is here. He does not recall being brought in by ambulance. Pt denies nausea, diarrhea, abdominal pain or pain anywhere else. States he has been eating fine. Per nursing note, pt's landlord called the ambulance this morning when he came into the pt's home where he lives alone and saw the pt was vomiting, weak, and unable to walk.   PCP - Samuel Jester  Past Medical History  Diagnosis Date  . Urine retention   . Atrial fibrillation   . Lumbar spondylosis   . Prostate hypertrophy   . Arachnoid cyst   . Hypertension   . CHF (congestive heart failure)   . UTI (lower urinary tract infection)   . Sepsis   . Dementia   . Urine retention    Past Surgical History  Procedure Laterality Date  . Suprapubic catheter placement    . Transurethral resection of prostate    . Renal abscess drainage     Family History  Problem Relation Age of Onset  . Family history unknown: Yes   History  Substance Use Topics  . Smoking status: Former Smoker    Types: Cigarettes  . Smokeless tobacco: Never Used  . Alcohol Use: No  lives at home Lives alone  Review of Systems  Unable to perform ROS: Dementia  Gastrointestinal: Positive for vomiting.  Neurological: Positive for  weakness.      Allergies  Review of patient's allergies indicates no known allergies.  Home Medications   No current outpatient prescriptions on file.   ED Triage Vitals  Enc Vitals Group     BP 02/20/14 0850 103/51 mmHg     Pulse Rate 02/20/14 0850 75     Resp 02/20/14 0850 20     Temp 02/20/14 0850 98.8 F (37.1 C)     Temp src 02/20/14 0850 Oral     SpO2 02/20/14 0850 91 %     Weight 02/20/14 0850 260 lb (117.935 kg)     Height 02/20/14 0850 5\' 6"  (1.676 m)     Head Cir --      Peak Flow --      Pain Score --      Pain Loc --      Pain Edu? --      Excl. in GC? --    Vital signs normal, but did have some hypotension down to 92/40 and 88/52 during his ED course and with orthostatic vital signs.  Physical Exam  Nursing note and vitals reviewed. Constitutional: He appears well-developed and well-nourished.  Non-toxic appearance. He does not appear ill. No distress.  HENT:  Head: Normocephalic and atraumatic.  Right Ear: External ear normal.  Left Ear: External ear normal.  Nose: Nose normal. No mucosal edema or rhinorrhea.  Mouth/Throat:  Mucous membranes are normal. No dental abscesses or uvula swelling.  Tongue dry  Eyes: Conjunctivae and EOM are normal. Pupils are equal, round, and reactive to light.  Neck: Normal range of motion and full passive range of motion without pain. Neck supple.  Cardiovascular: Normal rate, regular rhythm and normal heart sounds.  Exam reveals no gallop and no friction rub.   No murmur heard. Pulmonary/Chest: Effort normal and breath sounds normal. No respiratory distress. He has no wheezes. He has no rhonchi. He has no rales. He exhibits no tenderness and no crepitus.  Abdominal: Soft. Normal appearance and bowel sounds are normal. He exhibits no distension. There is no tenderness. There is no rebound and no guarding.  Musculoskeletal: Normal range of motion. He exhibits no edema and no tenderness.  Moves all extremities well.    Neurological: He is alert. He has normal strength. No cranial nerve deficit.  Pt does not know the month or year, he does know [facility]  Skin: Skin is warm, dry and intact. No rash noted. No erythema. No pallor.  Psychiatric: His speech is normal and behavior is normal. His affect is blunt.    ED Course  Procedures (including critical care time) Medications  piperacillin-tazobactam (ZOSYN) IVPB 3.375 g (not administered)  ondansetron (ZOFRAN) injection 4 mg (4 mg Intravenous Given 02/20/14 0926)  sodium chloride 0.9 % bolus 500 mL (0 mLs Intravenous Stopped 02/20/14 1030)  sodium chloride 0.9 % bolus 500 mL (0 mLs Intravenous Stopped 02/20/14 1130)  vancomycin (VANCOCIN) IVPB 1000 mg/200 mL premix (0 mg Intravenous Stopped 02/20/14 1305)  piperacillin-tazobactam (ZOSYN) IVPB 3.375 g (0 g Intravenous Stopped 02/20/14 1200)    DIAGNOSTIC STUDIES: Oxygen Saturation is 95% on [place], normal by my interpretation.    COORDINATION OF CARE: 9:08 AM- Discussed treatment plan with pt which includes IV fluids, blood work. Pt agrees to plan.  Patient given IV fluids slowly because of his age. He was started on IV antibiotics for presumed UTI. Patient has history of urosepsis in June 2014 and was admitted in November for UTI. Review of his labs shows his last urine culture was November 24 which showed more than 100,000 colonies of multiple BCCs. His last positive urine culture was November 2013 when he had over 100,000 colonies of Proteus mirabilis.  11:42 Dr Malachi Bonds admit to tele, team 2  Labs Review Results for orders placed during the hospital encounter of 02/20/14  CBC WITH DIFFERENTIAL      Result Value Ref Range   WBC 20.4 (*) 4.0 - 10.5 K/uL   RBC 4.58  4.22 - 5.81 MIL/uL   Hemoglobin 13.7  13.0 - 17.0 g/dL   HCT 13.2  44.0 - 10.2 %   MCV 87.6  78.0 - 100.0 fL   MCH 29.9  26.0 - 34.0 pg   MCHC 34.2  30.0 - 36.0 g/dL   RDW 72.5  36.6 - 44.0 %   Platelets 203  150 - 400 K/uL    Neutrophils Relative % 88 (*) 43 - 77 %   Neutro Abs 18.0 (*) 1.7 - 7.7 K/uL   Lymphocytes Relative 4 (*) 12 - 46 %   Lymphs Abs 0.8  0.7 - 4.0 K/uL   Monocytes Relative 8  3 - 12 %   Monocytes Absolute 1.6 (*) 0.1 - 1.0 K/uL   Eosinophils Relative 0  0 - 5 %   Eosinophils Absolute 0.0  0.0 - 0.7 K/uL   Basophils Relative 0  0 - 1 %   Basophils Absolute 0.0  0.0 - 0.1 K/uL  COMPREHENSIVE METABOLIC PANEL      Result Value Ref Range   Sodium 134 (*) 137 - 147 mEq/L   Potassium 3.0 (*) 3.7 - 5.3 mEq/L   Chloride 91 (*) 96 - 112 mEq/L   CO2 29  19 - 32 mEq/L   Glucose, Bld 169 (*) 70 - 99 mg/dL   BUN 21  6 - 23 mg/dL   Creatinine, Ser 7.82 (*) 0.50 - 1.35 mg/dL   Calcium 9.3  8.4 - 95.6 mg/dL   Total Protein 7.1  6.0 - 8.3 g/dL   Albumin 3.3 (*) 3.5 - 5.2 g/dL   AST 33  0 - 37 U/L   ALT 14  0 - 53 U/L   Alkaline Phosphatase 58  39 - 117 U/L   Total Bilirubin 1.7 (*) 0.3 - 1.2 mg/dL   GFR calc non Af Amer 45 (*) >90 mL/min   GFR calc Af Amer 53 (*) >90 mL/min  URINALYSIS, ROUTINE W REFLEX MICROSCOPIC      Result Value Ref Range   Color, Urine YELLOW  YELLOW   APPearance CLEAR  CLEAR   Specific Gravity, Urine 1.015  1.005 - 1.030   pH 6.0  5.0 - 8.0   Glucose, UA NEGATIVE  NEGATIVE mg/dL   Hgb urine dipstick LARGE (*) NEGATIVE   Bilirubin Urine NEGATIVE  NEGATIVE   Ketones, ur NEGATIVE  NEGATIVE mg/dL   Protein, ur 30 (*) NEGATIVE mg/dL   Urobilinogen, UA 0.2  0.0 - 1.0 mg/dL   Nitrite POSITIVE (*) NEGATIVE   Leukocytes, UA MODERATE (*) NEGATIVE  DIGOXIN LEVEL      Result Value Ref Range   Digoxin Level 1.9  0.8 - 2.0 ng/mL  TROPONIN I      Result Value Ref Range   Troponin I <0.30  <0.30 ng/mL  URINE MICROSCOPIC-ADD ON      Result Value Ref Range   WBC, UA TOO NUMEROUS TO COUNT  <3 WBC/hpf   RBC / HPF TOO NUMEROUS TO COUNT  <3 RBC/hpf   Bacteria, UA MANY (*) RARE  LACTIC ACID, PLASMA      Result Value Ref Range   Lactic Acid, Venous 1.4  0.5 - 2.2 mmol/L    Laboratory interpretation all normal except UTI, renal insuffic, leukocytosis    Imaging Review No results found.   EKG Interpretation   Date/Time:  Tuesday February 20 2014 09:23:19 EDT Ventricular Rate:  68 PR Interval:    QRS Duration: 96 QT Interval:  362 QTC Calculation: 384 R Axis:   61 Text Interpretation:  Atrial fibrillation ST \\T \ T wave abnormality,  consider inferior ischemia ST \\T \ T wave abnormality, consider  anterolateral ischemia When compared with ECG of 02-Jan-2014 09:44, No  significant change was found Confirmed by Jalina Blowers  MD-I, Kei Mcelhiney ([id number]) on  02/20/2014 9:34:06 AM      MDM   patient with dementia presents with weakness noted by his phlegm or. Patient has history of urosepsis and has a chronic indwelling urinary Foley catheter. He appears to have another UTI today. He also had some hypotension today treated with IV fluids. He is being admitted for treatment of his UTI and hopefully prevent him from developing urosepsis again. Patient also has been vomiting and appears to be dehydrated. He was given IV fluids also for his dehydration.    Final diagnoses:  Vomiting  Weakness  Hypotension  Renal  insufficiency  Hypokalemia  UTI (urinary tract infection)    Plan admission  Devoria AlbeIva Kashvi Prevette, MD, FACEP  CRITICAL CARE Performed by: Devoria AlbeKNAPP,Carlester Kasparek L Total critical care time: 32 min Critical care time was exclusive of separately billable procedures and treating other patients. Critical care was necessary to treat or prevent imminent or life-threatening deterioration. Critical care was time spent personally by me on the following activities: development of treatment plan with patient and/or surrogate as well as nursing, discussions with consultants, evaluation of patient's response to treatment, examination of patient, obtaining history from patient or surrogate, ordering and performing treatments and interventions, ordering and review of laboratory studies, ordering and  review of radiographic studies, pulse oximetry and re-evaluation of patient's condition.    I personally performed the services described in this documentation, which was scribed in my presence. The recorded information has been reviewed and considered.  Devoria AlbeIva Evelen Vazguez, MD, Armando GangFACEP   Ward GivensIva L Cathey [place], MD 02/20/14 [phone]

## 2014-02-20 NOTE — Progress Notes (Addendum)
ANTIBIOTIC CONSULT NOTE - INITIAL  Pharmacy Consult for Zosyn (changed from Rocephin) Indication: UTI  No Known Allergies  Patient Measurements: Height: 5\' 6"  (167.6 cm) Weight: 260 lb (117.935 kg) IBW/kg (Calculated) : 63.8  Vital Signs: Temp: 98.8 F (37.1 C) (04/07 1424) Temp src: Oral (04/07 1424) BP: 117/52 mmHg (04/07 1424) Pulse Rate: 66 (04/07 1424) Intake/Output from previous day:   Intake/Output from this shift: Total I/O In: -  Out: 550 [Urine:550]  Labs:  Recent Labs  02/20/14 0848  WBC 20.4*  HGB 13.7  PLT 203  CREATININE 1.38*   Estimated Creatinine Clearance: 48.1 ml/min (by C-G formula based on Cr of 1.38). No results found for this basename: VANCOTROUGH, VANCOPEAK, VANCORANDOM, GENTTROUGH, GENTPEAK, GENTRANDOM, TOBRATROUGH, TOBRAPEAK, TOBRARND, AMIKACINPEAK, AMIKACINTROU, AMIKACIN,  in the last 72 hours   Microbiology: No results found for this or any previous visit (from the past 720 hour(s)).  Medical History: Past Medical History  Diagnosis Date  . Urine retention   . Atrial fibrillation   . Lumbar spondylosis   . Prostate hypertrophy   . Arachnoid cyst   . Hypertension   . CHF (congestive heart failure)   . UTI (lower urinary tract infection)   . Sepsis    Assessment: 80yo male with recurrent UTI due to indwelling catheter.  Pt is obese.  Estimated Creatinine Clearance: 48.1 ml/min (by C-G formula based on Cr of 1.38). Asked to initiate Zosyn for UTI (Rocephin d/c'd)  Goal of Therapy:  Eradicate infection.  Plan:  Zosyn 3.375gm IV q8h, each dose over 4 hrs. Monitor labs, renal fxn, and cultures  Valrie HartHall, Eliazer Hemphill A 02/20/2014,2:39 PM

## 2014-02-20 NOTE — H&P (Signed)
Patient seen and examined. Assessment and plan discussed with nurse practitioner Kirtland BouchardK. Black and agree with above.  History of present illness: Patient is an 80 year old male with past medical history of atrial fibrillation noncompliant with anticoagulation, chronic diastolic heart failure, recurrent urinary tract infections due to indwelling Foley catheter, who presents to the emergency department with falls, increased weakness, and nausea and vomiting. He states he generally feels unwell. He denies any respiratory complaints, diarrhea.  In the emergency department, his labs were suggestive of sepsis due to pyelonephritis, with hypotension and elevated white blood cell count.  He was given a dose of vancomycin and zosyn.    Exam:   Vital signs temperature 98.8, pulse 66, respirations 22, blood pressure currently 117/52, 100% on 1 L nasal cannula General: Caucasian male, no acute distress HEENT: Normocephalic atraumatic, some blood blisters on his lower lip, moist tongue CV: Regular rate and rhythm, 3/6 systolic murmur at the left and right sternal borders, 2+ pulses Pulm:  Clear to auscultation bilaterally, no increased work of breathing Abdomen: Normal active bowel sounds, soft, nondistended, and nontender MSK: No lower extremity edema GU: Foley bag was recently exchanged and is currently completely empty  Labs: White blood cell count of 20.4, mild hyponatremia, hypokalemia, hypochloremia, mild AKI  Assessment and plan Hypertension, mild and may be related to sepsis from pyelonephritis versus dehydration. -  Continue telemetry -  Continue IV fluids and antibiotics -  Hold Lasix and Norvasc  Pyelonephritis with possible sepsis secondary to indwelling urinary catheter, present at admission -  Patient on prophylactic ciprofloxacin at home -  Escalate to Zosyn -  Okay to discontinue vancomycin as UTI felt to be most likely cause of his illness -  Followup urine culture  Acute renal failure  with hyponatremia, hypokalemia, hypochloremia due to dehydration -  IV fluids and IV and oral potassium repletion as needed  Atrial fibrillation with bradycardia previously on digoxin. He has been noncompliant with Coumadin and xarelto. Consider aspirin for stroke prevention even though his Chads2vasc score is elevated.

## 2014-02-20 NOTE — Progress Notes (Signed)
UR chart review completed.  

## 2014-02-21 ENCOUNTER — Inpatient Hospital Stay (HOSPITAL_COMMUNITY): Payer: Medicare Other

## 2014-02-21 DIAGNOSIS — I498 Other specified cardiac arrhythmias: Secondary | ICD-10-CM

## 2014-02-21 DIAGNOSIS — N179 Acute kidney failure, unspecified: Secondary | ICD-10-CM

## 2014-02-21 DIAGNOSIS — E860 Dehydration: Secondary | ICD-10-CM

## 2014-02-21 DIAGNOSIS — T83511A Infection and inflammatory reaction due to indwelling urethral catheter, initial encounter: Principal | ICD-10-CM

## 2014-02-21 LAB — BASIC METABOLIC PANEL
BUN: 22 mg/dL (ref 6–23)
CALCIUM: 8.5 mg/dL (ref 8.4–10.5)
CO2: 30 mEq/L (ref 19–32)
CREATININE: 1.6 mg/dL — AB (ref 0.50–1.35)
Chloride: 93 mEq/L — ABNORMAL LOW (ref 96–112)
GFR calc Af Amer: 44 mL/min — ABNORMAL LOW (ref >90)
GFR, EST NON AFRICAN AMERICAN: 38 mL/min — AB (ref >90)
GLUCOSE: 160 mg/dL — AB (ref 70–99)
POTASSIUM: 3.8 meq/L (ref 3.7–5.3)
Sodium: 135 mEq/L — ABNORMAL LOW (ref 137–147)

## 2014-02-21 LAB — URINALYSIS, ROUTINE W REFLEX MICROSCOPIC
BILIRUBIN URINE: NEGATIVE
GLUCOSE, UA: NEGATIVE mg/dL
Ketones, ur: NEGATIVE mg/dL
NITRITE: NEGATIVE
PH: 6 (ref 5.0–8.0)
Protein, ur: 30 mg/dL — AB
SPECIFIC GRAVITY, URINE: 1.015 (ref 1.005–1.030)
Urobilinogen, UA: 0.2 mg/dL (ref 0.0–1.0)

## 2014-02-21 LAB — GLUCOSE, CAPILLARY: Glucose-Capillary: 152 mg/dL — ABNORMAL HIGH (ref 70–99)

## 2014-02-21 LAB — SODIUM, URINE, RANDOM: Sodium, Ur: <20 mEq/L

## 2014-02-21 LAB — URINE MICROSCOPIC-ADD ON

## 2014-02-21 LAB — CBC
HEMATOCRIT: 39.8 % (ref 39.0–52.0)
HEMOGLOBIN: 13.3 g/dL (ref 13.0–17.0)
MCH: 30 pg (ref 26.0–34.0)
MCHC: 33.4 g/dL (ref 30.0–36.0)
MCV: 89.6 fL (ref 78.0–100.0)
Platelets: 164 10*3/uL (ref 150–400)
RBC: 4.44 MIL/uL (ref 4.22–5.81)
RDW: 13.8 % (ref 11.5–15.5)
WBC: 15.4 10*3/uL — ABNORMAL HIGH (ref 4.0–10.5)

## 2014-02-21 MED ORDER — SODIUM CHLORIDE 0.9 % IV SOLN
INTRAVENOUS | Status: DC
  Administered 2014-02-21 – 2014-02-22 (×2): via INTRAVENOUS

## 2014-02-21 NOTE — Clinical Social Work Psychosocial (Signed)
Clinical Social Work Department BRIEF PSYCHOSOCIAL ASSESSMENT 02/21/2014  Patient:  Vincent [name], Vincent [name]     Account Number:  [id number]     Admit date:  02/20/2014  Clinical Social Worker:  Wyatt Haste  Date/Time:  02/21/2014 11:01 AM  Referred by:  Physician  Date Referred:  02/21/2014 Referred for  SNF Placement   Other Referral:   Interview type:  Patient Other interview type:    PSYCHOSOCIAL DATA Living Status:  ALONE Admitted from facility:   Level of care:   Primary support name:  Helene Kelp Primary support relationship to patient:  FRIEND Degree of support available:   supportive per pt    CURRENT CONCERNS Current Concerns  Post-Acute Placement   Other Concerns:    SOCIAL WORK ASSESSMENT / PLAN CSW met with pt at bedside following referral for SNF vs home health. Pt is hard of hearing. Alert and oriented to self and place, but not sure about date. He states he lives alone with his dog and cat. Pt was born in [place] and indicates he has lived here for a long time. His landlord, Helene Kelp is his best support. He reports that he has known her for a long time and she is a friend. Pt states that Helene Kelp is also his POA. Helene Kelp will bring pt meals and check on pt regularly. Pt said she lives just up the street. When asked how pt manages at home, he said he does just fine. PT evaluated pt and recommends home health. Pt is not open to placement of any kind. He appears to be mainly concerned about his animals and wants to return home to be with them. Pt is open to home health as long as it is not too expensive.   Assessment/plan status:  Referral to [employer] Other assessment/ plan:   Information/referral to community resources:   CM for home health    PATIENT'S/FAMILY'S RESPONSE TO PLAN OF CARE: Pt pleasant, but adamant that he will be returning home at d/c. CSW will sign off. CM notified of home health needs.       Benay Pike, [place]

## 2014-02-21 NOTE — Progress Notes (Signed)
[facility] PROGRESS NOTE  Vincent [name]:[id number] DOB: 10/02/1928 DOA: 02/20/2014 PCP: Samuel Jester, DO    Summary: 80 year old male with past medical history of atrial fibrillation noncompliant with anticoagulation, chronic diastolic heart failure, recurrent urinary tract infections due to indwelling Foley catheter, who presented to the emergency department with falls, increased weakness, and nausea and vomiting. Found to have UTI and Acute renal failure  Assessment/Plan:  Sepsis due to pyelonephritis secondary to CAUTI, present at admission.  Max temp 102.7 orally, but leukocytosis resolving -  CXR:  Neg for infiltrate -  F/u blood cultures -  F/u urine culture -  Continue zosyn day 2 -  Resume ciprofloxacin after this course of abx complete  Acute renal failure, creatinine trending up today.  May be due to volume depletion, ATN from hypotension, vancomycin toxicity less likely as he only received one dose Volume status -700.   -  FENa:  Pending but urine sodium < 20 suggestive of dehydration -  Start IVF -  RUS:  No obstruction -  Minimize nephrotoxins -  Will recheck in the a.m.    HYPERTENSION: continues to be somewhat soft.  -  Continue IV fluids.  -  Hold home amlodipine and Lasix  Leukocytosis: Related to urinary tract infection. Trending down this am.  -  Antibiotics as above. He is nontoxic appearing. Will monitor close   Afib: was on dig in past but had issues with bradycardia. Has refused coumadin and xarelto.  -  Continue 81mg  aspirin for stroke prevention.  Currently rate controlled.   Chronic diastolic heart failure. Volume status - . Weight 92.1kg.  Chart review indicates recent EF documented at 65%.  -  Will monitor intake and output and daily weights.  -  Judicious IV fluids and continue to hold lasix  Hyponatremia: related to above. Trending up slightly with IV fluids.  Hypokalemia: related to above. Resolved. Will monitor    OBESITY: BMI 42.1   BENIGN PROSTATIC HYPERTROPHY, HX OF: continue home med    Code Status: DNR Family Communication: POA by phone Disposition Plan: home when ready hopefully 24-48 hours   Consultants:  none  Procedures:  none  Antibiotics:  Zosyn 02/20/14>>  HPI/Subjective: Lying in bed eyes closed. Easily aroused. Denies pain/discomfort.   Objective: Filed Vitals:   02/21/14 0546  BP: 90/73  Pulse: 62  Temp:   Resp: 20    Intake/Output Summary (Last 24 hours) at 02/21/14 0858 Last data filed at 02/20/14 2150  Gross per 24 hour  Intake    340 ml  Output   1050 ml  Net   -710 ml   Filed Weights   02/20/14 0850 02/21/14 0456  Weight: 117.935 kg (260 lb) 92.126 kg (203 lb 1.6 oz)    Exam:   General:  Well nourished NAD  Cardiovascular: RRR +murmur no gallup no LE edema  Respiratory: normal effort Slightly distant. Faint wheeze otherwise clear  Abdomen: obese soft +BS non-tender to palpation  Musculoskeletal: fair muscle tone no clubbing or cyanosis , no edema  Data Reviewed: Basic Metabolic Panel:  Recent Labs Lab 02/20/14 0848 02/21/14 0522  NA 134* 135*  K 3.0* 3.8  CL 91* 93*  CO2 29 30  GLUCOSE 169* 160*  BUN 21 22  CREATININE 1.38* 1.60*  CALCIUM 9.3 8.5   Liver Function Tests:  Recent Labs Lab 02/20/14 0848  AST 33  ALT 14  ALKPHOS 58  BILITOT 1.7*  PROT 7.1  ALBUMIN 3.3*   No  results found for this basename: LIPASE, AMYLASE,  in the last 168 hours No results found for this basename: AMMONIA,  in the last 168 hours CBC:  Recent Labs Lab 02/20/14 0848 02/21/14 0522  WBC 20.4* 15.4*  NEUTROABS 18.0*  --   HGB 13.7 13.3  HCT 40.1 39.8  MCV 87.6 89.6  PLT 203 164   Cardiac Enzymes:  Recent Labs Lab 02/20/14 0848  TROPONINI <0.30   BNP (last 3 results)  Recent Labs  04/17/13 2349 10/09/13 1913  PROBNP 3622.0* 2082.0*   CBG:  Recent Labs Lab 02/21/14 0029  GLUCAP 152*    Recent Results (from  the past 240 hour(s))  CULTURE, BLOOD (ROUTINE X 2)     Status: None   Collection Time    02/20/14  3:01 PM      Result Value Ref Range Status   Specimen Description BLOOD RIGHT HAND   Final   Special Requests BOTTLES DRAWN AEROBIC ONLY 8CC BOTTLE   Final   Culture PENDING   Incomplete   Report Status PENDING   Incomplete  CULTURE, BLOOD (ROUTINE X 2)     Status: None   Collection Time    02/20/14  3:07 PM      Result Value Ref Range Status   Specimen Description BLOOD LEFT FOREARM   Final   Special Requests     Final   Value: BOTTLES DRAWN AEROBIC AND ANAEROBIC 8CC EACH BOTTLE   Culture PENDING   Incomplete   Report Status PENDING   Incomplete     Studies: No results found.  Scheduled Meds: . aspirin EC  81 mg Oral Daily  . docusate sodium  100 mg Oral BID  . enoxaparin (LOVENOX) injection  40 mg Subcutaneous Q24H  . finasteride  5 mg Oral Daily  . ipratropium-albuterol  3 mL Nebulization TID  . nystatin cream  1 application Topical BID  . piperacillin-tazobactam (ZOSYN)  IV  3.375 g Intravenous Q8H  . simvastatin  20 mg Oral QPM  . sodium chloride  3 mL Intravenous Q12H   Continuous Infusions: . sodium chloride      Principal Problem:   Sepsis Active Problems:   OBESITY   HYPERTENSION   ATRIAL FIBRILLATION, CHRONIC   BENIGN PROSTATIC HYPERTROPHY, HX OF   CHF (congestive heart failure)   Catheter-associated urinary tract infection due to indwelling suprapubic catheter   Leukocytosis   Hyponatremia   Hypokalemia   Dehydration   Acute renal failure    Time spent: 30 minutes    Lesle ChrisKaren M Royal Oaks HospitalBlack  [facility] Pager [phone]. If 7PM-7AM, please contact night-coverage at [email or web address], password [email or web address] 02/21/2014, 8:58 AM  LOS: 1 day

## 2014-02-21 NOTE — Evaluation (Signed)
Physical Therapy Evaluation Patient Details Name: Vincent [name] MRN: [id number] DOB: 12/16/1927 Today's Date: 02/21/2014   History of Present Illness  Vincent [name] is a 80 y.o. male with past medical history that includes A. fib currently on anticoagulation, chronic diastolic heart failure, recurrent urinary tract infection do to indwelling catheter, urinary retention presents to the emergency department via EMS from home with the chief complaint of fatigue and emesis. Patient is a very poor historian do to what appears to be underlying dementia. He believes he remembers coming via ambulance. Reports indicate that his landlord who is also his caregiver and healthcare POA found him on the floor this morning and called EMS. Patient denies any recent illness or current pain. He reports some intermittent nausea but denies any vomiting. Denies abdominal pain diarrhea. He states he's been eating or drink and drinking his normal amount. Obvious that information obtained from patient is unreliable do to acute confusion in the setting of dementia. Initial workup in the emergency room he is found to be hypotensive with leukocytosis and urinalysis consistent with UTI. Basic metabolic panel with elevated creatinine and electrolytes consistent with dehydration.  He was given 2 500 ml boluses of normal saline a dose of vancomycin and a dose of Zosyn as well as Zofran.  Clinical Impression  Pt is an 80 yo male who normally ambulates with a cane.  He is not steady enough to ambulate with a cane at this point but needed extensive convincing to use the rolling walker.  Pt states he is by himself most the day which is concerning with his increasing dementia.  Pt would benefit from supervision 24/7 but I am not sure this is possible.     Follow Up Recommendations Home health PT    Equipment Recommendations  None recommended by PT       Precautions / Restrictions Precautions Precautions:  None Restrictions Weight Bearing Restrictions: No      Mobility  Bed Mobility Overal bed mobility: Modified Independent        Transfers Overall transfer level: Modified independent Equipment used: Rolling walker (2 wheeled)         Ambulation/Gait Ambulation/Gait assistance: Supervision Ambulation Distance (Feet): 70 Feet       Gait velocity interpretation: at or above normal speed for age/gender               Pertinent Vitals/Pain None verbalized.    Home Living Family/patient expects to be discharged to:: Private residence Living Arrangements: Alone Available Help at Discharge: Friend(s);Available PRN/intermittently Type of Home: Mobile home Home Access: Stairs to enter Entrance Stairs-Rails: Right;Left;Can reach both Entrance Stairs-Number of Steps: 3 Home Layout: One level Home Equipment: Walker - 2 wheels;Cane - single point;Shower seat      Prior Function Level of Independence: Independent with assistive device(s)         Comments: pt ambulates with a cane     Hand Dominance   Dominant Hand: Right    Extremity/Trunk Assessment        Lower Extremity Assessment: Overall WFL for tasks assessed         Communication   Communication: No difficulties  Cognition Arousal/Alertness: Awake/alert Behavior During Therapy: WFL for tasks assessed/performed Overall Cognitive Status: History of cognitive impairments - at baseline                      Assessment/Plan    PT Assessment Patient needs continued PT services;Patent does not need any  further PT services  PT Diagnosis Difficulty walking due to decreased balance  PT Problem List Decreased activity tolerance  PT Treatment Interventions Gait training;Balance training;Patient/family education   PT Goals (Current goals can be found in the Care Plan section) Acute Rehab PT Goals Patient Stated Goal: Pt wants to go home PT Goal Formulation: With patient Time For Goal  Achievement: 02/23/14 Potential to Achieve Goals: Good    Frequency Min 3X/week   Barriers to discharge  lives alone            End of Session Equipment Utilized During Treatment: Gait belt Activity Tolerance: Patient tolerated treatment well Patient left: in chair;with call bell/phone within reach;with chair alarm set           Time: [id number] PT Time Calculation (min): 25 min   Charges:   PT Evaluation $Initial PT Evaluation Tier I: 1 Procedure     PT G Codes:          Vincent KennedyCynthia J Russell 02/21/2014, 10:43 AM

## 2014-02-21 NOTE — Progress Notes (Signed)
Pt down for renal ultrasound will attempt to see pt later this PM or tomorrow.

## 2014-02-22 LAB — CBC
HEMATOCRIT: 33.7 % — AB (ref 39.0–52.0)
Hemoglobin: 11.4 g/dL — ABNORMAL LOW (ref 13.0–17.0)
MCH: 30.2 pg (ref 26.0–34.0)
MCHC: 33.8 g/dL (ref 30.0–36.0)
MCV: 89.4 fL (ref 78.0–100.0)
Platelets: 147 10*3/uL — ABNORMAL LOW (ref 150–400)
RBC: 3.77 MIL/uL — AB (ref 4.22–5.81)
RDW: 13.9 % (ref 11.5–15.5)
WBC: 9.3 10*3/uL (ref 4.0–10.5)

## 2014-02-22 LAB — BASIC METABOLIC PANEL
BUN: 23 mg/dL (ref 6–23)
CO2: 29 meq/L (ref 19–32)
Calcium: 8.1 mg/dL — ABNORMAL LOW (ref 8.4–10.5)
Chloride: 95 mEq/L — ABNORMAL LOW (ref 96–112)
Creatinine, Ser: 1.32 mg/dL (ref 0.50–1.35)
GFR calc Af Amer: 55 mL/min — ABNORMAL LOW (ref >90)
GFR calc non Af Amer: 48 mL/min — ABNORMAL LOW (ref >90)
Glucose, Bld: 149 mg/dL — ABNORMAL HIGH (ref 70–99)
POTASSIUM: 3.5 meq/L — AB (ref 3.7–5.3)
SODIUM: 134 meq/L — AB (ref 137–147)

## 2014-02-22 LAB — URINE CULTURE

## 2014-02-22 MED ORDER — POTASSIUM CHLORIDE CRYS ER 20 MEQ PO TBCR
20.0000 meq | EXTENDED_RELEASE_TABLET | Freq: Once | ORAL | Status: AC
  Administered 2014-02-22: 20 meq via ORAL
  Filled 2014-02-22: qty 1

## 2014-02-22 MED ORDER — POTASSIUM CHLORIDE CRYS ER 20 MEQ PO TBCR
40.0000 meq | EXTENDED_RELEASE_TABLET | Freq: Once | ORAL | Status: AC
  Administered 2014-02-22: 40 meq via ORAL
  Filled 2014-02-22: qty 2

## 2014-02-22 MED ORDER — CIPROFLOXACIN HCL 500 MG PO TABS: 500.0000 mg | ORAL_TABLET | Freq: Two times a day (BID) | ORAL | Status: DC

## 2014-02-22 NOTE — Progress Notes (Signed)
ANTIBIOTIC CONSULT NOTE -   Pharmacy Consult for Zosyn  Indication: UTI  No Known Allergies  Patient Measurements: Height: 5\' 6"  (167.6 cm) Weight: 198 lb 11.2 oz (90.13 kg) IBW/kg (Calculated) : 63.8  Vital Signs: BP: 118/74 mmHg (04/09 0617) Pulse Rate: 53 (04/09 0617) Intake/Output from previous day: 04/08 0701 - 04/09 0700 In: 986.3 [P.O.:480; I.V.:506.3] Out: 525 [Urine:525] Intake/Output from this shift:    Labs:  Recent Labs  02/20/14 0848 02/21/14 0522 02/22/14 0533  WBC 20.4* 15.4* 9.3  HGB 13.7 13.3 11.4*  PLT 203 164 147*  CREATININE 1.38* 1.60* 1.32   Estimated Creatinine Clearance: 43.8 ml/min (by C-G formula based on Cr of 1.32). No results found for this basename: VANCOTROUGH, Leodis Binet, VANCORANDOM, GENTTROUGH, GENTPEAK, GENTRANDOM, TOBRATROUGH, TOBRAPEAK, TOBRARND, AMIKACINPEAK, AMIKACINTROU, AMIKACIN,  in the last 72 hours   Microbiology: Recent Results (from the past 720 hour(s))  URINE CULTURE     Status: None   Collection Time    02/20/14 10:52 AM      Result Value Ref Range Status   Specimen Description URINE, SUPRAPUBIC   Final   Special Requests NONE   Final   Culture  Setup Time     Final   Value: 02/20/2014 18:31     Performed at [facility] Count     Final   Value: >=100,000 COLONIES/ML     Performed at [facility]   Culture     Final   Value: ESCHERICHIA COLI     Performed at [facility]   Report Status 02/22/2014 FINAL   Final   Organism ID, Bacteria ESCHERICHIA COLI   Final  CULTURE, BLOOD (ROUTINE X 2)     Status: None   Collection Time    02/20/14  3:01 PM      Result Value Ref Range Status   Specimen Description BLOOD RIGHT HAND   Final   Special Requests BOTTLES DRAWN AEROBIC ONLY 8CC BOTTLE   Final   Culture NO GROWTH 1 DAY   Final   Report Status PENDING   Incomplete  CULTURE, BLOOD (ROUTINE X 2)     Status: None   Collection Time    02/20/14  3:07 PM      Result Value Ref Range  Status   Specimen Description BLOOD LEFT FOREARM   Final   Special Requests     Final   Value: BOTTLES DRAWN AEROBIC AND ANAEROBIC 8CC EACH BOTTLE   Culture NO GROWTH 1 DAY   Final   Report Status PENDING   Incomplete   Medical History: Past Medical History  Diagnosis Date  . Urine retention   . Atrial fibrillation   . Lumbar spondylosis   . Prostate hypertrophy   . Arachnoid cyst   . Hypertension   . CHF (congestive heart failure)   . UTI (lower urinary tract infection)   . Sepsis   . Dementia   . Urine retention    Assessment: 80yo male with recurrent UTI due to indwelling catheter.  Pt is obese.  Estimated Creatinine Clearance: 43.8 ml/min (by C-G formula based on Cr of 1.32).   SCr improved.  Urine cx noted.  Culture -        Result:        ESCHERICHIA COLI Performed at [facility]      Report Status 02/22/2014 FINAL        Organism ID, Bacteria ESCHERICHIA COLI      Culture &  Susceptibility    ESCHERICHIA COLI    Antibiotic Sensitivity Microscan Status    AMPICILLIN Sensitive 8 SENSITIVE Final    Method: MIC    CEFAZOLIN Sensitive <=4 SENSITIVE Final    Method: MIC    CEFTRIAXONE Sensitive <=1 SENSITIVE Final    Method: MIC    CIPROFLOXACIN Sensitive <=0.25 SENSITIVE Final    Method: MIC    GENTAMICIN Sensitive <=1 SENSITIVE Final    Method: MIC    LEVOFLOXACIN Sensitive 1 SENSITIVE Final    Method: MIC    NITROFURANTOIN Intermediate 64 INTERMEDIATE Final    Method: MIC    PIP/TAZO Sensitive <=4 SENSITIVE Final    Method: MIC    TOBRAMYCIN Sensitive <=1 SENSITIVE Final    Method: MIC    TRIMETH/SULFA Sensitive <=20 SENSITIVE Final    Method: MIC    Comments ESCHERICHIA COLI (MIC)    ESCHERICHIA COLI    Goal of Therapy:  Eradicate infection.  Plan:   Continue Zosyn 3.375gm IV q8h, each dose over 4 hrs.  Consider switch antibiotic to PO Cipro (sensitive)  Monitor labs, renal fxn, and cultures  Naraya Stoneberg A Maija Biggers 02/22/2014,10:36 AM

## 2014-02-22 NOTE — Discharge Summary (Signed)
Physician Discharge Summary  Vincent [name] ZOX:[id number] DOB: [birth year] DOA: 02/20/2014  PCP: Vincent Jester, DO  Admit date: 02/20/2014 Discharge date: 02/22/2014  Time spent: 40 minutes  Recommendations for Outpatient Follow-up:  1. Discharged to home with Home health PT and RN. Recommend close monitoring for change in health status 2. Follow up with PCP in 1-2 weeks for evaluation of UTI resolution. Recommend cbc and bmet to evaluation Hg platelets and kidney function.   Discharge Diagnoses:  Principal Problem:   Sepsis Active Problems:   OBESITY   HYPERTENSION   ATRIAL FIBRILLATION, CHRONIC   BENIGN PROSTATIC HYPERTROPHY, HX OF   CHF (congestive heart failure)   Catheter-associated urinary tract infection due to indwelling suprapubic catheter   Leukocytosis   Hyponatremia   Hypokalemia   Dehydration   Acute renal failure   Discharge Condition: stable  Diet recommendation: heart healthy  Filed Weights   02/20/14 0850 02/21/14 0456 02/22/14 0333  Weight: 117.935 kg (260 lb) 92.126 kg (203 lb 1.6 oz) 90.13 kg (198 lb 11.2 oz)    History of present illness:  Vincent [name] is a 80 y.o. male with past medical history that includes A. fib  Not currently on anticoagulation, chronic diastolic heart failure, recurrent urinary tract infection do to indwelling catheter, urinary retention presented to the emergency department via EMS from home on 02/20/14 with the chief complaint of fatigue and emesis. Patient very poor historian due to what appears to be underlying dementia. He believed he remembered coming via ambulance. Report indicated that his landlord who is also his caregiver and healthcare POA found him on the floor that  morning and called EMS. Patient denied any recent illness or current pain. He reported some intermittent nausea but denied any vomiting. Denied abdominal pain diarrhea. He stated he'd been eating or drink and drinking his normal amount. Obvious that  information obtained from patient unreliable due to acute confusion in the setting of dementia. Initial workup in the emergency room he was found to be hypotensive with leukocytosis and urinalysis consistent with UTI. Basic metabolic panel with elevated creatinine and electrolytes consistent with dehydration. Chest x-ray without infiltrate. He was given 2 500 ml boluses of normal saline a dose of vancomycin and a dose of Zosyn as well as Zofran.  At the time of admission he was hemodynamically stable afebrile mildly hypoxic with oxygen saturations of 92% on 3 L. He was alert and oriented to self and place.   Hospital Course:  Sepsis due to pyelonephritis secondary to CAUTI, present at admission. Max temp 102.7 orally, with leukocytosis. CXR: Neg for infiltrate Blood cultures no growth to date. Urine culture >100,000 colonies E coli. Given  Zosyn during hospitalization as home med list included daily cipro. After speaking with caregiver it was determined that patient has not been getting cipro and urine susceptibility consistent with this. Will discharge on cipro. Recommend close follow up with PCP for resolution of UTI.   Acute renal failure,  Resolved at discharge. May be due to volume depletion, ATN from hypotension, vancomycin toxicity less likely as he only received one dose Volume status -240.  FENa: Pending but urine sodium < 20 suggestive of dehydration. RUS: No obstruction  HYPERTENSION: continues to be somewhat soft. Responded to IV fluids.  Recommend continuing to hold norvasc at discharge.    Leukocytosis: Related to urinary tract infection. Resolved at discharge. Antibiotics as above. He remained nontoxic appearing.    Afib: was on dig in past but had issues  with bradycardia. Has refused coumadin and xarelto.  - Continue 81mg  aspirin for stroke prevention. Rate controlled.   Chronic diastolic heart failure. Volume status - .  Chart review indicates recent EF documented at 65%.  Lasix  was held due to dehydration and hypotension at admission.  Will resume daily lasix as long as he is eating and drinking well.  Advised to stop lasix if caregivers are concerned he is becoming dehydrated.    Hyponatremia: related to above. Stable at discharge.   Hypokalemia: related to above. Mild. Resolved with supplementation   OBESITY: BMI 42.1   BENIGN PROSTATIC HYPERTROPHY, HX OF: continue home med    Procedures: Consultations:  none  Discharge Exam: Filed Vitals:   02/22/14 0617  BP: 118/74  Pulse: 53  Temp:   Resp: 20    General: Well nourished NAD  Cardiovascular: RRR +murmur no gallup no LE edema  Respiratory: normal effort Slightly distant. Faint wheeze otherwise clear  Abdomen: obese soft +BS non-tender to palpation  Musculoskeletal: fair muscle tone no clubbing or cyanosis , no edema  Discharge Instructions You were cared for by a hospitalist during your hospital stay. If you have any questions about your discharge medications or the care you received while you were in the hospital after you are discharged, you can call the unit and asked to speak with the hospitalist on call if the hospitalist that took care of you is not available. Once you are discharged, your primary care physician will handle any further medical issues. Please note that NO REFILLS for any discharge medications will be authorized once you are discharged, as it is imperative that you return to your primary care physician (or establish a relationship with a primary care physician if you do not have one) for your aftercare needs so that they can reassess your need for medications and monitor your lab values.      Discharge Orders   Future Orders Complete By Expires   Diet - low sodium heart healthy  As directed    Discharge instructions  As directed    Increase activity slowly  As directed        Medication List    STOP taking these medications       amLODipine 10 MG tablet  Commonly known  as:  NORVASC      TAKE these medications       albuterol 108 (90 BASE) MCG/ACT inhaler  Commonly known as:  PROVENTIL HFA;VENTOLIN HFA  Inhale 2 puffs into the lungs every 6 (six) hours as needed. For asthma     aspirin 81 MG tablet  Take 81 mg by mouth daily.     budesonide-formoterol 80-4.5 MCG/ACT inhaler  Commonly known as:  SYMBICORT  Inhale 2 puffs into the lungs 2 (two) times daily as needed. Shortness of Breath     ciprofloxacin 500 MG tablet  Commonly known as:  CIPRO  Take 1 tablet (500 mg total) by mouth 2 (two) times daily.     finasteride 5 MG tablet  Commonly known as:  PROSCAR  Take 5 mg by mouth daily.     furosemide 40 MG tablet  Commonly known as:  LASIX  Take 1 tablet (40 mg total) by mouth daily.     guaiFENesin-codeine 100-10 MG/5ML syrup  Take 5 mLs by mouth every 6 (six) hours as needed for cough.     nystatin cream  Commonly known as:  MYCOSTATIN  Apply 1 application topically 2 (two) times daily. Apply  to abdominal rash     oxyCODONE-acetaminophen 5-325 MG per tablet  Commonly known as:  PERCOCET/ROXICET  Take 1 tablet by mouth every 4 (four) hours as needed. Pain     Potassium Chloride ER 20 MEQ Tbcr  Take 20 mEq by mouth daily.     sennosides-docusate sodium 8.6-50 MG tablet  Commonly known as:  SENOKOT-S  Take 1 tablet by mouth daily as needed for constipation.     simvastatin 20 MG tablet  Commonly known as:  ZOCOR  Take 1 tablet (20 mg total) by mouth every evening.     tamsulosin 0.4 MG Caps capsule  Commonly known as:  FLOMAX  Take 0.4 mg by mouth daily.       No Known Allergies Follow-up Information   Follow up with BUTLER, CYNTHIA, DO. Schedule an appointment as soon as possible for a visit in 1 week. (follow up on UTI )    Contact information:   [address] [place] [address] [phone]       The results of significant diagnostics from this hospitalization (including imaging, microbiology, ancillary and  laboratory) are listed below for reference.    Significant Diagnostic Studies: Dg Chest 1 View  02/21/2014   CLINICAL DATA:  Fever, history hypertension, atrial fibrillation, CHF  EXAM: CHEST - 1 VIEW  COMPARISON:  01/02/2014  FINDINGS: Enlargement of cardiac silhouette.  Atherosclerotic calcification aorta.  Pulmonary vascularity normal.  Right basilar atelectasis.  No definite acute infiltrate, pleural effusion or pneumothorax.  No acute osseous findings.  IMPRESSION: Enlargement of cardiac silhouette with minimal right basilar atelectasis.   Electronically Signed   By: Ulyses SouthwardMark  Boles M.D.   On: 02/21/2014 09:38   [place] Renal  02/21/2014   CLINICAL DATA:  Acute renal injury  EXAM: RENAL/URINARY TRACT ULTRASOUND COMPLETE  COMPARISON:  US PELVIS LIMITED dated 08/10/2013; US RENAL/AORTA dated 02/03/2005  FINDINGS: Right Kidney:  Length: 12.2 cm next annual in the midpole there is a simple appearing cystic structure measuring 4.2 x 3.9 x 3.7 cm. Echogenicity within normal limits. No hydronephrosis visualized.  Left Kidney:  Length: 12.3 cm. Echogenicity within normal limits. No mass or hydronephrosis visualized.  Bladder:  The urinary bladder is decompressed by a Foley catheter.  IMPRESSION: There is no evidence of urinary tract obstruction or other acute abnormality of the kidneys. A simple appearing midpole cyst on the right is present which appears to have increased in size since the previous study.   Electronically Signed   By: David  Swazil[place]Jordan   On: 02/21/2014 09:32    Microbiology: Recent Results (from the past 240 hour(s))  URINE CULTURE     Status: None   Collection Time    02/20/14 10:52 AM      Result Value Ref Range Status   Specimen Description URINE, SUPRAPUBIC   Final   Special Requests NONE   Final   Culture  Setup Time     Final   Value: 02/20/2014 18:31     Performed at [facility]   Colony Count     Final   Value: >=100,000 COLONIES/ML     Performed at [facility]    Culture     Final   Value: ESCHERICHIA COLI     Performed at [facility]   Report Status 02/22/2014 FINAL   Final   Organism ID, Bacteria ESCHERICHIA COLI   Final  CULTURE, BLOOD (ROUTINE X 2)     Status: None   Collection  Time    02/20/14  3:01 PM      Result Value Ref Range Status   Specimen Description BLOOD RIGHT HAND   Final   Special Requests BOTTLES DRAWN AEROBIC ONLY 8CC BOTTLE   Final   Culture NO GROWTH 1 DAY   Final   Report Status PENDING   Incomplete  CULTURE, BLOOD (ROUTINE X 2)     Status: None   Collection Time    02/20/14  3:07 PM      Result Value Ref Range Status   Specimen Description BLOOD LEFT FOREARM   Final   Special Requests     Final   Value: BOTTLES DRAWN AEROBIC AND ANAEROBIC 8CC EACH BOTTLE   Culture NO GROWTH 1 DAY   Final   Report Status PENDING   Incomplete     Labs: Basic Metabolic Panel:  Recent Labs Lab 02/20/14 0848 02/21/14 0522 02/22/14 0533  NA 134* 135* 134*  K 3.0* 3.8 3.5*  CL 91* 93* 95*  CO2 29 30 29   GLUCOSE 169* 160* 149*  BUN 21 22 23   CREATININE 1.38* 1.60* 1.32  CALCIUM 9.3 8.5 8.1*   Liver Function Tests:  Recent Labs Lab 02/20/14 0848  AST 33  ALT 14  ALKPHOS 58  BILITOT 1.7*  PROT 7.1  ALBUMIN 3.3*   No results found for this basename: LIPASE, AMYLASE,  in the last 168 hours No results found for this basename: AMMONIA,  in the last 168 hours CBC:  Recent Labs Lab 02/20/14 0848 02/21/14 0522 02/22/14 0533  WBC 20.4* 15.4* 9.3  NEUTROABS 18.0*  --   --   HGB 13.7 13.3 11.4*  HCT 40.1 39.8 33.7*  MCV 87.6 89.6 89.4  PLT 203 164 147*   Cardiac Enzymes:  Recent Labs Lab 02/20/14 0848  TROPONINI <0.30   BNP: BNP (last 3 results)  Recent Labs  04/17/13 2349 10/09/13 1913  PROBNP 3622.0* 2082.0*   CBG:  Recent Labs Lab 02/21/14 0029  GLUCAP 152*       Signed:  Renae Fickle  [facility] 02/22/2014, 12:26 PM

## 2014-02-22 NOTE — Care Management Note (Signed)
    Page 1 of 1   02/22/2014     2:31:01 PM   CARE MANAGEMENT NOTE 02/22/2014  Patient:  Vincent [name],Vincent [name]   Account Number:  [id number]  Date Initiated:  02/22/2014  Documentation initiated by:  Rosemary HolmsOBSON,Deyani Hegarty  Subjective/Objective Assessment:   Pt admitted from home where he lives alone with his dog and cat. [facility]     Action/Plan:   Anticipated DC Date:  02/22/2014   Anticipated DC Plan:  [name]      DC Planning Services  CM consult      [facility]   Choice offered to / List presented to:  C-1 Patient        HH arranged  HH-1 RN  HH-2 PT      [facility]   Status of service:  Completed, signed off Medicare Important Message given?  NA - LOS <3 / Initial given by admissions (If response is "NO", the following Medicare IM given date fields will be blank) Date Medicare IM given:   Date Additional Medicare IM given:    Discharge Disposition:  [name]  Per UR Regulation:    If discussed at Long Length of Stay Meetings, dates discussed:    Comments:  02/22/14 Rosemary HolmsAmy Alson Mcpheeters RN BSN CM Pt is very hard of hearing. His friend Fayrene FearingJames is at bedside to take pt home. Fayrene FearingJames states that pt will not answer phone so [facility] coming out to Pt's home. Pt has a dog that bites and will need to lock him up. Fayrene FearingJames' cell is [phone], home [phone]. Alroy BailiffLinda Lothian notified of referral

## 2014-02-22 NOTE — Progress Notes (Signed)
Patient discharged home with caregiver,and instructions given on medications,and follow up visits,patient,and his son verbalized understanding,No c/o pain or discomfort noted. Accompanied by staff to an awaiting vehicle.[facility] to follow up with patient.

## 2014-02-25 LAB — CULTURE, BLOOD (ROUTINE X 2)
CULTURE: NO GROWTH
Culture: NO GROWTH

## 2014-03-16 ENCOUNTER — Ambulatory Visit (INDEPENDENT_AMBULATORY_CARE_PROVIDER_SITE_OTHER): Payer: Medicare Other | Admitting: Urology

## 2014-03-16 DIAGNOSIS — R339 Retention of urine, unspecified: Secondary | ICD-10-CM

## 2014-04-13 ENCOUNTER — Ambulatory Visit: Payer: Medicare Other | Admitting: Urology

## 2014-04-20 ENCOUNTER — Ambulatory Visit (INDEPENDENT_AMBULATORY_CARE_PROVIDER_SITE_OTHER): Payer: Medicare Other | Admitting: Urology

## 2014-04-20 DIAGNOSIS — R339 Retention of urine, unspecified: Secondary | ICD-10-CM

## 2014-05-25 ENCOUNTER — Ambulatory Visit (INDEPENDENT_AMBULATORY_CARE_PROVIDER_SITE_OTHER): Payer: Medicare Other | Admitting: Urology

## 2014-05-25 DIAGNOSIS — R339 Retention of urine, unspecified: Secondary | ICD-10-CM

## 2014-06-29 ENCOUNTER — Ambulatory Visit (INDEPENDENT_AMBULATORY_CARE_PROVIDER_SITE_OTHER): Payer: Medicare Other | Admitting: Urology

## 2014-06-29 DIAGNOSIS — R339 Retention of urine, unspecified: Secondary | ICD-10-CM

## 2014-08-03 ENCOUNTER — Ambulatory Visit (INDEPENDENT_AMBULATORY_CARE_PROVIDER_SITE_OTHER): Payer: Medicare Other | Admitting: Urology

## 2014-08-03 DIAGNOSIS — R339 Retention of urine, unspecified: Secondary | ICD-10-CM

## 2014-09-07 ENCOUNTER — Ambulatory Visit (INDEPENDENT_AMBULATORY_CARE_PROVIDER_SITE_OTHER): Payer: Medicare Other | Admitting: Urology

## 2014-09-07 DIAGNOSIS — R3914 Feeling of incomplete bladder emptying: Secondary | ICD-10-CM

## 2014-10-01 ENCOUNTER — Encounter (HOSPITAL_COMMUNITY): Payer: Self-pay | Admitting: Emergency Medicine

## 2014-10-01 ENCOUNTER — Emergency Department (HOSPITAL_COMMUNITY)
Admission: EM | Admit: 2014-10-01 | Discharge: 2014-10-01 | Disposition: A | Discharge status: Home / Self Care | Payer: Medicare Other | Attending: Emergency Medicine | Admitting: Emergency Medicine

## 2014-10-01 DIAGNOSIS — Z8619 Personal history of other infectious and parasitic diseases: Secondary | ICD-10-CM | POA: Insufficient documentation

## 2014-10-01 DIAGNOSIS — I10 Essential (primary) hypertension: Secondary | ICD-10-CM | POA: Insufficient documentation

## 2014-10-01 DIAGNOSIS — Z792 Long term (current) use of antibiotics: Secondary | ICD-10-CM | POA: Insufficient documentation

## 2014-10-01 DIAGNOSIS — Z7982 Long term (current) use of aspirin: Secondary | ICD-10-CM | POA: Insufficient documentation

## 2014-10-01 DIAGNOSIS — Y846 Urinary catheterization as the cause of abnormal reaction of the patient, or of later complication, without mention of misadventure at the time of the procedure: Secondary | ICD-10-CM | POA: Insufficient documentation

## 2014-10-01 DIAGNOSIS — T83098A Other mechanical complication of other indwelling urethral catheter, initial encounter: Secondary | ICD-10-CM | POA: Diagnosis present

## 2014-10-01 DIAGNOSIS — Z7951 Long term (current) use of inhaled steroids: Secondary | ICD-10-CM | POA: Diagnosis not present

## 2014-10-01 DIAGNOSIS — N400 Enlarged prostate without lower urinary tract symptoms: Secondary | ICD-10-CM | POA: Insufficient documentation

## 2014-10-01 DIAGNOSIS — I509 Heart failure, unspecified: Secondary | ICD-10-CM | POA: Diagnosis not present

## 2014-10-01 DIAGNOSIS — Z87891 Personal history of nicotine dependence: Secondary | ICD-10-CM | POA: Insufficient documentation

## 2014-10-01 DIAGNOSIS — Z79899 Other long term (current) drug therapy: Secondary | ICD-10-CM | POA: Diagnosis not present

## 2014-10-01 DIAGNOSIS — Z8669 Personal history of other diseases of the nervous system and sense organs: Secondary | ICD-10-CM | POA: Diagnosis not present

## 2014-10-01 DIAGNOSIS — F0390 Unspecified dementia without behavioral disturbance: Secondary | ICD-10-CM | POA: Diagnosis not present

## 2014-10-01 DIAGNOSIS — Z87442 Personal history of urinary calculi: Secondary | ICD-10-CM | POA: Diagnosis not present

## 2014-10-01 DIAGNOSIS — M47816 Spondylosis without myelopathy or radiculopathy, lumbar region: Secondary | ICD-10-CM | POA: Diagnosis not present

## 2014-10-01 DIAGNOSIS — T839XXA Unspecified complication of genitourinary prosthetic device, implant and graft, initial encounter: Secondary | ICD-10-CM

## 2014-10-01 LAB — URINALYSIS, ROUTINE W REFLEX MICROSCOPIC
BILIRUBIN URINE: NEGATIVE
GLUCOSE, UA: NEGATIVE mg/dL
Ketones, ur: NEGATIVE mg/dL
Nitrite: NEGATIVE
PH: 5 (ref 5.0–8.0)
Protein, ur: 100 mg/dL — AB
SPECIFIC GRAVITY, URINE: 1.025 (ref 1.005–1.030)
UROBILINOGEN UA: 0.2 mg/dL (ref 0.0–1.0)

## 2014-10-01 LAB — BASIC METABOLIC PANEL
Anion gap: 12 (ref 5–15)
BUN: 29 mg/dL — ABNORMAL HIGH (ref 6–23)
CO2: 27 mEq/L (ref 19–32)
CREATININE: 1.31 mg/dL (ref 0.50–1.35)
Calcium: 9.6 mg/dL (ref 8.4–10.5)
Chloride: 98 mEq/L (ref 96–112)
GFR calc non Af Amer: 48 mL/min — ABNORMAL LOW (ref >90)
GFR, EST AFRICAN AMERICAN: 56 mL/min — AB (ref >90)
GLUCOSE: 153 mg/dL — AB (ref 70–99)
Potassium: 4 mEq/L (ref 3.7–5.3)
Sodium: 137 mEq/L (ref 137–147)

## 2014-10-01 LAB — CBC WITH DIFFERENTIAL/PLATELET
BASOS PCT: 0 % (ref 0–1)
Basophils Absolute: 0 10*3/uL (ref 0.0–0.1)
Eosinophils Absolute: 0 10*3/uL (ref 0.0–0.7)
Eosinophils Relative: 0 % (ref 0–5)
HCT: 44.5 % (ref 39.0–52.0)
HEMOGLOBIN: 15.1 g/dL (ref 13.0–17.0)
LYMPHS ABS: 0.6 10*3/uL — AB (ref 0.7–4.0)
Lymphocytes Relative: 4 % — ABNORMAL LOW (ref 12–46)
MCH: 30.6 pg (ref 26.0–34.0)
MCHC: 33.9 g/dL (ref 30.0–36.0)
MCV: 90.1 fL (ref 78.0–100.0)
MONO ABS: 1.4 10*3/uL — AB (ref 0.1–1.0)
Monocytes Relative: 10 % (ref 3–12)
Neutro Abs: 12.1 10*3/uL — ABNORMAL HIGH (ref 1.7–7.7)
Neutrophils Relative %: 86 % — ABNORMAL HIGH (ref 43–77)
Platelets: 181 10*3/uL (ref 150–400)
RBC: 4.94 MIL/uL (ref 4.22–5.81)
RDW: 13.8 % (ref 11.5–15.5)
WBC: 14.1 10*3/uL — ABNORMAL HIGH (ref 4.0–10.5)

## 2014-10-01 LAB — URINE MICROSCOPIC-ADD ON

## 2014-10-01 NOTE — Discharge Instructions (Signed)
Follow with urology.  

## 2014-10-01 NOTE — ED Notes (Signed)
Patient and caregiver verbalize understanding of discharge instructions, home care, and follow up care. Patient ambulatory out of department at this time with caregiver.

## 2014-10-01 NOTE — ED Notes (Signed)
Per patient and caregiver. Patients super pubic catheter was not draining into leg bag today. Upon this assessment, 450cc of urine was emptied from patients leg bag.

## 2014-10-01 NOTE — ED Notes (Signed)
Pt has suprapubic catheter, states it is not draining, pt complain of nausea, [facility] and [facility], still has not worked correctly

## 2014-10-01 NOTE — ED Provider Notes (Signed)
CSN: [id number]     Arrival date & time 10/01/14  1707 History   First MD Initiated Contact with Patient 10/01/14 2101     Chief Complaint  Patient presents with  . Suprapubic catheter    . Back Pain     (Consider location/radiation/quality/duration/timing/severity/associated sxs/prior Treatment) HPI  Patient has a history of urinary retention and did have a Foley catheter for a long time with frequent ED visits for malfunction, he now has a suprapubic catheter. Patient reports his suprapubic catheter was changed 3 days ago at his urologist office. His family report he has been having drainage around the super pubic site since they changed it. They report today the catheter did not drain at all. He reports when they attempted to flush the catheter the fluid would come out around the catheter site and they were unable to withdrawal fluid through the tube. Patient keeps complaining of the feeling he needs to void. He does not have any  Vomiting, but is having some nausea. He also started complaining of chills while he was in the ED.  PCP Dr Charm BargesButler.  Past Medical History  Diagnosis Date  . Urine retention   . Atrial fibrillation   . Lumbar spondylosis   . Prostate hypertrophy   . Arachnoid cyst   . Hypertension   . CHF (congestive heart failure)   . UTI (lower urinary tract infection)   . Sepsis   . Dementia   . Urine retention    Past Surgical History  Procedure Laterality Date  . Suprapubic catheter placement    . Transurethral resection of prostate    . Renal abscess drainage     Family History  Problem Relation Age of Onset  . Family history unknown: Yes   History  Substance Use Topics  . Smoking status: Former Smoker    Types: Cigarettes  . Smokeless tobacco: Never Used  . Alcohol Use: No    Review of Systems  All other systems reviewed and are negative.     Allergies  Review of patient's allergies indicates no known allergies.  Home Medications   Prior  to Admission medications   Medication Sig Start Date End Date Taking? Authorizing Provider  tamsulosin (FLOMAX) 0.4 MG CAPS Take 0.4 mg by mouth daily.   Yes Historical Provider, MD  albuterol (PROVENTIL HFA;VENTOLIN HFA) 108 (90 BASE) MCG/ACT inhaler Inhale 2 puffs into the lungs every 6 (six) hours as needed. For asthma    Historical Provider, MD  aspirin 81 MG tablet Take 81 mg by mouth daily.    Historical Provider, MD  budesonide-formoterol (SYMBICORT) 80-4.5 MCG/ACT inhaler Inhale 2 puffs into the lungs 2 (two) times daily as needed. Shortness of Breath     Historical Provider, MD  ciprofloxacin (CIPRO) 500 MG tablet Take 1 tablet (500 mg total) by mouth 2 (two) times daily. 02/22/14   Gwenyth BenderKaren M Black, NP  finasteride (PROSCAR) 5 MG tablet Take 5 mg by mouth daily.    Historical Provider, MD  furosemide (LASIX) 40 MG tablet Take 1 tablet (40 mg total) by mouth daily. 10/10/13   Renae FickleMackenzie Short, MD  guaiFENesin-codeine 100-10 MG/5ML syrup Take 5 mLs by mouth every 6 (six) hours as needed for cough. 01/02/14   Geoffery Lyonsouglas Delo, MD  nystatin cream (MYCOSTATIN) Apply 1 application topically 2 (two) times daily. Apply to abdominal rash 10/10/13   Renae FickleMackenzie Short, MD  oxyCODONE-acetaminophen (PERCOCET/ROXICET) 5-325 MG per tablet Take 1 tablet by mouth every 4 (four) hours as needed.  Pain    Historical Provider, MD  potassium chloride 20 MEQ TBCR Take 20 mEq by mouth daily. 10/10/13   Renae Fickle, MD  sennosides-docusate sodium (SENOKOT-S) 8.6-50 MG tablet Take 1 tablet by mouth daily as needed for constipation.    Historical Provider, MD  simvastatin (ZOCOR) 20 MG tablet Take 1 tablet (20 mg total) by mouth every evening. 07/29/13   Erick Blinks, MD   BP 138/60 mmHg  Pulse 73  Temp(Src) 97.6 F (36.4 C) (Oral)  Resp 18  Ht 5\' 6"  (1.676 m)  Wt 218 lb (98.884 kg)  BMI 35.20 kg/m2  SpO2 93% Physical Exam  Constitutional: He is oriented to person, place, and time. He appears well-developed and  well-nourished.  Non-toxic appearance. He does not appear ill. No distress.  HENT:  Head: Normocephalic and atraumatic.  Right Ear: External ear normal.  Left Ear: External ear normal.  Nose: Nose normal. No mucosal edema or rhinorrhea.  Mouth/Throat: Oropharynx is clear and moist and mucous membranes are normal. No dental abscesses or uvula swelling.  Eyes: Conjunctivae and EOM are normal. Pupils are equal, round, and reactive to light.  Neck: Normal range of motion and full passive range of motion without pain. Neck supple.  Cardiovascular: Normal rate, regular rhythm and normal heart sounds.  Exam reveals no gallop and no friction rub.   No murmur heard. Pulmonary/Chest: Effort normal and breath sounds normal. No respiratory distress. He has no wheezes. He has no rhonchi. He has no rales. He exhibits no tenderness and no crepitus.  Abdominal: Soft. Normal appearance and bowel sounds are normal. He exhibits no distension. There is no tenderness. There is no rebound and no guarding.  Has mild distention without tenderness  Genitourinary:  Patient has urine in his leg bag  Musculoskeletal: Normal range of motion. He exhibits no edema or tenderness.  Moves all extremities well.   Neurological: He is alert and oriented to person, place, and time. He has normal strength. No cranial nerve deficit.  Skin: Skin is warm, dry and intact. No rash noted. No erythema. No pallor.  Psychiatric: He has a normal mood and affect. His speech is normal and behavior is normal. His mood appears not anxious.  Nursing note and vitals reviewed.   ED Course  Procedures (including critical care time)  Patient has prior past history of multiple ED visits for feeling his Foley catheter was malfunctioning before he got his suprapubic catheter. It was felt with his dementia he was focused on his Foley catheter. This catheter appears to be functioning fine at this time.  Bladder scan shows 0 cc of urine. Nurses  report his leg bag had 450 cc of urine in it when he was placed in a room.   Dr Rubin Payor will discharge after reviewing his UA.    Labs Review Results for orders placed or performed during the hospital encounter of 10/01/14  CBC with Differential  Result Value Ref Range   WBC 14.1 (H) 4.0 - 10.5 K/uL   RBC 4.94 4.22 - 5.81 MIL/uL   Hemoglobin 15.1 13.0 - 17.0 g/dL   HCT 16.1 09.6 - 04.5 %   MCV 90.1 78.0 - 100.0 fL   MCH 30.6 26.0 - 34.0 pg   MCHC 33.9 30.0 - 36.0 g/dL   RDW 40.9 81.1 - 91.4 %   Platelets 181 150 - 400 K/uL   Neutrophils Relative % 86 (H) 43 - 77 %   Neutro Abs 12.1 (H) 1.7 - 7.7  K/uL   Lymphocytes Relative 4 (L) 12 - 46 %   Lymphs Abs 0.6 (L) 0.7 - 4.0 K/uL   Monocytes Relative 10 3 - 12 %   Monocytes Absolute 1.4 (H) 0.1 - 1.0 K/uL   Eosinophils Relative 0 0 - 5 %   Eosinophils Absolute 0.0 0.0 - 0.7 K/uL   Basophils Relative 0 0 - 1 %   Basophils Absolute 0.0 0.0 - 0.1 K/uL  Basic metabolic panel  Result Value Ref Range   Sodium 137 137 - 147 mEq/L   Potassium 4.0 3.7 - 5.3 mEq/L   Chloride 98 96 - 112 mEq/L   CO2 27 19 - 32 mEq/L   Glucose, Bld 153 (H) 70 - 99 mg/dL   BUN 29 (H) 6 - 23 mg/dL   Creatinine, Ser 1.301.31 0.50 - 1.35 mg/dL   Calcium 9.6 8.4 - 86.510.5 mg/dL   GFR calc non Af Amer 48 (L) >90 mL/min   GFR calc Af Amer 56 (L) >90 mL/min   Anion gap 12 5 - 15    Laboratory interpretation all normal except stable CRI, UA pending    Imaging Review No results found.   EKG Interpretation None      MDM   Final diagnoses:  Foley catheter problem, initial encounter    Plan discharge  Devoria AlbeIva Malillany Kazlauskas, MD, Franz DellFACEP   Zinnia Tindall L Jordanny Waddington, MD 10/02/14 2053

## 2014-10-01 NOTE — ED Notes (Signed)
Bladder scan revealed 0 ml in bladder.  

## 2014-11-02 ENCOUNTER — Ambulatory Visit (INDEPENDENT_AMBULATORY_CARE_PROVIDER_SITE_OTHER): Payer: Medicare Other | Admitting: Urology

## 2014-11-02 DIAGNOSIS — R3914 Feeling of incomplete bladder emptying: Secondary | ICD-10-CM

## 2014-11-30 ENCOUNTER — Ambulatory Visit (INDEPENDENT_AMBULATORY_CARE_PROVIDER_SITE_OTHER): Payer: Medicare Other | Admitting: Urology

## 2014-11-30 DIAGNOSIS — N3941 Urge incontinence: Secondary | ICD-10-CM

## 2014-12-28 ENCOUNTER — Ambulatory Visit: Payer: Medicare Other | Admitting: Urology

## 2015-01-18 ENCOUNTER — Ambulatory Visit (INDEPENDENT_AMBULATORY_CARE_PROVIDER_SITE_OTHER): Payer: Medicare Other | Admitting: Urology

## 2015-01-18 DIAGNOSIS — N3941 Urge incontinence: Secondary | ICD-10-CM | POA: Diagnosis not present

## 2015-03-01 ENCOUNTER — Ambulatory Visit (INDEPENDENT_AMBULATORY_CARE_PROVIDER_SITE_OTHER): Payer: Medicare Other | Admitting: Urology

## 2015-03-01 DIAGNOSIS — N3941 Urge incontinence: Secondary | ICD-10-CM | POA: Diagnosis not present

## 2015-04-12 ENCOUNTER — Ambulatory Visit (INDEPENDENT_AMBULATORY_CARE_PROVIDER_SITE_OTHER): Payer: Medicare Other | Admitting: Urology

## 2015-04-12 DIAGNOSIS — R3914 Feeling of incomplete bladder emptying: Secondary | ICD-10-CM

## 2015-05-24 ENCOUNTER — Ambulatory Visit (INDEPENDENT_AMBULATORY_CARE_PROVIDER_SITE_OTHER): Payer: Medicare Other | Admitting: Urology

## 2015-05-24 DIAGNOSIS — R339 Retention of urine, unspecified: Secondary | ICD-10-CM

## 2015-07-05 ENCOUNTER — Ambulatory Visit (INDEPENDENT_AMBULATORY_CARE_PROVIDER_SITE_OTHER): Payer: Medicare Other | Admitting: Urology

## 2015-07-05 DIAGNOSIS — R339 Retention of urine, unspecified: Secondary | ICD-10-CM

## 2015-08-16 ENCOUNTER — Ambulatory Visit (INDEPENDENT_AMBULATORY_CARE_PROVIDER_SITE_OTHER): Payer: Medicare Other | Admitting: Urology

## 2015-08-16 DIAGNOSIS — R339 Retention of urine, unspecified: Secondary | ICD-10-CM | POA: Diagnosis not present

## 2015-09-13 ENCOUNTER — Ambulatory Visit (INDEPENDENT_AMBULATORY_CARE_PROVIDER_SITE_OTHER): Payer: Medicare Other | Admitting: Urology

## 2015-09-13 DIAGNOSIS — R339 Retention of urine, unspecified: Secondary | ICD-10-CM | POA: Diagnosis not present

## 2015-10-18 ENCOUNTER — Ambulatory Visit (INDEPENDENT_AMBULATORY_CARE_PROVIDER_SITE_OTHER): Payer: Medicare Other | Admitting: Urology

## 2015-10-18 DIAGNOSIS — R339 Retention of urine, unspecified: Secondary | ICD-10-CM

## 2015-11-22 ENCOUNTER — Ambulatory Visit (INDEPENDENT_AMBULATORY_CARE_PROVIDER_SITE_OTHER): Payer: Medicare Other | Admitting: Urology

## 2015-11-22 DIAGNOSIS — R339 Retention of urine, unspecified: Secondary | ICD-10-CM | POA: Diagnosis not present

## 2015-12-20 ENCOUNTER — Ambulatory Visit (INDEPENDENT_AMBULATORY_CARE_PROVIDER_SITE_OTHER): Payer: Medicare Other | Admitting: Urology

## 2015-12-20 DIAGNOSIS — B379 Candidiasis, unspecified: Secondary | ICD-10-CM

## 2015-12-20 DIAGNOSIS — R339 Retention of urine, unspecified: Secondary | ICD-10-CM

## 2016-01-17 ENCOUNTER — Ambulatory Visit (INDEPENDENT_AMBULATORY_CARE_PROVIDER_SITE_OTHER): Payer: Medicare Other | Admitting: Urology

## 2016-01-17 DIAGNOSIS — R339 Retention of urine, unspecified: Secondary | ICD-10-CM | POA: Diagnosis not present

## 2016-02-14 ENCOUNTER — Ambulatory Visit (INDEPENDENT_AMBULATORY_CARE_PROVIDER_SITE_OTHER): Payer: Medicare Other | Admitting: Urology

## 2016-02-14 DIAGNOSIS — N3941 Urge incontinence: Secondary | ICD-10-CM

## 2016-02-14 DIAGNOSIS — R339 Retention of urine, unspecified: Secondary | ICD-10-CM | POA: Diagnosis not present

## 2016-02-14 DIAGNOSIS — N401 Enlarged prostate with lower urinary tract symptoms: Secondary | ICD-10-CM | POA: Diagnosis not present

## 2016-03-18 ENCOUNTER — Emergency Department (HOSPITAL_COMMUNITY): Payer: Medicare Other

## 2016-03-18 ENCOUNTER — Inpatient Hospital Stay (HOSPITAL_COMMUNITY)
Admission: EM | Admit: 2016-03-18 | Discharge: 2016-03-21 | DRG: 698 | Disposition: A | Discharge status: Home Health | Payer: Medicare Other | Attending: Internal Medicine | Admitting: Internal Medicine

## 2016-03-18 ENCOUNTER — Encounter (HOSPITAL_COMMUNITY): Payer: Self-pay

## 2016-03-18 ENCOUNTER — Ambulatory Visit (INDEPENDENT_AMBULATORY_CARE_PROVIDER_SITE_OTHER): Payer: Medicare Other | Admitting: Urology

## 2016-03-18 DIAGNOSIS — R4182 Altered mental status, unspecified: Secondary | ICD-10-CM | POA: Diagnosis not present

## 2016-03-18 DIAGNOSIS — N183 Chronic kidney disease, stage 3 (moderate): Secondary | ICD-10-CM | POA: Diagnosis present

## 2016-03-18 DIAGNOSIS — R339 Retention of urine, unspecified: Secondary | ICD-10-CM

## 2016-03-18 DIAGNOSIS — D696 Thrombocytopenia, unspecified: Secondary | ICD-10-CM | POA: Diagnosis present

## 2016-03-18 DIAGNOSIS — N401 Enlarged prostate with lower urinary tract symptoms: Secondary | ICD-10-CM | POA: Diagnosis not present

## 2016-03-18 DIAGNOSIS — A4150 Gram-negative sepsis, unspecified: Secondary | ICD-10-CM | POA: Diagnosis not present

## 2016-03-18 DIAGNOSIS — Z7982 Long term (current) use of aspirin: Secondary | ICD-10-CM

## 2016-03-18 DIAGNOSIS — I5032 Chronic diastolic (congestive) heart failure: Secondary | ICD-10-CM | POA: Diagnosis present

## 2016-03-18 DIAGNOSIS — F0390 Unspecified dementia without behavioral disturbance: Secondary | ICD-10-CM | POA: Diagnosis present

## 2016-03-18 DIAGNOSIS — N400 Enlarged prostate without lower urinary tract symptoms: Secondary | ICD-10-CM | POA: Diagnosis present

## 2016-03-18 DIAGNOSIS — N179 Acute kidney failure, unspecified: Secondary | ICD-10-CM

## 2016-03-18 DIAGNOSIS — A4151 Sepsis due to Escherichia coli [E. coli]: Secondary | ICD-10-CM | POA: Diagnosis present

## 2016-03-18 DIAGNOSIS — I509 Heart failure, unspecified: Secondary | ICD-10-CM | POA: Diagnosis not present

## 2016-03-18 DIAGNOSIS — Z23 Encounter for immunization: Secondary | ICD-10-CM

## 2016-03-18 DIAGNOSIS — E876 Hypokalemia: Secondary | ICD-10-CM | POA: Diagnosis present

## 2016-03-18 DIAGNOSIS — G9349 Other encephalopathy: Secondary | ICD-10-CM | POA: Diagnosis present

## 2016-03-18 DIAGNOSIS — N390 Urinary tract infection, site not specified: Secondary | ICD-10-CM | POA: Diagnosis present

## 2016-03-18 DIAGNOSIS — I130 Hypertensive heart and chronic kidney disease with heart failure and stage 1 through stage 4 chronic kidney disease, or unspecified chronic kidney disease: Secondary | ICD-10-CM | POA: Diagnosis present

## 2016-03-18 DIAGNOSIS — Y846 Urinary catheterization as the cause of abnormal reaction of the patient, or of later complication, without mention of misadventure at the time of the procedure: Secondary | ICD-10-CM | POA: Diagnosis present

## 2016-03-18 DIAGNOSIS — I482 Chronic atrial fibrillation: Secondary | ICD-10-CM | POA: Diagnosis not present

## 2016-03-18 DIAGNOSIS — I4891 Unspecified atrial fibrillation: Secondary | ICD-10-CM | POA: Diagnosis present

## 2016-03-18 DIAGNOSIS — D72829 Elevated white blood cell count, unspecified: Secondary | ICD-10-CM

## 2016-03-18 DIAGNOSIS — N3001 Acute cystitis with hematuria: Secondary | ICD-10-CM

## 2016-03-18 DIAGNOSIS — R6520 Severe sepsis without septic shock: Secondary | ICD-10-CM | POA: Diagnosis present

## 2016-03-18 DIAGNOSIS — Z7951 Long term (current) use of inhaled steroids: Secondary | ICD-10-CM

## 2016-03-18 DIAGNOSIS — Z87891 Personal history of nicotine dependence: Secondary | ICD-10-CM | POA: Diagnosis not present

## 2016-03-18 DIAGNOSIS — A419 Sepsis, unspecified organism: Secondary | ICD-10-CM

## 2016-03-18 DIAGNOSIS — T83511A Infection and inflammatory reaction due to indwelling urethral catheter, initial encounter: Secondary | ICD-10-CM | POA: Diagnosis not present

## 2016-03-18 DIAGNOSIS — N3941 Urge incontinence: Secondary | ICD-10-CM

## 2016-03-18 LAB — COMPREHENSIVE METABOLIC PANEL
ALT: <5 U/L — ABNORMAL LOW (ref 17–63)
ANION GAP: 18 — AB (ref 5–15)
AST: 63 U/L — ABNORMAL HIGH (ref 15–41)
Albumin: 4 g/dL (ref 3.5–5.0)
Alkaline Phosphatase: 46 U/L (ref 38–126)
BILIRUBIN TOTAL: 1.6 mg/dL — AB (ref 0.3–1.2)
BUN: 32 mg/dL — ABNORMAL HIGH (ref 6–20)
CHLORIDE: 100 mmol/L — AB (ref 101–111)
CO2: 21 mmol/L — ABNORMAL LOW (ref 22–32)
Calcium: 9.3 mg/dL (ref 8.9–10.3)
Creatinine, Ser: 2.03 mg/dL — ABNORMAL HIGH (ref 0.61–1.24)
GFR, EST AFRICAN AMERICAN: 32 mL/min — AB (ref >60)
GFR, EST NON AFRICAN AMERICAN: 28 mL/min — AB (ref >60)
Glucose, Bld: 180 mg/dL — ABNORMAL HIGH (ref 65–99)
Potassium: 3.3 mmol/L — ABNORMAL LOW (ref 3.5–5.1)
Sodium: 139 mmol/L (ref 135–145)
TOTAL PROTEIN: 6.9 g/dL (ref 6.5–8.1)

## 2016-03-18 LAB — CBC WITH DIFFERENTIAL/PLATELET
BASOS ABS: 0 10*3/uL (ref 0.0–0.1)
Basophils Relative: 0 %
EOS PCT: 0 %
Eosinophils Absolute: 0 10*3/uL (ref 0.0–0.7)
HEMATOCRIT: 47.1 % (ref 39.0–52.0)
HEMOGLOBIN: 15.4 g/dL (ref 13.0–17.0)
LYMPHS ABS: 0.3 10*3/uL — AB (ref 0.7–4.0)
LYMPHS PCT: 2 %
MCH: 30.1 pg (ref 26.0–34.0)
MCHC: 32.7 g/dL (ref 30.0–36.0)
MCV: 92.2 fL (ref 78.0–100.0)
Monocytes Absolute: 0.4 10*3/uL (ref 0.1–1.0)
Monocytes Relative: 2 %
NEUTROS ABS: 14.3 10*3/uL — AB (ref 1.7–7.7)
NEUTROS PCT: 96 %
Platelets: 115 10*3/uL — ABNORMAL LOW (ref 150–400)
RBC: 5.11 MIL/uL (ref 4.22–5.81)
RDW: 13.6 % (ref 11.5–15.5)
SMEAR REVIEW: ADEQUATE
WBC: 15 10*3/uL — AB (ref 4.0–10.5)

## 2016-03-18 LAB — I-STAT CHEM 8, ED
BUN: 33 mg/dL — AB (ref 6–20)
CREATININE: 1.8 mg/dL — AB (ref 0.61–1.24)
Calcium, Ion: 1.07 mmol/L — ABNORMAL LOW (ref 1.13–1.30)
Chloride: 100 mmol/L — ABNORMAL LOW (ref 101–111)
GLUCOSE: 173 mg/dL — AB (ref 65–99)
HEMATOCRIT: 49 % (ref 39.0–52.0)
Hemoglobin: 16.7 g/dL (ref 13.0–17.0)
POTASSIUM: 3.3 mmol/L — AB (ref 3.5–5.1)
Sodium: 139 mmol/L (ref 135–145)
TCO2: 19 mmol/L (ref 0–100)

## 2016-03-18 LAB — PROCALCITONIN: Procalcitonin: 41.58 ng/mL

## 2016-03-18 LAB — PROTIME-INR
INR: 1.63 — ABNORMAL HIGH (ref 0.00–1.49)
Prothrombin Time: 19.3 seconds — ABNORMAL HIGH (ref 11.6–15.2)

## 2016-03-18 LAB — LACTIC ACID, PLASMA: Lactic Acid, Venous: 8.1 mmol/L (ref 0.5–2.0)

## 2016-03-18 LAB — APTT: aPTT: 38 seconds — ABNORMAL HIGH (ref 24–37)

## 2016-03-18 LAB — BRAIN NATRIURETIC PEPTIDE: B Natriuretic Peptide: 388 pg/mL — ABNORMAL HIGH (ref 0.0–100.0)

## 2016-03-18 LAB — I-STAT CG4 LACTIC ACID, ED
LACTIC ACID, VENOUS: 8.75 mmol/L — AB (ref 0.5–2.0)
LACTIC ACID, VENOUS: 5.22 mmol/L — AB (ref 0.5–2.0)

## 2016-03-18 MED ORDER — DEXTROSE 5 % IV SOLN
1.0000 g | Freq: Once | INTRAVENOUS | Status: DC
  Filled 2016-03-18: qty 10

## 2016-03-18 MED ORDER — ACETAMINOPHEN 650 MG RE SUPP: 650.0000 mg | Freq: Four times a day (QID) | RECTAL | Status: DC | PRN

## 2016-03-18 MED ORDER — ALBUTEROL SULFATE (2.5 MG/3ML) 0.083% IN NEBU: 2.5000 mg | INHALATION_SOLUTION | RESPIRATORY_TRACT | Status: DC | PRN

## 2016-03-18 MED ORDER — PIPERACILLIN-TAZOBACTAM 3.375 G IVPB 30 MIN: 3.3750 g | Freq: Once | INTRAVENOUS | Status: DC

## 2016-03-18 MED ORDER — ONDANSETRON HCL 4 MG PO TABS: 4.0000 mg | ORAL_TABLET | Freq: Four times a day (QID) | ORAL | Status: DC | PRN

## 2016-03-18 MED ORDER — SODIUM CHLORIDE 0.9 % IV SOLN
1.0000 g | Freq: Once | INTRAVENOUS | Status: AC
  Administered 2016-03-18: 1 g via INTRAVENOUS
  Filled 2016-03-18: qty 10

## 2016-03-18 MED ORDER — SODIUM CHLORIDE 0.9 % IV BOLUS (SEPSIS)
1000.0000 mL | Freq: Once | INTRAVENOUS | Status: AC
  Administered 2016-03-18: 1000 mL via INTRAVENOUS

## 2016-03-18 MED ORDER — ONDANSETRON HCL 4 MG/2ML IJ SOLN
4.0000 mg | Freq: Four times a day (QID) | INTRAMUSCULAR | Status: DC | PRN
Start: 2016-03-18 — End: 2016-03-21
  Administered 2016-03-20 – 2016-03-21 (×2): 4 mg via INTRAVENOUS
  Filled 2016-03-18 (×2): qty 2

## 2016-03-18 MED ORDER — SODIUM CHLORIDE 0.9% FLUSH
3.0000 mL | Freq: Two times a day (BID) | INTRAVENOUS | Status: DC
  Administered 2016-03-19 – 2016-03-21 (×4): 3 mL via INTRAVENOUS

## 2016-03-18 MED ORDER — VANCOMYCIN HCL IN DEXTROSE 1-5 GM/200ML-% IV SOLN: 1000.0000 mg | Freq: Once | INTRAVENOUS | Status: DC

## 2016-03-18 MED ORDER — PIPERACILLIN-TAZOBACTAM 3.375 G IVPB
3.3750 g | Freq: Three times a day (TID) | INTRAVENOUS | Status: DC
  Administered 2016-03-19 – 2016-03-21 (×8): 3.375 g via INTRAVENOUS
  Filled 2016-03-18 (×6): qty 50

## 2016-03-18 MED ORDER — PIPERACILLIN-TAZOBACTAM 3.375 G IVPB 30 MIN
3.3750 g | Freq: Once | INTRAVENOUS | Status: AC
  Administered 2016-03-18: 3.375 g via INTRAVENOUS
  Filled 2016-03-18: qty 50

## 2016-03-18 MED ORDER — ACETAMINOPHEN 325 MG PO TABS
650.0000 mg | ORAL_TABLET | Freq: Four times a day (QID) | ORAL | Status: DC | PRN
  Administered 2016-03-19 – 2016-03-21 (×3): 650 mg via ORAL
  Filled 2016-03-18 (×3): qty 2

## 2016-03-18 MED ORDER — IPRATROPIUM BROMIDE 0.02 % IN SOLN: 0.5000 mg | RESPIRATORY_TRACT | Status: DC | PRN

## 2016-03-18 MED ORDER — POTASSIUM CHLORIDE 10 MEQ/100ML IV SOLN
10.0000 meq | INTRAVENOUS | Status: AC
  Administered 2016-03-19 (×4): 10 meq via INTRAVENOUS
  Filled 2016-03-18 (×4): qty 100

## 2016-03-18 MED ORDER — VANCOMYCIN HCL IN DEXTROSE 750-5 MG/150ML-% IV SOLN
750.0000 mg | INTRAVENOUS | Status: DC
  Filled 2016-03-18: qty 150

## 2016-03-18 MED ORDER — ACETAMINOPHEN 650 MG RE SUPP
RECTAL | Status: AC
  Filled 2016-03-18: qty 1

## 2016-03-18 MED ORDER — IPRATROPIUM-ALBUTEROL 0.5-2.5 (3) MG/3ML IN SOLN: 3.0000 mL | RESPIRATORY_TRACT | Status: DC | PRN

## 2016-03-18 MED ORDER — SODIUM CHLORIDE 0.9 % IV BOLUS (SEPSIS)
2000.0000 mL | Freq: Once | INTRAVENOUS | Status: AC
  Administered 2016-03-18: 2000 mL via INTRAVENOUS

## 2016-03-18 MED ORDER — SODIUM CHLORIDE 0.9 % IV SOLN
INTRAVENOUS | Status: DC
  Administered 2016-03-18 – 2016-03-19 (×2): via INTRAVENOUS

## 2016-03-18 MED ORDER — ACETAMINOPHEN 650 MG RE SUPP
650.0000 mg | Freq: Once | RECTAL | Status: AC
  Administered 2016-03-18: 650 mg via RECTAL

## 2016-03-18 MED ORDER — VANCOMYCIN HCL IN DEXTROSE 1-5 GM/200ML-% IV SOLN
1000.0000 mg | Freq: Once | INTRAVENOUS | Status: AC
  Administered 2016-03-18: 1000 mg via INTRAVENOUS
  Filled 2016-03-18: qty 200

## 2016-03-18 MED ORDER — ALBUTEROL SULFATE (2.5 MG/3ML) 0.083% IN NEBU
5.0000 mg | INHALATION_SOLUTION | Freq: Once | RESPIRATORY_TRACT | Status: AC
  Administered 2016-03-18: 5 mg via RESPIRATORY_TRACT
  Filled 2016-03-18: qty 6

## 2016-03-18 MED ORDER — HEPARIN SODIUM (PORCINE) 5000 UNIT/ML IJ SOLN: 5000.0000 [IU] | Freq: Three times a day (TID) | INTRAMUSCULAR | Status: DC

## 2016-03-18 NOTE — ED Notes (Signed)
Patient 85% on RA. Patient placed on 2lpm of oxygen sats increased to 93%.

## 2016-03-18 NOTE — ED Notes (Signed)
MD at bedside. 

## 2016-03-18 NOTE — ED Notes (Signed)
Dr. Zammit made aware of patient. 

## 2016-03-18 NOTE — ED Notes (Addendum)
Per EMS patient lives by himself at home, neighbors take care of patient. Patient was found in floor and short of breath. PAtient had suprapubic catheter changed today before lunch and sitter noticed blood in urine bag. Gross amount of blood noted in foley bag.  Blood noted to be coming out of penis. Patient denies pain. Temp of 103.9 rectal.

## 2016-03-18 NOTE — H&P (Addendum)
History and Physical    Vincent [name]:[id number] DOB: 17-Sep-1928 DOA: 03/18/2016  Referring MD/NP/PA: Dr. Estell Harpin PCP: Samuel Jester, DO  Outpatient Specialists: unknown Patient coming from: Home lives alone  Chief Complaint: Found down  HPI: Vincent [name] is a 82 y.o. male with medical history significant of  atrial fibrillation, CHF, dementia, BPH, status post suprapubic catheter; who had reportedly went to go get his suprapubic catheter replaced today but was found down confused on the floor with blood in his Foley bag. Unable to obtain history from patient at this time as he is confused. He is reported to live alone and is checked by a neighbor next door.  ED Course:   Upon admission into the emergency department patient was seen have temperature up to 103.67F, pulse within normal limits  respirations 34, blood pressure as low as 92/54, O2 saturations maintained above 92 on nasal cannula oxygen. Initial lab work revealed a WBC 15, platelets 115. EKG revealed in atrial fibrillation.   Review of Systems: Unable to obtain secondary to confusion   Past Medical History  Diagnosis Date  . Urine retention   . Atrial fibrillation (HCC)   . Lumbar spondylosis   . Prostate hypertrophy   . Arachnoid cyst   . Hypertension   . CHF (congestive heart failure) (HCC)   . UTI (lower urinary tract infection)   . Sepsis (HCC)   . Dementia   . Urine retention     Past Surgical History  Procedure Laterality Date  . Suprapubic catheter placement    . Transurethral resection of prostate    . Renal abscess drainage       reports that he has quit smoking. His smoking use included Cigarettes. He has never used smokeless tobacco. He reports that he does not drink alcohol or use illicit drugs.  No Known Allergies  Family History  Problem Relation Age of Onset  . Family history unknown: Yes    Prior to Admission medications   Medication Sig Start Date End Date Taking?  Authorizing Provider  albuterol (PROVENTIL HFA;VENTOLIN HFA) 108 (90 BASE) MCG/ACT inhaler Inhale 2 puffs into the lungs every 6 (six) hours as needed. For asthma   Yes Historical Provider, MD  aspirin 81 MG tablet Take 81 mg by mouth daily.   Yes Historical Provider, MD  finasteride (PROSCAR) 5 MG tablet Take 5 mg by mouth daily.   Yes Historical Provider, MD  furosemide (LASIX) 40 MG tablet Take 1 tablet (40 mg total) by mouth daily. 10/10/13  Yes Renae Fickle, MD  oxybutynin (DITROPAN) 5 MG tablet Take 5 mg by mouth 2 (two) times daily.   Yes Historical Provider, MD  oxyCODONE-acetaminophen (PERCOCET/ROXICET) 5-325 MG per tablet Take 1 tablet by mouth every 4 (four) hours as needed. Pain   Yes Historical Provider, MD  simvastatin (ZOCOR) 20 MG tablet Take 1 tablet (20 mg total) by mouth every evening. 07/29/13  Yes Erick Blinks, MD  tamsulosin (FLOMAX) 0.4 MG CAPS Take 0.4 mg by mouth daily.   Yes Historical Provider, MD  budesonide-formoterol (SYMBICORT) 80-4.5 MCG/ACT inhaler Inhale 2 puffs into the lungs 2 (two) times daily as needed. Shortness of Breath     Historical Provider, MD  potassium chloride 20 MEQ TBCR Take 20 mEq by mouth daily. 10/10/13   Renae Fickle, MD  sennosides-docusate sodium (SENOKOT-S) 8.6-50 MG tablet Take 1 tablet by mouth daily as needed for constipation.    Historical Provider, MD    Physical Exam: Ceasar Mons  Vitals:   03/18/16 2031 03/18/16 2045 03/18/16 2100 03/18/16 2102  BP:   115/68 115/68  Pulse:  74 73 70  Temp:      TempSrc:      Resp:  30 33 32  Height:      Weight:      SpO2: 95% 93% 92% 92%      Constitutional:Elderly male who appears sick and is confused and drifts to sleep within seconds of waking up. Filed Vitals:   03/18/16 2031 03/18/16 2045 03/18/16 2100 03/18/16 2102  BP:   115/68 115/68  Pulse:  74 73 70  Temp:      TempSrc:      Resp:  30 33 32  Height:      Weight:      SpO2: 95% 93% 92% 92%   Eyes: PERRL, lids and  conjunctivae normal ENMT: Mucous membranes are dry. Posterior pharynx clear of any exudate or lesions.Normal dentition.  Neck: normal, supple, no masses, no thyromegaly Respiratory: clear to auscultation bilaterally, no wheezing, no crackles. Normal respiratory effort. No accessory muscle use.  Cardiovascular: Irregular rate and rhythm,trace lower extremity edema. 2+ pedal pulses. No carotid bruits.  Abdomen: no tenderness, no masses palpated. No hepatosplenomegaly. Bowel sounds positive.  Musculoskeletal: no clubbing / cyanosis. No joint deformity upper and lower extremities. Good ROM, no contractures. Normal muscle tone.  Genitourinary: Suprapubic catheter present with blood urine draining in Foley bag. Skin: blood surround suprapubic catheter. No lesions, ulcers. No induration Neurologic: CN 2-12 grossly intact. Sensation intact, DTR normal. Strength 5/5 in all 4.  Psychiatric: lethargic and unable to assess memory or mood.   Labs on Admission: I have personally reviewed following labs and imaging studies  CBC:  Recent Labs Lab 03/18/16 1953 03/18/16 1958  WBC 15.0*  --   NEUTROABS 14.3*  --   HGB 15.4 16.7  HCT 47.1 49.0  MCV 92.2  --   PLT 115*  --    Basic Metabolic Panel:  Recent Labs Lab 03/18/16 1953 03/18/16 1958  NA 139 139  K 3.3* 3.3*  CL 100* 100*  CO2 21*  --   GLUCOSE 180* 173*  BUN 32* 33*  CREATININE 2.03* 1.80*  CALCIUM 9.3  --    GFR: Estimated Creatinine Clearance: 28.5 mL/min (by C-G formula based on Cr of 1.8). Liver Function Tests:  Recent Labs Lab 03/18/16 1953  AST 63*  ALT <5*  ALKPHOS 46  BILITOT 1.6*  PROT 6.9  ALBUMIN 4.0   No results for input(s): LIPASE, AMYLASE in the last 168 hours. No results for input(s): AMMONIA in the last 168 hours. Coagulation Profile: No results for input(s): INR, PROTIME in the last 168 hours. Cardiac Enzymes: No results for input(s): CKTOTAL, CKMB, CKMBINDEX, TROPONINI in the last 168 hours. BNP  (last 3 results) No results for input(s): PROBNP in the last 8760 hours. HbA1C: No results for input(s): HGBA1C in the last 72 hours. CBG: No results for input(s): GLUCAP in the last 168 hours. Lipid Profile: No results for input(s): CHOL, HDL, LDLCALC, TRIG, CHOLHDL, LDLDIRECT in the last 72 hours. Thyroid Function Tests: No results for input(s): TSH, T4TOTAL, FREET4, T3FREE, THYROIDAB in the last 72 hours. Anemia Panel: No results for input(s): VITAMINB12, FOLATE, FERRITIN, TIBC, IRON, RETICCTPCT in the last 72 hours. Urine analysis:    Component Value Date/Time   COLORURINE YELLOW 10/01/2014 2200   APPEARANCEUR CLEAR 10/01/2014 2200   LABSPEC 1.025 10/01/2014 2200   PHURINE 5.0 10/01/2014 2200  GLUCOSEU NEGATIVE 10/01/2014 2200   HGBUR LARGE* 10/01/2014 2200   BILIRUBINUR NEGATIVE 10/01/2014 2200   KETONESUR NEGATIVE 10/01/2014 2200   PROTEINUR 100* 10/01/2014 2200   UROBILINOGEN 0.2 10/01/2014 2200   NITRITE NEGATIVE 10/01/2014 2200   LEUKOCYTESUR TRACE* 10/01/2014 2200   Sepsis Labs: @LABRCNTIP (procalcitonin:4,lacticidven:4) ) Recent Results (from the past 240 hour(s))  Blood Culture (routine x 2)     Status: None (Preliminary result)   Collection Time: 03/18/16  7:53 PM  Result Value Ref Range Status   Specimen Description BLOOD RIGHT HAND  Final   Special Requests BOTTLES DRAWN AEROBIC AND ANAEROBIC 6CC  Final   Culture PENDING  Incomplete   Report Status PENDING  Incomplete  Blood Culture (routine x 2)     Status: None (Preliminary result)   Collection Time: 03/18/16  8:01 PM  Result Value Ref Range Status   Specimen Description BLOOD LEFT HAND  Final   Special Requests BOTTLES DRAWN AEROBIC AND ANAEROBIC 6CC  Final   Culture PENDING  Incomplete   Report Status PENDING  Incomplete     Radiological Exams on Admission: Dg Chest Portable 1 View  03/18/2016  CLINICAL DATA:  Code sepsis. EXAM: PORTABLE CHEST 1 VIEW COMPARISON:  02/21/2014; 01/02/2014; 10/09/2013;  chest CT - 10/09/2013 FINDINGS: Grossly unchanged enlarged cardiac silhouette and mediastinal contours with atherosclerotic plaque within the thoracic aorta. Pulmonary vasculature is less distinct than present examination with cephalization of flow. Perihilar heterogeneous opacities are favored to represent atelectasis. No discrete focal airspace opacities. No pleural effusion or pneumothorax. No acute osseous abnormalities. Suspected loose bodies overlying the anterior medial aspect of the left glenohumeral joint. IMPRESSION: Cardiomegaly with suspected superimposed pulmonary edema on this AP portable examination. No discrete focal airspace opacities to suggest pneumonia. Electronically Signed   By: Simonne Come M.D.   On: 03/18/2016 20:07    EKG: Independently reviewed. Atrial fibrillation  Assessment/Plan Severe Sepsis secondary to suspected urinary tract infection with hematuria: Acute. Patient with elevated lactic acid level 8.75, WBC elevated at 15, UA - Admitted to [facility] stepdown/ICU  - sepsis protocol initiated with fluid bolus   - Follow-up blood and urine cultures - Empiric antibiotics of vancomycin and Zosyn de-escalate when able - Trend lactic acid levels - Bladder irrigation prn for blockages/clots - Will likely need urology evaluation, but currently holding stepdown beds that [facility] and [facility]  Acute encephalopathy secondary to infection - Nursing bedside swallow patient passes, place on a heart healthy diet.  Otherwise, if patient fails please schedule speech therapy to eval and treat. - Continue to monitor neuro status   Acute kidney injury on chronic kidney disease stage III: Patient's previous baseline creatinine was noted to be around 1.3 -1.6 previously. Patient acutely found to have a creatinine of 2.03 on admission with a BUN of 33. - Follow-up repeat BMP in a.m.  Hypokalemia: Acute. Initial potassium was noted to be 3.3 on admission - 40 mEq IV potassium  chloride - Replace as needed  Hypocalcemia: Ionized calcium seen to be 1.07 on admission. -  Given 1 g of calcium gluconate IV  CHF: bnp 388 on admission - gentle fluids - strict in and outs  Atrial fibrillation: chronic. Patient rate controlled and does no  - not a good anticoagulation candidate   Will need to reconcile medication list   DVT prophylaxis: SCDs Code Status: full Family Communication: None Disposition Plan: undetermined Consults called: None Admission status: Inpatient SDU  Clydie Braun MD [facility]  Pager [phone]  If 7PM-7AM, please contact night-coverage [email or web address] Password [email or web address]  03/18/2016, 9:18 PM

## 2016-03-18 NOTE — ED Notes (Addendum)
Vincent [name]- patients family would like to be called if an emergency. Phone number is [phone].

## 2016-03-18 NOTE — Progress Notes (Signed)
Pharmacy Antibiotic Note  Vincent BeathRobert W [name] is a 82 y.o. male admitted on 03/18/2016 with sepsis.  Pharmacy has been consulted for Vancomycin and Zosyn dosing.  Plan: Vancomycin 750 mg IV every 24 hours.  Goal trough 10-15 mcg/mL. Zosyn 3.375g IV q8h (4 hour infusion) Monitor labs, micro and vitals.   Height: 5\' 8"  (172.7 cm) Weight: 170 lb (77.111 kg) IBW/kg (Calculated) : 68.4  Temp (24hrs), Avg:103.9 F (39.9 C), Min:103.9 F (39.9 C), Max:103.9 F (39.9 C)   Recent Labs Lab 03/18/16 1953 03/18/16 1958  WBC 15.0*  --   CREATININE 2.03* 1.80*  LATICACIDVEN  --  8.75*    Estimated Creatinine Clearance: 28.5 mL/min (by C-G formula based on Cr of 1.8).    No Known Allergies  Antimicrobials this admission: Vancomycin 5/3 >>  Zosyn 5/3 >>   Dose adjustments this admission: n/a  Microbiology results: 5/3 BCx:   Thank you for allowing pharmacy to be a part of this patient's care.  Vincent [name], Vincent [name] 03/18/2016 9:36 PM

## 2016-03-18 NOTE — ED Provider Notes (Signed)
CSN: [id number]     Arrival date & time 03/18/16  1917 History   First MD Initiated Contact with Patient 03/18/16 1940     Chief Complaint  Patient presents with  . Shortness of Breath     (Consider location/radiation/quality/duration/timing/severity/associated sxs/prior Treatment) Patient is a 82 y.o. male presenting with altered mental status. The history is provided by a relative (Patient went to get his suprapubic catheter replaced today. He went home and was found later by a family member to be confused than on the floor there was blood in his Foley bag).  Altered Mental Status Presenting symptoms: confusion   Severity:  Moderate Most recent episode:  Today Episode history:  Continuous Timing:  Constant Progression:  Unchanged Chronicity:  New Context: not alcohol use     Past Medical History  Diagnosis Date  . Urine retention   . Atrial fibrillation (HCC)   . Lumbar spondylosis   . Prostate hypertrophy   . Arachnoid cyst   . Hypertension   . CHF (congestive heart failure) (HCC)   . UTI (lower urinary tract infection)   . Sepsis (HCC)   . Dementia   . Urine retention    Past Surgical History  Procedure Laterality Date  . Suprapubic catheter placement    . Transurethral resection of prostate    . Renal abscess drainage     Family History  Problem Relation Age of Onset  . Family history unknown: Yes   Social History  Substance Use Topics  . Smoking status: Former Smoker    Types: Cigarettes  . Smokeless tobacco: Never Used  . Alcohol Use: No    Review of Systems  Unable to perform ROS: Mental status change  Psychiatric/Behavioral: Positive for confusion.      Allergies  Review of patient's allergies indicates no known allergies.  Home Medications   Prior to Admission medications   Medication Sig Start Date End Date Taking? Authorizing Provider  albuterol (PROVENTIL HFA;VENTOLIN HFA) 108 (90 BASE) MCG/ACT inhaler Inhale 2 puffs into the lungs every  6 (six) hours as needed. For asthma   Yes Historical Provider, MD  aspirin 81 MG tablet Take 81 mg by mouth daily.   Yes Historical Provider, MD  finasteride (PROSCAR) 5 MG tablet Take 5 mg by mouth daily.   Yes Historical Provider, MD  furosemide (LASIX) 40 MG tablet Take 1 tablet (40 mg total) by mouth daily. 10/10/13  Yes Renae Fickle, MD  oxybutynin (DITROPAN) 5 MG tablet Take 5 mg by mouth 2 (two) times daily.   Yes Historical Provider, MD  oxyCODONE-acetaminophen (PERCOCET/ROXICET) 5-325 MG per tablet Take 1 tablet by mouth every 4 (four) hours as needed. Pain   Yes Historical Provider, MD  simvastatin (ZOCOR) 20 MG tablet Take 1 tablet (20 mg total) by mouth every evening. 07/29/13  Yes Erick Blinks, MD  tamsulosin (FLOMAX) 0.4 MG CAPS Take 0.4 mg by mouth daily.   Yes Historical Provider, MD  budesonide-formoterol (SYMBICORT) 80-4.5 MCG/ACT inhaler Inhale 2 puffs into the lungs 2 (two) times daily as needed. Shortness of Breath     Historical Provider, MD  potassium chloride 20 MEQ TBCR Take 20 mEq by mouth daily. 10/10/13   Renae Fickle, MD  sennosides-docusate sodium (SENOKOT-S) 8.6-50 MG tablet Take 1 tablet by mouth daily as needed for constipation.    Historical Provider, MD   BP 103/71 mmHg  Pulse 65  Temp(Src) 103.9 F (39.9 C) (Rectal)  Resp 32  Ht 5\' 8"  (1.727 m)  Wt 170 lb (77.111 kg)  BMI 25.85 kg/m2  SpO2 93% Physical Exam  Constitutional: He is oriented to person, place, and time. He appears well-developed.  HENT:  Head: Normocephalic.  Eyes: Conjunctivae and EOM are normal. No scleral icterus.  Neck: Neck supple. No thyromegaly present.  Cardiovascular: Normal rate and regular rhythm.  Exam reveals no gallop and no friction rub.   No murmur heard. Pulmonary/Chest: No stridor. He has no wheezes. He has no rales. He exhibits no tenderness.  Patient mildly  Abdominal: He exhibits no distension. There is no tenderness. There is no rebound.  Patient with  suprapubic catheter with  blood in it  Musculoskeletal: Normal range of motion. He exhibits no edema.  Lymphadenopathy:    He has no cervical adenopathy.  Neurological: He is oriented to person, place, and time. He exhibits normal muscle tone. Coordination normal.  Skin: No rash noted. No erythema.    ED Course  Procedures (including critical care time) Labs Review Labs Reviewed  COMPREHENSIVE METABOLIC PANEL - Abnormal; Notable for the following:    Potassium 3.3 (*)    Chloride 100 (*)    CO2 21 (*)    Glucose, Bld 180 (*)    BUN 32 (*)    Creatinine, Ser 2.03 (*)    AST 63 (*)    ALT <5 (*)    Total Bilirubin 1.6 (*)    GFR calc non Af Amer 28 (*)    GFR calc Af Amer 32 (*)    Anion gap 18 (*)    All other components within normal limits  CBC WITH DIFFERENTIAL/PLATELET - Abnormal; Notable for the following:    WBC 15.0 (*)    Platelets 115 (*)    Neutro Abs 14.3 (*)    Lymphs Abs 0.3 (*)    All other components within normal limits  BRAIN NATRIURETIC PEPTIDE - Abnormal; Notable for the following:    B Natriuretic Peptide 388.0 (*)    All other components within normal limits  I-STAT CG4 LACTIC ACID, ED - Abnormal; Notable for the following:    Lactic Acid, Venous 8.75 (*)    All other components within normal limits  I-STAT CHEM 8, ED - Abnormal; Notable for the following:    Potassium 3.3 (*)    Chloride 100 (*)    BUN 33 (*)    Creatinine, Ser 1.80 (*)    Glucose, Bld 173 (*)    Calcium, Ion 1.07 (*)    All other components within normal limits  CULTURE, BLOOD (ROUTINE X 2)  CULTURE, BLOOD (ROUTINE X 2)  URINE CULTURE  URINALYSIS, ROUTINE W REFLEX MICROSCOPIC (NOT AT [facility])  LACTIC ACID, PLASMA  LACTIC ACID, PLASMA  PROCALCITONIN  APTT  CBC  COMPREHENSIVE METABOLIC PANEL  PROTIME-INR    Imaging Review Dg Chest Portable 1 View  03/18/2016  CLINICAL DATA:  Code sepsis. EXAM: PORTABLE CHEST 1 VIEW COMPARISON:  02/21/2014; 01/02/2014; 10/09/2013; chest CT -  10/09/2013 FINDINGS: Grossly unchanged enlarged cardiac silhouette and mediastinal contours with atherosclerotic plaque within the thoracic aorta. Pulmonary vasculature is less distinct than present examination with cephalization of flow. Perihilar heterogeneous opacities are favored to represent atelectasis. No discrete focal airspace opacities. No pleural effusion or pneumothorax. No acute osseous abnormalities. Suspected loose bodies overlying the anterior medial aspect of the left glenohumeral joint. IMPRESSION: Cardiomegaly with suspected superimposed pulmonary edema on this AP portable examination. No discrete focal airspace opacities to suggest pneumonia. Electronically Signed   By: Jonny Ruiz  Watts M.D.   On: 03/18/2016 20:07   I have personally reviewed and evaluated these images and lab results as part of my medical decision-making.   EKG Interpretation None    The suprapubic catheter was not draining appropriately so will be replaced CRITICAL CARE Performed by: Glorious Flicker L Total critical care time: 40 minutes Critical care time was exclusive of separately billable procedures and treating other patients. Critical care was necessary to treat or prevent imminent or life-threatening deterioration. Critical care was time spent personally by me on the following activities: development of treatment plan with patient and/or surrogate as well as nursing, discussions with consultants, evaluation of patient's response to treatment, examination of patient, obtaining history from patient or surrogate, ordering and performing treatments and interventions, ordering and review of laboratory studies, ordering and review of radiographic studies, pulse oximetry and re-evaluation of patient's condition.  MDM   Final diagnoses:  None   Patient septic possibly from urine. Patient is given antibiotics and fluids and will be admitted icu     Bethann BerkshireJoseph Azaylia Fong, MD 03/18/16 2134

## 2016-03-19 DIAGNOSIS — A4150 Gram-negative sepsis, unspecified: Secondary | ICD-10-CM

## 2016-03-19 DIAGNOSIS — N390 Urinary tract infection, site not specified: Secondary | ICD-10-CM | POA: Diagnosis present

## 2016-03-19 DIAGNOSIS — D696 Thrombocytopenia, unspecified: Secondary | ICD-10-CM | POA: Diagnosis present

## 2016-03-19 LAB — BASIC METABOLIC PANEL
ANION GAP: 11 (ref 5–15)
BUN: 28 mg/dL — ABNORMAL HIGH (ref 6–20)
CO2: 23 mmol/L (ref 22–32)
Calcium: 8.5 mg/dL — ABNORMAL LOW (ref 8.9–10.3)
Chloride: 107 mmol/L (ref 101–111)
Creatinine, Ser: 1.26 mg/dL — ABNORMAL HIGH (ref 0.61–1.24)
GFR calc Af Amer: 58 mL/min — ABNORMAL LOW (ref >60)
GFR calc non Af Amer: 50 mL/min — ABNORMAL LOW (ref >60)
GLUCOSE: 128 mg/dL — AB (ref 65–99)
POTASSIUM: 4.4 mmol/L (ref 3.5–5.1)
Sodium: 141 mmol/L (ref 135–145)

## 2016-03-19 LAB — URINALYSIS, ROUTINE W REFLEX MICROSCOPIC
BILIRUBIN URINE: NEGATIVE
Glucose, UA: 100 mg/dL — AB
KETONES UR: 15 mg/dL — AB
NITRITE: POSITIVE — AB
PH: 6.5 (ref 5.0–8.0)
Protein, ur: >300 mg/dL — AB
SPECIFIC GRAVITY, URINE: 1.025 (ref 1.005–1.030)

## 2016-03-19 LAB — COMPREHENSIVE METABOLIC PANEL
ALK PHOS: 34 U/L — AB (ref 38–126)
ALT: 34 U/L (ref 17–63)
AST: 47 U/L — ABNORMAL HIGH (ref 15–41)
Albumin: 3.2 g/dL — ABNORMAL LOW (ref 3.5–5.0)
Anion gap: 11 (ref 5–15)
BILIRUBIN TOTAL: 1.4 mg/dL — AB (ref 0.3–1.2)
BUN: 28 mg/dL — ABNORMAL HIGH (ref 6–20)
CALCIUM: 8.3 mg/dL — AB (ref 8.9–10.3)
CO2: 22 mmol/L (ref 22–32)
CREATININE: 1.43 mg/dL — AB (ref 0.61–1.24)
Chloride: 107 mmol/L (ref 101–111)
GFR calc non Af Amer: 43 mL/min — ABNORMAL LOW (ref >60)
GFR, EST AFRICAN AMERICAN: 50 mL/min — AB (ref >60)
Glucose, Bld: 157 mg/dL — ABNORMAL HIGH (ref 65–99)
Potassium: 4.5 mmol/L (ref 3.5–5.1)
SODIUM: 140 mmol/L (ref 135–145)
TOTAL PROTEIN: 5.8 g/dL — AB (ref 6.5–8.1)

## 2016-03-19 LAB — URINE MICROSCOPIC-ADD ON: SQUAMOUS EPITHELIAL / LPF: NONE SEEN

## 2016-03-19 LAB — CBC
HCT: 44.3 % (ref 39.0–52.0)
HEMATOCRIT: 42.1 % (ref 39.0–52.0)
HEMOGLOBIN: 14 g/dL (ref 13.0–17.0)
Hemoglobin: 14.9 g/dL (ref 13.0–17.0)
MCH: 30.7 pg (ref 26.0–34.0)
MCH: 31 pg (ref 26.0–34.0)
MCHC: 33.3 g/dL (ref 30.0–36.0)
MCHC: 33.6 g/dL (ref 30.0–36.0)
MCV: 92.3 fL (ref 78.0–100.0)
MCV: 92.3 fL (ref 78.0–100.0)
Platelets: 106 10*3/uL — ABNORMAL LOW (ref 150–400)
Platelets: 90 10*3/uL — ABNORMAL LOW (ref 150–400)
RBC: 4.56 MIL/uL (ref 4.22–5.81)
RBC: 4.8 MIL/uL (ref 4.22–5.81)
RDW: 14.1 % (ref 11.5–15.5)
RDW: 14.1 % (ref 11.5–15.5)
WBC: 21.7 10*3/uL — ABNORMAL HIGH (ref 4.0–10.5)
WBC: 19.3 10*3/uL — AB (ref 4.0–10.5)

## 2016-03-19 LAB — LACTIC ACID, PLASMA: Lactic Acid, Venous: 4.7 mmol/L (ref 0.5–2.0)

## 2016-03-19 LAB — MRSA PCR SCREENING: MRSA by PCR: NEGATIVE

## 2016-03-19 LAB — DIGOXIN LEVEL: Digoxin Level: 1.1 ng/mL (ref 0.8–2.0)

## 2016-03-19 MED ORDER — PNEUMOCOCCAL VAC POLYVALENT 25 MCG/0.5ML IJ INJ: 0.5000 mL | INJECTION | INTRAMUSCULAR | Status: DC

## 2016-03-19 NOTE — Progress Notes (Signed)
PROGRESS NOTE    Vincent [name]  WUJ:[id number] DOB: [birth year] DOA: 03/18/2016 PCP: Samuel Jester, DO Outpatient Specialists:    Brief Narrative:  34 yom with a hx of afib, CHF, dementia, s/p suprapubic catheter, and BPH, went to go get his catheter replaced but was found on the floor, confused, and with blood in his foley bag. He could not provide a history due to confusion. He lives alone and is checked up on by a neighbor. In the ED, he was found to be febrile and tachypneic, and hypotensive. EKG showed that he was in afib. He was referred for severe sepsis.   Assessment & Plan:   Principal Problem:   Sepsis (HCC) Active Problems:   ATRIAL FIBRILLATION, CHRONIC   CHF (congestive heart failure) (HCC)   Leukocytosis   Hypokalemia   Acute renal failure (HCC)   UTI (urinary tract infection)   Hypocalcemia   1. Severe sepsis secondary to suspected UTI with hematuria. Lactic acid 8.75 on admission but now trending down with IV hydration. WBC 19.3. UA positive for bacteria and RBC. Urine cx currently in process. Preliminary blood cx positive for gram negative rods. Will continue on Zosyn, but discontinue Vanc. Continue IVFs with caution in setting of CHF. Trend lactic acid. 2. Acute encephalopathy. Secondary to infection. Continue to monitor; appears to be improving. 3. Acute on chronic kidney disease stage 3. Baseline creatinine 1.3-1.6. Creatinine improving with hydration. Appears to be approaching baseline.  4. Hypokalemia. Replaced.  5. Hypocalcemia. Replaced. 6. Chronic diastolic CHF. Will need to monitor volume status closely in setting of sepsis. Restart lasix once hemodynamics have stabilized. BNP elevated on admission. Gentle fluids. Strict I&Os.  7. Chronic Afib. Currently rate controlled. Chronically on ASA. Has refused anticoagulation in the past. Not a good anticoagulation candidate.  8. Thrombocytopenia. Likely related to sepsis. Platelets 90. 9. BPH with chronic  suprapubic cath. Per hx, cath was recently replaced. Pt did have hematuria in urine bag, but this has cleared with flushes. Follow up with urology as an outpatient.    DVT prophylaxis: SCDs Code Status: Full Family Communication: no family present at bedside. Disposition Plan: Inpatient SDU. Discharge home once improved.   Consultants:   none  Procedures:   none  Antimicrobials:   Vanc 5/03 >> 5/04  Zosyn 5/03 >>    Subjective: Does not feel well. Breathing is doing fair. He is coughing slightly.   Objective: Filed Vitals:   03/19/16 0330 03/19/16 0400 03/19/16 0430 03/19/16 0500  BP: 110/88 122/64 92/53 102/55  Pulse: 52 56 49 52  Temp:  97.5 F (36.4 C)    TempSrc:  Axillary    Resp: Height:      Weight:    80.7 kg (177 lb 14.6 oz)  SpO2: 99% 99% 98% 97%    Intake/Output Summary (Last 24 hours) at 03/19/16 0631 Last data filed at 03/19/16 0600  Gross per 24 hour  Intake 1236.67 ml  Output      0 ml  Net 1236.67 ml   Filed Weights   03/18/16 1952 03/18/16 2306 03/19/16 0500  Weight: 77.111 kg (170 lb) 80.7 kg (177 lb 14.6 oz) 80.7 kg (177 lb 14.6 oz)    Examination: General exam: Appears calm and comfortable  Respiratory system: Coarse breath sounds at bases Cardiovascular system: Irregular Gastrointestinal system: Abdomen is nondistended, soft and nontender. No organomegaly or masses felt. Normal bowel sounds heard. Foley bag is clear urine. Central nervous system: Alert  and oriented. No focal neurological deficits. Extremities: Symmetric 5 x 5 power. Skin: No rashes, lesions or ulcers Psychiatry: Judgement and insight appear normal. Mood & affect appropriate.     Data Reviewed: I have personally reviewed following labs and imaging studies  CBC:  Recent Labs Lab 03/18/16 1953 03/18/16 1958  WBC 15.0*  --   NEUTROABS 14.3*  --   HGB 15.4 16.7  HCT 47.1 49.0  MCV 92.2  --   PLT 115*  --    Basic Metabolic Panel:  Recent  Labs Lab 03/18/16 1953 03/18/16 1958  NA 139 139  K 3.3* 3.3*  CL 100* 100*  CO2 21*  --   GLUCOSE 180* 173*  BUN 32* 33*  CREATININE 2.03* 1.80*  CALCIUM 9.3  --    GFR: Estimated Creatinine Clearance: 30 mL/min (by C-G formula based on Cr of 1.8). Liver Function Tests:  Recent Labs Lab 03/18/16 1953  AST 63*  ALT <5*  ALKPHOS 46  BILITOT 1.6*  PROT 6.9  ALBUMIN 4.0   No results for input(s): LIPASE, AMYLASE in the last 168 hours. No results for input(s): AMMONIA in the last 168 hours. Coagulation Profile:  Recent Labs Lab 03/18/16 1953  INR 1.63*   Cardiac Enzymes: No results for input(s): CKTOTAL, CKMB, CKMBINDEX, TROPONINI in the last 168 hours. BNP (last 3 results) No results for input(s): PROBNP in the last 8760 hours. HbA1C: No results for input(s): HGBA1C in the last 72 hours. CBG: No results for input(s): GLUCAP in the last 168 hours. Lipid Profile: No results for input(s): CHOL, HDL, LDLCALC, TRIG, CHOLHDL, LDLDIRECT in the last 72 hours. Thyroid Function Tests: No results for input(s): TSH, T4TOTAL, FREET4, T3FREE, THYROIDAB in the last 72 hours. Anemia Panel: No results for input(s): VITAMINB12, FOLATE, FERRITIN, TIBC, IRON, RETICCTPCT in the last 72 hours. Urine analysis:    Component Value Date/Time   COLORURINE BROWN* 03/19/2016 0512   APPEARANCEUR CLOUDY* 03/19/2016 0512   LABSPEC 1.025 03/19/2016 0512   PHURINE 6.5 03/19/2016 0512   GLUCOSEU 100* 03/19/2016 0512   HGBUR LARGE* 03/19/2016 0512   BILIRUBINUR NEGATIVE 03/19/2016 0512   KETONESUR 15* 03/19/2016 0512   PROTEINUR >300* 03/19/2016 0512   UROBILINOGEN 0.2 10/01/2014 2200   NITRITE POSITIVE* 03/19/2016 0512   LEUKOCYTESUR MODERATE* 03/19/2016 0512   Sepsis Labs: @LABRCNTIP (procalcitonin:4,lacticidven:4)  ) Recent Results (from the past 240 hour(s))  Blood Culture (routine x 2)     Status: None (Preliminary result)   Collection Time: 03/18/16  7:53 PM  Result Value Ref  Range Status   Specimen Description BLOOD RIGHT HAND  Final   Special Requests BOTTLES DRAWN AEROBIC AND ANAEROBIC 6CC  Final   Culture PENDING  Incomplete   Report Status PENDING  Incomplete  Blood Culture (routine x 2)     Status: None (Preliminary result)   Collection Time: 03/18/16  8:01 PM  Result Value Ref Range Status   Specimen Description BLOOD LEFT HAND  Final   Special Requests BOTTLES DRAWN AEROBIC AND ANAEROBIC 6CC  Final   Culture PENDING  Incomplete   Report Status PENDING  Incomplete  MRSA PCR Screening     Status: None   Collection Time: 03/18/16 11:00 PM  Result Value Ref Range Status   MRSA by PCR NEGATIVE NEGATIVE Final    Comment:        The GeneXpert MRSA Assay (FDA approved for NASAL specimens only), is one component of a comprehensive MRSA colonization surveillance program. It is not  intended to diagnose MRSA infection nor to guide or monitor treatment for MRSA infections.       Radiology Studies: Dg Chest Portable 1 View  03/18/2016  CLINICAL DATA:  Code sepsis. EXAM: PORTABLE CHEST 1 VIEW COMPARISON:  02/21/2014; 01/02/2014; 10/09/2013; chest CT - 10/09/2013 FINDINGS: Grossly unchanged enlarged cardiac silhouette and mediastinal contours with atherosclerotic plaque within the thoracic aorta. Pulmonary vasculature is less distinct than present examination with cephalization of flow. Perihilar heterogeneous opacities are favored to represent atelectasis. No discrete focal airspace opacities. No pleural effusion or pneumothorax. No acute osseous abnormalities. Suspected loose bodies overlying the anterior medial aspect of the left glenohumeral joint. IMPRESSION: Cardiomegaly with suspected superimposed pulmonary edema on this AP portable examination. No discrete focal airspace opacities to suggest pneumonia. Electronically Signed   By: Simonne Come M.D.   On: 03/18/2016 20:07     Scheduled Meds: . piperacillin-tazobactam (ZOSYN)  IV  3.375 g Intravenous Q8H    . sodium chloride flush  3 mL Intravenous Q12H  . vancomycin  750 mg Intravenous Q24H   Continuous Infusions: . sodium chloride 100 mL/hr at 03/19/16 0600     LOS: 1 day    Time spent: 25 minutes    Erick Blinks, MD [facility] Pager [phone]  If 7PM-7AM, please contact night-coverage [email or web address] Password [email or web address] 03/19/2016, 6:31 AM   By signing my name below, I, Adron Bene, attest that this documentation has been prepared under the direction and in the presence of Erick Blinks, MD. Electronically Signed: Adron Bene 03/19/2016 10:00am  I, Dr. Erick Blinks, personally performed the services described in this documentaiton. All medical record entries made by the scribe were at my direction and in my presence. I have reviewed the chart and agree that the record reflects my personal performance and is accurate and complete  Erick Blinks, MD, 03/19/2016 10:25 AM

## 2016-03-19 NOTE — Progress Notes (Signed)
Per nursing report, pt had his suprapubic catheter changed yesterday. Also per report, when he came into ED foley bag was full of blood, nursing staff unable to flush catheter and get return & foley was once again changed. When pt arrived to the floor, there was bright red blood in the tubing. Order received to irrigate bladder PRN for clots and obstructions. RN irrigated bladder with 100% return of sterile water irrigant and multiple large clots. Foley now draining yellow urine. Will continue to monitor.

## 2016-03-19 NOTE — Progress Notes (Signed)
Spoke with Dr. Kerry HoughMemon about patients increase in urethral discharge/clots and suprapubic cath bloody urine. MD said just keep and eye on it as well as pertinent labs and continue to irrigate PRN. Also discussed patients HR dropping to the 30-40s range and steady in the low 50's. MD said he will draw a dig level as he was on this med at home.

## 2016-03-20 DIAGNOSIS — I5032 Chronic diastolic (congestive) heart failure: Secondary | ICD-10-CM

## 2016-03-20 LAB — BASIC METABOLIC PANEL
Anion gap: 7 (ref 5–15)
BUN: 27 mg/dL — AB (ref 6–20)
CALCIUM: 8.3 mg/dL — AB (ref 8.9–10.3)
CO2: 23 mmol/L (ref 22–32)
Chloride: 109 mmol/L (ref 101–111)
Creatinine, Ser: 1.02 mg/dL (ref 0.61–1.24)
GFR calc Af Amer: >60 mL/min (ref >60)
GLUCOSE: 125 mg/dL — AB (ref 65–99)
Potassium: 3.8 mmol/L (ref 3.5–5.1)
Sodium: 139 mmol/L (ref 135–145)

## 2016-03-20 LAB — CBC
HCT: 39.6 % (ref 39.0–52.0)
Hemoglobin: 13.4 g/dL (ref 13.0–17.0)
MCH: 30.9 pg (ref 26.0–34.0)
MCHC: 33.8 g/dL (ref 30.0–36.0)
MCV: 91.5 fL (ref 78.0–100.0)
PLATELETS: 89 10*3/uL — AB (ref 150–400)
RBC: 4.33 MIL/uL (ref 4.22–5.81)
RDW: 14.2 % (ref 11.5–15.5)
WBC: 15.5 10*3/uL — ABNORMAL HIGH (ref 4.0–10.5)

## 2016-03-20 LAB — CALCIUM, IONIZED: CALCIUM, IONIZED, SERUM: 4.5 mg/dL (ref 4.5–5.6)

## 2016-03-20 MED ORDER — FINASTERIDE 5 MG PO TABS
5.0000 mg | ORAL_TABLET | Freq: Every day | ORAL | Status: DC
  Administered 2016-03-20 – 2016-03-21 (×2): 5 mg via ORAL
  Filled 2016-03-20 (×4): qty 1

## 2016-03-20 MED ORDER — TAMSULOSIN HCL 0.4 MG PO CAPS
0.4000 mg | ORAL_CAPSULE | Freq: Every day | ORAL | Status: DC
  Administered 2016-03-20 – 2016-03-21 (×2): 0.4 mg via ORAL
  Filled 2016-03-20 (×2): qty 1

## 2016-03-20 MED ORDER — FUROSEMIDE 20 MG PO TABS
20.0000 mg | ORAL_TABLET | Freq: Every day | ORAL | Status: DC
  Administered 2016-03-20 – 2016-03-21 (×2): 20 mg via ORAL
  Filled 2016-03-20 (×2): qty 1

## 2016-03-20 MED ORDER — MOMETASONE FURO-FORMOTEROL FUM 200-5 MCG/ACT IN AERO
2.0000 | INHALATION_SPRAY | Freq: Two times a day (BID) | RESPIRATORY_TRACT | Status: DC
  Administered 2016-03-20 – 2016-03-21 (×3): 2 via RESPIRATORY_TRACT
  Filled 2016-03-20: qty 8.8

## 2016-03-20 MED ORDER — OXYBUTYNIN CHLORIDE 5 MG PO TABS
5.0000 mg | ORAL_TABLET | Freq: Two times a day (BID) | ORAL | Status: DC
  Administered 2016-03-20 – 2016-03-21 (×3): 5 mg via ORAL
  Filled 2016-03-20 (×3): qty 1

## 2016-03-20 NOTE — Care Management Important Message (Signed)
Important Message  Patient Details  Name: Vincent BeathRobert W [name] MRN: [id number] Date of Birth: 1928/07/23   Medicare Important Message Given:  Yes    Malcolm Metrohildress, [place] Texidor Demske, RN 03/20/2016, 1:22 PM

## 2016-03-20 NOTE — Progress Notes (Signed)
Pharmacy Antibiotic Note  Vincent [name] is [name] 82 y.o. male admitted on 03/18/2016 with sepsis.  Pharmacy has been consulted for Zosyn dosing.  Plan: Zosyn 3.375g IV q8h (4 hour infusion) Monitor labs, micro and vitals.   Height: 5\' 7"  (170.2 cm) Weight: 185 lb 3 oz (84 kg) IBW/kg (Calculated) : 66.1  Temp (24hrs), Avg:97.9 F (36.6 C), Min:97 F (36.1 C), Max:98.9 F (37.2 C)   Recent Labs Lab 03/18/16 1953 03/18/16 1958 03/18/16 2141 03/19/16 0010 03/19/16 0900 03/19/16 0913 03/20/16 0515  WBC 15.0*  --   --   --   --  19.3* 15.5*  CREATININE 2.03* 1.80*  --   --  1.26*  --  1.02  LATICACIDVEN 8.1* 8.75* 5.22* 4.7*  --   --   --     Estimated Creatinine Clearance: 53.9 mL/min (by C-G formula based on Cr of 1.02).    Allergies  Allergen Reactions  . Hytrin [Terazosin] Other (See Comments)    [facility]  . Simvastatin Other (See Comments)    [facility]   Antimicrobials this admission: Vancomycin 5/3 >> 5/4 Zosyn 5/3 >>   Microbiology results: 5/3 BCx:  PENDING  Thank you for allowing pharmacy to be [name] part of this patient's care.  Vincent [name], Vincent [name] 03/20/2016 11:39 AM

## 2016-03-20 NOTE — Progress Notes (Addendum)
PROGRESS NOTE    Vincent [name]  ZOX:[id number] DOB: August 21, 1928 DOA: 03/18/2016 PCP: Samuel Jester, DO Outpatient Specialists:    Brief Narrative:  65 yom with a hx of afib, CHF, dementia, s/p suprapubic catheter, and BPH, went to go get his catheter replaced but was found on the floor, confused, and with blood in his foley bag. He could not provide a history due to confusion. He lives alone and is checked up on by a neighbor. In the ED, he was found to be febrile and tachypneic, and hypotensive. EKG showed that he was in afib. He was referred for severe sepsis.   Assessment & Plan:   Principal Problem:   Gram negative sepsis (HCC) Active Problems:   ATRIAL FIBRILLATION, CHRONIC   CHF (congestive heart failure) (HCC)   Leukocytosis   Hypokalemia   Acute renal failure (HCC)   UTI (urinary tract infection)   Hypocalcemia   Thrombocytopenia (HCC)   1. Gram negative sepsis with UTI with hematuria secondary to suprapubic catheter. Lactic acid 8.75 on admission but now trending down after IV hydration. WBC 15.5. UA positive for bacteria and RBC. Urine cx currently in process. Blood cx positive for gram negative rods recovered from both bottles. Will continue on Zosyn. Since BP has stabilized, will discontinue IVFs in order to avoid volume overload. Encourage PO fluids. 2. Acute encephalopathy. Improved. Secondary to infection. Continue to monitor; appears much improved. 3. Acute on chronic kidney disease stage 3. Baseline creatinine 1.3-1.6. Improved with hydration; will discontinue IVFs. 4. Hypokalemia. Replaced.  5. Hypocalcemia. Replaced.  6. Chronic diastolic CHF. Will need to monitor volume status closely in setting of sepsis. Will restart lasix since hemodynamics have stabilized. BNP elevated on admission. Discontinue fluids. Strict I&Os.  7. Chronic Afib. Currently rate controlled. Chronically on ASA. Has refused anticoagulation in the past. Not a good anticoagulation  candidate.  8. Thrombocytopenia. Likely related to sepsis. Platelets 89. 9. BPH with chronic suprapubic cath. Per hx, cath was recently replaced. Pt did have hematuria in urine bag, but this has cleared with flushes. Follow up with urology as an outpatient. Continue Flomax, finasteride, and ditropan   DVT prophylaxis: SCDs Code Status: Full Family Communication: Discussed with patient and caretaker at bedside. Disposition Plan: Transfer up to medical floor. Discharge home in likely 1-2 days.   Consultants:   none  Procedures:   none  Antimicrobials:   Vanc 5/03 >> 5/04  Zosyn 5/03 >>    Subjective: Feels improved. Is much more responsive today. His breathing is doing well and he is not coughing. Per caretaker, he is much improved today.  Objective: Filed Vitals:   03/20/16 0200 03/20/16 0300 03/20/16 0400 03/20/16 0500  BP:  127/71 135/70   Pulse: 58 47 55 54  Temp:   98 F (36.7 C)   TempSrc:   Oral   Resp: Height:      Weight:    84 kg (185 lb 3 oz)  SpO2: 92% 97% 94% 94%    Intake/Output Summary (Last 24 hours) at 03/20/16 0639 Last data filed at 03/19/16 2345  Gross per 24 hour  Intake   1940 ml  Output    500 ml  Net   1440 ml   Filed Weights   03/18/16 2306 03/19/16 0500 03/20/16 0500  Weight: 80.7 kg (177 lb 14.6 oz) 80.7 kg (177 lb 14.6 oz) 84 kg (185 lb 3 oz)    Examination: * General exam: Appears calm  and comfortable  Respiratory system: Clear to auscultation. Respiratory effort normal. Cardiovascular system: irregular heart sounds Gastrointestinal system: Abdomen is nondistended, soft and nontender. No organomegaly or masses felt. Normal bowel sounds heard. suprapubic catheter in place Central nervous system: Alert and oriented. No focal neurological deficits. Extremities: Symmetric 5 x 5 power. Skin: No rashes, lesions or ulcers Psychiatry: Judgement and insight appear normal. Mood & affect appropriate.    Data Reviewed: I  have personally reviewed following labs and imaging studies  CBC:  Recent Labs Lab 03/18/16 1953 03/18/16 1958 03/19/16 0913 03/20/16 0515  WBC 15.0*  --  19.3* 15.5*  NEUTROABS 14.3*  --   --   --   HGB 15.4 16.7 14.9 13.4  HCT 47.1 49.0 44.3 39.6  MCV 92.2  --  92.3 91.5  PLT 115*  --  90* 89*   Basic Metabolic Panel:  Recent Labs Lab 03/18/16 1953 03/18/16 1958 03/19/16 0900 03/20/16 0515  NA 139 139 141 139  K 3.3* 3.3* 4.4 3.8  CL 100* 100* 107 109  CO2 21*  --  23 23  GLUCOSE 180* 173* 128* 125*  BUN 32* 33* 28* 27*  CREATININE 2.03* 1.80* 1.26* 1.02  CALCIUM 9.3  --  8.5* 8.3*   GFR: Estimated Creatinine Clearance: 53.9 mL/min (by C-G formula based on Cr of 1.02). Liver Function Tests:  Recent Labs Lab 03/18/16 1953  AST 63*  ALT <5*  ALKPHOS 46  BILITOT 1.6*  PROT 6.9  ALBUMIN 4.0   No results for input(s): LIPASE, AMYLASE in the last 168 hours. No results for input(s): AMMONIA in the last 168 hours. Coagulation Profile:  Recent Labs Lab 03/18/16 1953  INR 1.63*   Cardiac Enzymes: No results for input(s): CKTOTAL, CKMB, CKMBINDEX, TROPONINI in the last 168 hours. BNP (last 3 results) No results for input(s): PROBNP in the last 8760 hours. HbA1C: No results for input(s): HGBA1C in the last 72 hours. CBG: No results for input(s): GLUCAP in the last 168 hours. Lipid Profile: No results for input(s): CHOL, HDL, LDLCALC, TRIG, CHOLHDL, LDLDIRECT in the last 72 hours. Thyroid Function Tests: No results for input(s): TSH, T4TOTAL, FREET4, T3FREE, THYROIDAB in the last 72 hours. Anemia Panel: No results for input(s): VITAMINB12, FOLATE, FERRITIN, TIBC, IRON, RETICCTPCT in the last 72 hours. Urine analysis:    Component Value Date/Time   COLORURINE BROWN* 03/19/2016 0512   APPEARANCEUR CLOUDY* 03/19/2016 0512   LABSPEC 1.025 03/19/2016 0512   PHURINE 6.5 03/19/2016 0512   GLUCOSEU 100* 03/19/2016 0512   HGBUR LARGE* 03/19/2016 0512    BILIRUBINUR NEGATIVE 03/19/2016 0512   KETONESUR 15* 03/19/2016 0512   PROTEINUR >300* 03/19/2016 0512   UROBILINOGEN 0.2 10/01/2014 2200   NITRITE POSITIVE* 03/19/2016 0512   LEUKOCYTESUR MODERATE* 03/19/2016 0512   Sepsis Labs: @LABRCNTIP (procalcitonin:4,lacticidven:4)  ) Recent Results (from the past 240 hour(s))  Blood Culture (routine x 2)     Status: None (Preliminary result)   Collection Time: 03/18/16  7:53 PM  Result Value Ref Range Status   Specimen Description BLOOD RIGHT HAND  Final   Special Requests BOTTLES DRAWN AEROBIC AND ANAEROBIC 6CC  Final   Culture  Setup Time   Final    GRAM NEGATIVE RODS RECOVERED FROM BOTH BOTTLES Gram Stain Report Called to,Read Back By and Verified With: HEITGER,J. AT 1134 ON 03/19/2016 BY BAUGHAM,M. Performed at [facility]    Culture PENDING  Incomplete   Report Status PENDING  Incomplete  Blood Culture (routine x  2)     Status: None (Preliminary result)   Collection Time: 03/18/16  8:01 PM  Result Value Ref Range Status   Specimen Description BLOOD LEFT HAND  Final   Special Requests BOTTLES DRAWN AEROBIC AND ANAEROBIC 6CC  Final   Culture  Setup Time   Final    GRAM NEGATIVE RODS RECOVERED FROM BOTH BOTTLES Gram Stain Report Called to,Read Back By and Verified With: HEIRGER,J. AT 1134 ON 03/19/2016 BY BAUGHAM,M. Performed at [facility]    Culture PENDING  Incomplete   Report Status PENDING  Incomplete  MRSA PCR Screening     Status: None   Collection Time: 03/18/16 11:00 PM  Result Value Ref Range Status   MRSA by PCR NEGATIVE NEGATIVE Final    Comment:        The GeneXpert MRSA Assay (FDA approved for NASAL specimens only), is one component of a comprehensive MRSA colonization surveillance program. It is not intended to diagnose MRSA infection nor to guide or monitor treatment for MRSA infections.       Radiology Studies: Dg Chest Portable 1 View  03/18/2016  CLINICAL DATA:  Code sepsis. EXAM:  PORTABLE CHEST 1 VIEW COMPARISON:  02/21/2014; 01/02/2014; 10/09/2013; chest CT - 10/09/2013 FINDINGS: Grossly unchanged enlarged cardiac silhouette and mediastinal contours with atherosclerotic plaque within the thoracic aorta. Pulmonary vasculature is less distinct than present examination with cephalization of flow. Perihilar heterogeneous opacities are favored to represent atelectasis. No discrete focal airspace opacities. No pleural effusion or pneumothorax. No acute osseous abnormalities. Suspected loose bodies overlying the anterior medial aspect of the left glenohumeral joint. IMPRESSION: Cardiomegaly with suspected superimposed pulmonary edema on this AP portable examination. No discrete focal airspace opacities to suggest pneumonia. Electronically Signed   By: Simonne Come M.D.   On: 03/18/2016 20:07     Scheduled Meds: . piperacillin-tazobactam (ZOSYN)  IV  3.375 g Intravenous Q8H  . pneumococcal 23 valent vaccine  0.5 mL Intramuscular Tomorrow-1000  . sodium chloride flush  3 mL Intravenous Q12H   Continuous Infusions: . sodium chloride 100 mL/hr at 03/19/16 2200     LOS: 2 days    Time spent: 25 minutes    Erick Blinks, MD [facility] Pager [phone]  If 7PM-7AM, please contact night-coverage [email or web address] Password [email or web address] 03/20/2016, 6:39 AM   By signing my name below, I, Adron Bene, attest that this documentation has been prepared under the direction and in the presence of Erick Blinks, MD. Electronically Signed: Adron Bene 03/20/2016 8:35am  I, Dr. Erick Blinks, personally performed the services described in this documentaiton. All medical record entries made by the scribe were at my direction and in my presence. I have reviewed the chart and agree that the record reflects my personal performance and is accurate and complete  Erick Blinks, MD, 03/20/2016 8:53 AM

## 2016-03-20 NOTE — Care Management Note (Signed)
Case Management Note  Patient Details  Name: Vincent BeathRobert W [name] MRN: [id number] Date of Birth: 05/20/1928  Subjective/Objective:                  Pt is from home, lives alone and has a neighbor 'Fayrene FearingJames' who provides support and transportation to appointments. Pt has PCP list but states he has no PCP and that he does not want one. Pt has cane but no other DME. Pt confused at this time. Pt plans to return home at DC. Pt will need PT eval. Prior to DC and if possible re-eval by CM prior to DC.   Action/Plan: Anticipate mentation to continue to improve. Will cont to follow.   Expected Discharge Date:    03/23/2016              Expected Discharge Plan:  Home/Self Care  In-House Referral:  NA  Discharge planning Services  CM Consult  Post Acute Care Choice:  NA Choice offered to:  NA  DME Arranged:    DME Agency:     HH Arranged:    HH Agency:     Status of Service:  Completed, signed off  Medicare Important Message Given:    yes Date Medicare IM Given:    Medicare IM give by:    Date Additional Medicare IM Given:    Additional Medicare Important Message give by:     If discussed at Long Length of Stay Meetings, dates discussed:    Additional Comments:  Malcolm MetroChildress, Ferrell Claiborne Demske, RN 03/20/2016, 1:11 PM

## 2016-03-20 NOTE — Progress Notes (Signed)
Pt left the floor with nursing staff.  Pt in no distress.  Belongings, paper chart, and medications sent with patient.  Report called to Dept. 300 RN.   Vitals signs as follows:  Temp: 97.1 F (36.2 C) (05/05 0800) Temp Source: Oral (05/05 0800) BP: 161/92 mmHg (05/05 1300) Pulse Rate: 58 (05/05 1300) Respirations: 31

## 2016-03-20 NOTE — Clinical Documentation Improvement (Addendum)
Hospitalist  Please document query responses in the progress notes and discharge summary. Do Not document query responses in the CDI BPA form in [facility].   Thank you.  Cause and effect relationships can not be assumed and must be documented by the attending physician.  Please document if there is a Suspected or Known relationship between the chronic suprapubic catheter and the patient's UTI and Sepsis  - UTI and Sepsis secondary to suprapubic catheter  - There is no cause and effect relationship  - Unable to clinically determine   Please exercise your independent, professional judgment when responding. A specific answer is not anticipated or expected.   Thank You, Jerral Ralphathy R Amairani Shuey  RN BSN [facility] [phone] Health Information Management [place]

## 2016-03-21 LAB — CBC
HEMATOCRIT: 42.2 % (ref 39.0–52.0)
HEMOGLOBIN: 14.1 g/dL (ref 13.0–17.0)
MCH: 30.5 pg (ref 26.0–34.0)
MCHC: 33.4 g/dL (ref 30.0–36.0)
MCV: 91.1 fL (ref 78.0–100.0)
Platelets: 105 10*3/uL — ABNORMAL LOW (ref 150–400)
RBC: 4.63 MIL/uL (ref 4.22–5.81)
RDW: 13.9 % (ref 11.5–15.5)
WBC: 14.4 10*3/uL — ABNORMAL HIGH (ref 4.0–10.5)

## 2016-03-21 LAB — URINE CULTURE: Culture: 6000 — AB

## 2016-03-21 LAB — BASIC METABOLIC PANEL
Anion gap: 9 (ref 5–15)
BUN: 23 mg/dL — AB (ref 6–20)
CHLORIDE: 105 mmol/L (ref 101–111)
CO2: 24 mmol/L (ref 22–32)
Calcium: 8.6 mg/dL — ABNORMAL LOW (ref 8.9–10.3)
Creatinine, Ser: 0.96 mg/dL (ref 0.61–1.24)
GFR calc non Af Amer: >60 mL/min (ref >60)
Glucose, Bld: 142 mg/dL — ABNORMAL HIGH (ref 65–99)
POTASSIUM: 3.6 mmol/L (ref 3.5–5.1)
SODIUM: 138 mmol/L (ref 135–145)

## 2016-03-21 MED ORDER — FUROSEMIDE 40 MG PO TABS: 20.0000 mg | ORAL_TABLET | Freq: Every day | ORAL | Status: DC

## 2016-03-21 MED ORDER — AMOXICILLIN-POT CLAVULANATE 875-125 MG PO TABS: 1.0000 | ORAL_TABLET | Freq: Two times a day (BID) | ORAL | Status: DC

## 2016-03-21 MED ORDER — ALPRAZOLAM 0.5 MG PO TABS: 0.5000 mg | ORAL_TABLET | Freq: Three times a day (TID) | ORAL | Status: DC | PRN

## 2016-03-21 NOTE — Progress Notes (Signed)
Patient discharged home with family.  IV removed - WNL.  Reviewed DC instructions and meds with patient and family member.  No questions at this time.  Verbalizes understanding.  Assisted off unit by RN.

## 2016-03-21 NOTE — Discharge Summary (Addendum)
Physician Discharge Summary  Vincent [name] ZOX:[id number] DOB: 16-Jun-1928 DOA: 03/18/2016  PCP: Samuel JesterYNTHIA BUTLER, DO  Admit date: 03/18/2016 Discharge date: 03/21/2016  Time spent: 35minutes  Recommendations for Outpatient Follow-up:  1. Follow up with primary care physician in 1-2 weeks   Discharge Diagnoses:  Principal Problem:   E coli sepsis (HCC) Active Problems:   ATRIAL FIBRILLATION, CHRONIC   CHF (congestive heart failure) (HCC)   Leukocytosis   Hypokalemia   Acute renal failure (HCC)   E coli UTI (urinary tract infection)   Hypocalcemia   Thrombocytopenia (HCC)   Discharge Condition: improved  Diet recommendation: low salt  Filed Weights   03/18/16 2306 03/19/16 0500 03/20/16 0500  Weight: 80.7 kg (177 lb 14.6 oz) 80.7 kg (177 lb 14.6 oz) 84 kg (185 lb 3 oz)    History of present illness:  This patient was brought to the hospital when he was found unresponsive. He has a history of benign prostatic hypertrophy and has a suprapubic catheter. His catheter was changed on the day of admission, but as the day progressed he became increasingly confused and was found on the floor with bloody urine in his Foley bag. He was brought to the ER for evaluation where he was noted to be febrile with a temperature 103.9. He was also hypotensive. He was admitted for further treatment  Hospital Course:  1. Escherichia coli sepsis with UTI with hematuria secondary to suprapubic catheter. Lactic acid 8.75 on admission but trended down after IV hydration. WBC 15.5. UA positive for bacteria and RBC. Urine cx showed nonspecific renal. Blood cx positive for Escherichia coli recovered from both bottles. He was treated with intravenous zosyn, but was transitioned to oral Augmentin for a total of 14 days. Currently, he is afebrile and hemodynamically stable. 2. Acute encephalopathy. Improved. Secondary to infection. Continue to monitor; appears much improved. 3. Acute on chronic kidney disease  stage 3. Baseline creatinine 1.3-1.6. Improved with hydration. 4. Hypokalemia. Replaced.  5. Hypocalcemia. Replaced.  6. Chronic diastolic CHF. Remained compensated. He is continued on his outpatient dose of Lasix.  7. Chronic Afib. Currently rate controlled. Chronically on ASA. Has refused anticoagulation in the past. Not a good anticoagulation candidate.  8. Thrombocytopenia. Likely related to sepsis. Trending up at the time of discharge. 9. BPH with chronic suprapubic cath. Per hx, cath was recently replaced. Pt did have hematuria in urine bag, but this has cleared with flushes. Follow up with urology as an outpatient. Continue Flomax, finasteride, and ditropan  Procedures:    Consultations:    Discharge Exam: Filed Vitals:   03/21/16 0521 03/21/16 1412  BP: 142/68 164/44  Pulse: 97 55  Temp: 98.4 F (36.9 C) 98.9 F (37.2 C)  Resp: 20 20    General: NAD Cardiovascular: S1, S2 RRR Respiratory: CTA B  Discharge Instructions   Discharge Instructions    Diet - low sodium heart healthy    Complete by:  As directed      Face-to-face encounter (required for Medicare/Medicaid patients)    Complete by:  As directed   I MEMON,JEHANZEB certify that this patient is under my care and that I, or a nurse practitioner or physician's assistant working with me, had a face-to-face encounter that meets the physician face-to-face encounter requirements with this patient on 03/21/2016. The encounter with the patient was in whole, or in part for the following medical condition(s) which is the primary reason for home health care (List medical condition): sepsis, uti  The encounter  with the patient was in whole, or in part, for the following medical condition, which is the primary reason for home health care:  uti, sepsis  I certify that, based on my findings, the following services are medically necessary home health services:  Nursing  Reason for Medically Necessary Home Health Services:   Skilled Nursing- Skilled Assessment/Observation  My clinical findings support the need for the above services:  Cognitive impairments, dementia, or mental confusion  that make it unsafe to leave home  Further, I certify that my clinical findings support that this patient is homebound due to:  Mental confusion     Home Health    Complete by:  As directed   To provide the following care/treatments:  RN     Increase activity slowly    Complete by:  As directed           Discharge Medication List as of 03/21/2016  5:45 PM    START taking these medications   Details  amoxicillin-clavulanate (AUGMENTIN) 875-125 MG tablet Take 1 tablet by mouth 2 (two) times daily., Starting 03/21/2016, Until Discontinued, Print      CONTINUE these medications which have CHANGED   Details  furosemide (LASIX) 40 MG tablet Take 0.5 tablets (20 mg total) by mouth daily., Starting 03/21/2016, Until Discontinued, Normal      CONTINUE these medications which have NOT CHANGED   Details  albuterol (PROVENTIL HFA;VENTOLIN HFA) 108 (90 BASE) MCG/ACT inhaler Inhale 2 puffs into the lungs every 6 (six) hours as needed. For asthma, Until Discontinued, Historical Med    albuterol (PROVENTIL) (2.5 MG/3ML) 0.083% nebulizer solution Take 2.5 mg by nebulization 2 (two) times daily as needed for wheezing or shortness of breath., Until Discontinued, Historical Med    amLODipine (NORVASC) 10 MG tablet Take 5 mg by mouth daily., Until Discontinued, Historical Med    aspirin EC 325 MG tablet Take 325 mg by mouth daily., Until Discontinued, Historical Med    budesonide-formoterol (SYMBICORT) 160-4.5 MCG/ACT inhaler Inhale 2 puffs into the lungs 2 (two) times daily., Until Discontinued, Historical Med    cholecalciferol (VITAMIN D) 1000 units tablet Take 1,000 Units by mouth daily., Until Discontinued, Historical Med    digoxin (LANOXIN) 0.25 MG tablet Take 0.25 mg by mouth daily., Until Discontinued, Historical Med    finasteride  (PROSCAR) 5 MG tablet Take 5 mg by mouth daily., Until Discontinued, Historical Med    oxybutynin (DITROPAN) 5 MG tablet Take 5 mg by mouth 2 (two) times daily., Until Discontinued, Historical Med    oxyCODONE-acetaminophen (PERCOCET/ROXICET) 5-325 MG per tablet Take 1 tablet by mouth every 4 (four) hours as needed. Pain, Until Discontinued, Historical Med    sennosides-docusate sodium (SENOKOT-S) 8.6-50 MG tablet Take 1 tablet by mouth daily as needed for constipation., Until Discontinued, Historical Med    tamsulosin (FLOMAX) 0.4 MG CAPS Take 0.4 mg by mouth daily., Until Discontinued, Historical Med       Allergies  Allergen Reactions  . Hytrin [Terazosin] Other (See Comments)    Allergies per [facility]  . Simvastatin Other (See Comments)    Allergy per [facility]      The results of significant diagnostics from this hospitalization (including imaging, microbiology, ancillary and laboratory) are listed below for reference.    Significant Diagnostic Studies: Dg Chest Portable 1 View  03/18/2016  CLINICAL DATA:  Code sepsis. EXAM: PORTABLE CHEST 1 VIEW COMPARISON:  02/21/2014; 01/02/2014; 10/09/2013; chest CT - 10/09/2013 FINDINGS: Grossly unchanged enlarged cardiac silhouette  and mediastinal contours with atherosclerotic plaque within the thoracic aorta. Pulmonary vasculature is less distinct than present examination with cephalization of flow. Perihilar heterogeneous opacities are favored to represent atelectasis. No discrete focal airspace opacities. No pleural effusion or pneumothorax. No acute osseous abnormalities. Suspected loose bodies overlying the anterior medial aspect of the left glenohumeral joint. IMPRESSION: Cardiomegaly with suspected superimposed pulmonary edema on this AP portable examination. No discrete focal airspace opacities to suggest pneumonia. Electronically Signed   By: Simonne Come M.D.   On: 03/18/2016 20:07    Microbiology: Recent Results (from  the past 240 hour(s))  Blood Culture (routine x 2)     Status: Abnormal (Preliminary result)   Collection Time: 03/18/16  7:53 PM  Result Value Ref Range Status   Specimen Description BLOOD RIGHT HAND  Final   Special Requests BOTTLES DRAWN AEROBIC AND ANAEROBIC 6CC  Final   Culture  Setup Time   Final    GRAM NEGATIVE RODS RECOVERED FROM BOTH BOTTLES Gram Stain Report Called to,Read Back By and Verified With: HEITGER,J. AT 1134 ON 03/19/2016 BY BAUGHAM,M. Performed at [facility]    Culture ESCHERICHIA COLI (A)  Final   Report Status PENDING  Incomplete   Organism ID, Bacteria ESCHERICHIA COLI  Final      Susceptibility   Escherichia coli - MIC*    AMPICILLIN <=2 SENSITIVE Sensitive     CEFAZOLIN <=4 SENSITIVE Sensitive     CEFEPIME <=1 SENSITIVE Sensitive     CEFTAZIDIME <=1 SENSITIVE Sensitive     CEFTRIAXONE <=1 SENSITIVE Sensitive     CIPROFLOXACIN <=0.25 SENSITIVE Sensitive     GENTAMICIN <=1 SENSITIVE Sensitive     IMIPENEM <=0.25 SENSITIVE Sensitive     TRIMETH/SULFA <=20 SENSITIVE Sensitive     AMPICILLIN/SULBACTAM <=2 SENSITIVE Sensitive     PIP/TAZO <=4 SENSITIVE Sensitive     * ESCHERICHIA COLI  Blood Culture (routine x 2)     Status: Abnormal (Preliminary result)   Collection Time: 03/18/16  8:01 PM  Result Value Ref Range Status   Specimen Description BLOOD LEFT HAND  Final   Special Requests BOTTLES DRAWN AEROBIC AND ANAEROBIC 6CC  Final   Culture  Setup Time   Final    GRAM NEGATIVE RODS RECOVERED FROM BOTH BOTTLES Gram Stain Report Called to,Read Back By and Verified With: HEIRGER,J. AT 1134 ON 03/19/2016 BY BAUGHAM,M. Performed at [facility]    Culture (A)  Final    ESCHERICHIA COLI SUSCEPTIBILITIES PERFORMED ON PREVIOUS CULTURE WITHIN THE LAST 5 DAYS. Performed at [facility]    Report Status PENDING  Incomplete  MRSA PCR Screening     Status: None   Collection Time: 03/18/16 11:00 PM  Result Value Ref Range Status   MRSA by  PCR NEGATIVE NEGATIVE Final    Comment:        The GeneXpert MRSA Assay (FDA approved for NASAL specimens only), is one component of a comprehensive MRSA colonization surveillance program. It is not intended to diagnose MRSA infection nor to guide or monitor treatment for MRSA infections.   Urine culture     Status: Abnormal   Collection Time: 03/19/16  4:00 AM  Result Value Ref Range Status   Specimen Description URINE, CATHETERIZED  Final   Special Requests NONE  Final   Culture (A)  Final    6,000 COLONIES/mL INSIGNIFICANT GROWTH Performed at [facility]    Report Status 03/21/2016 FINAL  Final     Labs:  Basic Metabolic Panel:  Recent Labs Lab 03/18/16 1953 03/18/16 1958 03/19/16 0900 03/20/16 0515 03/21/16 0803  NA 139 139 141 139 138  K 3.3* 3.3* 4.4 3.8 3.6  CL 100* 100* 107 109 105  CO2 21*  --  23 23 24   GLUCOSE 180* 173* 128* 125* 142*  BUN 32* 33* 28* 27* 23*  CREATININE 2.03* 1.80* 1.26* 1.02 0.96  CALCIUM 9.3  --  8.5* 8.3* 8.6*   Liver Function Tests:  Recent Labs Lab 03/18/16 1953  AST 63*  ALT <5*  ALKPHOS 46  BILITOT 1.6*  PROT 6.9  ALBUMIN 4.0   No results for input(s): LIPASE, AMYLASE in the last 168 hours. No results for input(s): AMMONIA in the last 168 hours. CBC:  Recent Labs Lab 03/18/16 1953 03/18/16 1958 03/19/16 0913 03/20/16 0515 03/21/16 0803  WBC 15.0*  --  19.3* 15.5* 14.4*  NEUTROABS 14.3*  --   --   --   --   HGB 15.4 16.7 14.9 13.4 14.1  HCT 47.1 49.0 44.3 39.6 42.2  MCV 92.2  --  92.3 91.5 91.1  PLT 115*  --  90* 89* 105*   Cardiac Enzymes: No results for input(s): CKTOTAL, CKMB, CKMBINDEX, TROPONINI in the last 168 hours. BNP: BNP (last 3 results)  Recent Labs  03/18/16 1953  BNP 388.0*    ProBNP (last 3 results) No results for input(s): PROBNP in the last 8760 hours.  CBG: No results for input(s): GLUCAP in the last 168 hours.     SignedErick Blinks MD.  [facility] 03/21/2016, 8:25 PM    Addendum 5/9 17:30:  I was informed by microbiology that patient had 1 blood culture positive for enterococcus and another culture positive for Streptococcus viridans. This is in addition to Escherichia coli that was found on both blood cultures. The patient had been discharged home on a total of 14 days of antibiotic therapy with Augmentin. Case was reviewed with Dr. Orvan Falconer, on call for infectious disease. Since patient is not known to have any underlying hardware/pacemaker, it was felt that current therapy with Augmentin would be adequate to cover polymicrobial blood cultures. Further investigations such as echocardiography or intravenous antibiotics were not recommended. Clinically the patient was improved at the time of discharge, afebrile and approaching his baseline level of functioning.  MEMON,JEHANZEB

## 2016-03-21 NOTE — Progress Notes (Signed)
[facility].  Spoke with Britta MccreedyBarbara and patient received services in the past from company.   [facility]

## 2016-03-25 LAB — CULTURE, BLOOD (ROUTINE X 2)

## 2016-04-07 ENCOUNTER — Other Ambulatory Visit (HOSPITAL_COMMUNITY)
Admission: RE | Admit: 2016-04-07 | Discharge: 2016-04-07 | Disposition: A | Discharge status: Home / Self Care | Payer: Medicare Other | Source: Other Acute Inpatient Hospital | Attending: Urology | Admitting: Urology

## 2016-04-07 DIAGNOSIS — N3000 Acute cystitis without hematuria: Secondary | ICD-10-CM | POA: Diagnosis present

## 2016-04-07 DIAGNOSIS — N399 Disorder of urinary system, unspecified: Secondary | ICD-10-CM | POA: Insufficient documentation

## 2016-04-07 LAB — URINALYSIS, ROUTINE W REFLEX MICROSCOPIC
BILIRUBIN URINE: NEGATIVE
GLUCOSE, UA: NEGATIVE mg/dL
Ketones, ur: NEGATIVE mg/dL
Nitrite: NEGATIVE
PROTEIN: NEGATIVE mg/dL
Specific Gravity, Urine: 1.015 (ref 1.005–1.030)
pH: 5.5 (ref 5.0–8.0)

## 2016-04-07 LAB — URINE MICROSCOPIC-ADD ON: SQUAMOUS EPITHELIAL / LPF: NONE SEEN

## 2016-04-11 LAB — URINE CULTURE

## 2016-04-15 ENCOUNTER — Ambulatory Visit (INDEPENDENT_AMBULATORY_CARE_PROVIDER_SITE_OTHER): Payer: Medicare Other | Admitting: Urology

## 2016-04-15 DIAGNOSIS — R339 Retention of urine, unspecified: Secondary | ICD-10-CM

## 2016-04-15 DIAGNOSIS — N3941 Urge incontinence: Secondary | ICD-10-CM

## 2016-04-15 DIAGNOSIS — N401 Enlarged prostate with lower urinary tract symptoms: Secondary | ICD-10-CM

## 2016-05-15 ENCOUNTER — Ambulatory Visit (INDEPENDENT_AMBULATORY_CARE_PROVIDER_SITE_OTHER): Payer: Medicare Other | Admitting: Urology

## 2016-05-15 DIAGNOSIS — R339 Retention of urine, unspecified: Secondary | ICD-10-CM | POA: Diagnosis not present

## 2016-05-15 DIAGNOSIS — N3941 Urge incontinence: Secondary | ICD-10-CM

## 2016-05-15 DIAGNOSIS — N401 Enlarged prostate with lower urinary tract symptoms: Secondary | ICD-10-CM

## 2016-06-12 ENCOUNTER — Ambulatory Visit (INDEPENDENT_AMBULATORY_CARE_PROVIDER_SITE_OTHER): Payer: Medicare Other | Admitting: Urology

## 2016-06-12 DIAGNOSIS — R338 Other retention of urine: Secondary | ICD-10-CM

## 2016-07-10 ENCOUNTER — Ambulatory Visit (INDEPENDENT_AMBULATORY_CARE_PROVIDER_SITE_OTHER): Payer: Medicare Other | Admitting: Urology

## 2016-07-10 DIAGNOSIS — N401 Enlarged prostate with lower urinary tract symptoms: Secondary | ICD-10-CM

## 2016-07-10 DIAGNOSIS — N3941 Urge incontinence: Secondary | ICD-10-CM | POA: Diagnosis not present

## 2016-07-10 DIAGNOSIS — R339 Retention of urine, unspecified: Secondary | ICD-10-CM

## 2016-08-07 ENCOUNTER — Ambulatory Visit (INDEPENDENT_AMBULATORY_CARE_PROVIDER_SITE_OTHER): Payer: Medicare Other | Admitting: Urology

## 2016-08-07 DIAGNOSIS — N401 Enlarged prostate with lower urinary tract symptoms: Secondary | ICD-10-CM

## 2016-08-07 DIAGNOSIS — N3941 Urge incontinence: Secondary | ICD-10-CM

## 2016-08-07 DIAGNOSIS — R339 Retention of urine, unspecified: Secondary | ICD-10-CM | POA: Diagnosis not present

## 2016-09-02 ENCOUNTER — Ambulatory Visit (INDEPENDENT_AMBULATORY_CARE_PROVIDER_SITE_OTHER): Payer: Medicare Other | Admitting: Urology

## 2016-09-02 DIAGNOSIS — N3941 Urge incontinence: Secondary | ICD-10-CM | POA: Diagnosis not present

## 2016-09-11 ENCOUNTER — Ambulatory Visit (INDEPENDENT_AMBULATORY_CARE_PROVIDER_SITE_OTHER): Payer: Medicare Other | Admitting: Urology

## 2016-09-11 DIAGNOSIS — R339 Retention of urine, unspecified: Secondary | ICD-10-CM | POA: Diagnosis not present

## 2016-10-23 ENCOUNTER — Ambulatory Visit (INDEPENDENT_AMBULATORY_CARE_PROVIDER_SITE_OTHER): Payer: Medicare Other | Admitting: Urology

## 2016-10-23 DIAGNOSIS — R338 Other retention of urine: Secondary | ICD-10-CM

## 2016-11-27 ENCOUNTER — Ambulatory Visit (INDEPENDENT_AMBULATORY_CARE_PROVIDER_SITE_OTHER): Payer: Medicare Other | Admitting: Urology

## 2016-11-27 DIAGNOSIS — R338 Other retention of urine: Secondary | ICD-10-CM

## 2017-01-01 ENCOUNTER — Ambulatory Visit (INDEPENDENT_AMBULATORY_CARE_PROVIDER_SITE_OTHER): Payer: Medicare Other | Admitting: Urology

## 2017-01-01 DIAGNOSIS — R338 Other retention of urine: Secondary | ICD-10-CM | POA: Diagnosis not present

## 2017-01-29 ENCOUNTER — Ambulatory Visit (INDEPENDENT_AMBULATORY_CARE_PROVIDER_SITE_OTHER): Payer: Medicare Other | Admitting: Urology

## 2017-01-29 DIAGNOSIS — R338 Other retention of urine: Secondary | ICD-10-CM | POA: Diagnosis not present

## 2017-03-05 ENCOUNTER — Ambulatory Visit (INDEPENDENT_AMBULATORY_CARE_PROVIDER_SITE_OTHER): Payer: Medicare Other | Admitting: Urology

## 2017-03-05 DIAGNOSIS — R338 Other retention of urine: Secondary | ICD-10-CM

## 2017-03-05 DIAGNOSIS — Z435 Encounter for attention to cystostomy: Secondary | ICD-10-CM

## 2017-04-02 ENCOUNTER — Ambulatory Visit (INDEPENDENT_AMBULATORY_CARE_PROVIDER_SITE_OTHER): Payer: Medicare Other | Admitting: Urology

## 2017-04-02 ENCOUNTER — Other Ambulatory Visit (HOSPITAL_COMMUNITY)
Admission: RE | Admit: 2017-04-02 | Discharge: 2017-04-02 | Disposition: A | Discharge status: Home / Self Care | Payer: Medicare Other | Source: Other Acute Inpatient Hospital | Attending: Urology | Admitting: Urology

## 2017-04-02 DIAGNOSIS — R338 Other retention of urine: Secondary | ICD-10-CM

## 2017-04-02 DIAGNOSIS — R300 Dysuria: Secondary | ICD-10-CM | POA: Diagnosis present

## 2017-04-02 DIAGNOSIS — N401 Enlarged prostate with lower urinary tract symptoms: Secondary | ICD-10-CM

## 2017-04-04 LAB — URINE CULTURE: Culture: >=100000 — AB

## 2017-04-30 ENCOUNTER — Ambulatory Visit (INDEPENDENT_AMBULATORY_CARE_PROVIDER_SITE_OTHER): Payer: Medicare Other | Admitting: Urology

## 2017-04-30 DIAGNOSIS — R338 Other retention of urine: Secondary | ICD-10-CM | POA: Diagnosis not present

## 2017-06-04 ENCOUNTER — Ambulatory Visit (INDEPENDENT_AMBULATORY_CARE_PROVIDER_SITE_OTHER): Payer: Medicare Other | Admitting: Urology

## 2017-06-04 DIAGNOSIS — R338 Other retention of urine: Secondary | ICD-10-CM | POA: Diagnosis not present

## 2017-07-02 ENCOUNTER — Ambulatory Visit (INDEPENDENT_AMBULATORY_CARE_PROVIDER_SITE_OTHER): Payer: Medicare Other | Admitting: Urology

## 2017-07-02 DIAGNOSIS — R338 Other retention of urine: Secondary | ICD-10-CM

## 2017-07-26 ENCOUNTER — Emergency Department (HOSPITAL_COMMUNITY): Payer: Medicare Other

## 2017-07-26 ENCOUNTER — Inpatient Hospital Stay (HOSPITAL_COMMUNITY)
Admission: EM | Admit: 2017-07-26 | Discharge: 2017-08-03 | DRG: 292 | Disposition: A | Discharge status: Home Health | Payer: Medicare Other | Attending: Internal Medicine | Admitting: Internal Medicine

## 2017-07-26 ENCOUNTER — Encounter (HOSPITAL_COMMUNITY): Payer: Self-pay | Admitting: Emergency Medicine

## 2017-07-26 DIAGNOSIS — I361 Nonrheumatic tricuspid (valve) insufficiency: Secondary | ICD-10-CM | POA: Diagnosis not present

## 2017-07-26 DIAGNOSIS — D696 Thrombocytopenia, unspecified: Secondary | ICD-10-CM | POA: Diagnosis present

## 2017-07-26 DIAGNOSIS — I509 Heart failure, unspecified: Secondary | ICD-10-CM

## 2017-07-26 DIAGNOSIS — I4891 Unspecified atrial fibrillation: Secondary | ICD-10-CM | POA: Diagnosis present

## 2017-07-26 DIAGNOSIS — E871 Hypo-osmolality and hyponatremia: Secondary | ICD-10-CM | POA: Diagnosis not present

## 2017-07-26 DIAGNOSIS — I482 Chronic atrial fibrillation: Secondary | ICD-10-CM | POA: Diagnosis present

## 2017-07-26 DIAGNOSIS — I959 Hypotension, unspecified: Secondary | ICD-10-CM | POA: Diagnosis not present

## 2017-07-26 DIAGNOSIS — T501X5A Adverse effect of loop [high-ceiling] diuretics, initial encounter: Secondary | ICD-10-CM | POA: Diagnosis not present

## 2017-07-26 DIAGNOSIS — Z7982 Long term (current) use of aspirin: Secondary | ICD-10-CM

## 2017-07-26 DIAGNOSIS — Y92230 Patient room in hospital as the place of occurrence of the external cause: Secondary | ICD-10-CM | POA: Diagnosis not present

## 2017-07-26 DIAGNOSIS — R001 Bradycardia, unspecified: Secondary | ICD-10-CM | POA: Diagnosis not present

## 2017-07-26 DIAGNOSIS — T447X5A Adverse effect of beta-adrenoreceptor antagonists, initial encounter: Secondary | ICD-10-CM | POA: Diagnosis not present

## 2017-07-26 DIAGNOSIS — N400 Enlarged prostate without lower urinary tract symptoms: Secondary | ICD-10-CM | POA: Diagnosis present

## 2017-07-26 DIAGNOSIS — F0390 Unspecified dementia without behavioral disturbance: Secondary | ICD-10-CM | POA: Diagnosis present

## 2017-07-26 DIAGNOSIS — Z435 Encounter for attention to cystostomy: Secondary | ICD-10-CM | POA: Diagnosis not present

## 2017-07-26 DIAGNOSIS — Z79891 Long term (current) use of opiate analgesic: Secondary | ICD-10-CM | POA: Diagnosis not present

## 2017-07-26 DIAGNOSIS — Z7951 Long term (current) use of inhaled steroids: Secondary | ICD-10-CM | POA: Diagnosis not present

## 2017-07-26 DIAGNOSIS — Z9181 History of falling: Secondary | ICD-10-CM

## 2017-07-26 DIAGNOSIS — Z888 Allergy status to other drugs, medicaments and biological substances status: Secondary | ICD-10-CM

## 2017-07-26 DIAGNOSIS — I110 Hypertensive heart disease with heart failure: Secondary | ICD-10-CM | POA: Diagnosis present

## 2017-07-26 DIAGNOSIS — N401 Enlarged prostate with lower urinary tract symptoms: Secondary | ICD-10-CM | POA: Diagnosis present

## 2017-07-26 DIAGNOSIS — I10 Essential (primary) hypertension: Secondary | ICD-10-CM | POA: Diagnosis not present

## 2017-07-26 DIAGNOSIS — Z87891 Personal history of nicotine dependence: Secondary | ICD-10-CM | POA: Diagnosis not present

## 2017-07-26 DIAGNOSIS — R338 Other retention of urine: Secondary | ICD-10-CM | POA: Diagnosis not present

## 2017-07-26 DIAGNOSIS — I5033 Acute on chronic diastolic (congestive) heart failure: Secondary | ICD-10-CM | POA: Diagnosis present

## 2017-07-26 DIAGNOSIS — I371 Nonrheumatic pulmonary valve insufficiency: Secondary | ICD-10-CM | POA: Diagnosis not present

## 2017-07-26 LAB — BASIC METABOLIC PANEL
ANION GAP: 7 (ref 5–15)
BUN: 36 mg/dL — AB (ref 6–20)
CHLORIDE: 100 mmol/L — AB (ref 101–111)
CO2: 30 mmol/L (ref 22–32)
Calcium: 9 mg/dL (ref 8.9–10.3)
Creatinine, Ser: 1 mg/dL (ref 0.61–1.24)
GFR calc Af Amer: >60 mL/min (ref >60)
GFR calc non Af Amer: >60 mL/min (ref >60)
Glucose, Bld: 209 mg/dL — ABNORMAL HIGH (ref 65–99)
POTASSIUM: 4.4 mmol/L (ref 3.5–5.1)
SODIUM: 137 mmol/L (ref 135–145)

## 2017-07-26 LAB — I-STAT TROPONIN, ED: Troponin i, poc: 0.02 ng/mL (ref 0.00–0.08)

## 2017-07-26 LAB — CBC WITH DIFFERENTIAL/PLATELET
BASOS ABS: 0 10*3/uL (ref 0.0–0.1)
Basophils Relative: 0 %
Eosinophils Absolute: 0.2 10*3/uL (ref 0.0–0.7)
Eosinophils Relative: 3 %
HCT: 42.6 % (ref 39.0–52.0)
HEMOGLOBIN: 13.9 g/dL (ref 13.0–17.0)
LYMPHS ABS: 1.1 10*3/uL (ref 0.7–4.0)
LYMPHS PCT: 13 %
MCH: 30.4 pg (ref 26.0–34.0)
MCHC: 32.6 g/dL (ref 30.0–36.0)
MCV: 93.2 fL (ref 78.0–100.0)
Monocytes Absolute: 0.9 10*3/uL (ref 0.1–1.0)
Monocytes Relative: 11 %
NEUTROS PCT: 73 %
Neutro Abs: 6.4 10*3/uL (ref 1.7–7.7)
Platelets: 146 10*3/uL — ABNORMAL LOW (ref 150–400)
RBC: 4.57 MIL/uL (ref 4.22–5.81)
RDW: 14.3 % (ref 11.5–15.5)
WBC: 8.7 10*3/uL (ref 4.0–10.5)

## 2017-07-26 LAB — BRAIN NATRIURETIC PEPTIDE: B NATRIURETIC PEPTIDE 5: 234 pg/mL — AB (ref 0.0–100.0)

## 2017-07-26 LAB — DIGOXIN LEVEL: Digoxin Level: 1.3 ng/mL (ref 0.8–2.0)

## 2017-07-26 MED ORDER — ENOXAPARIN SODIUM 40 MG/0.4ML ~~LOC~~ SOLN
40.0000 mg | SUBCUTANEOUS | Status: DC
  Administered 2017-07-27 – 2017-08-02 (×5): 40 mg via SUBCUTANEOUS
  Filled 2017-07-26 (×8): qty 0.4

## 2017-07-26 MED ORDER — SIMVASTATIN 20 MG PO TABS
20.0000 mg | ORAL_TABLET | Freq: Every day | ORAL | Status: DC
  Administered 2017-07-27 – 2017-08-02 (×6): 20 mg via ORAL
  Filled 2017-07-26 (×6): qty 1

## 2017-07-26 MED ORDER — SODIUM CHLORIDE 0.9% FLUSH
3.0000 mL | Freq: Two times a day (BID) | INTRAVENOUS | Status: DC
  Administered 2017-07-26: 3 mL via INTRAVENOUS
  Administered 2017-07-27: 10 mL via INTRAVENOUS
  Administered 2017-07-27 – 2017-08-03 (×12): 3 mL via INTRAVENOUS

## 2017-07-26 MED ORDER — METOPROLOL SUCCINATE ER 25 MG PO TB24
12.5000 mg | ORAL_TABLET | Freq: Every day | ORAL | Status: DC
  Administered 2017-07-28: 12.5 mg via ORAL
  Filled 2017-07-26 (×4): qty 1

## 2017-07-26 MED ORDER — MOMETASONE FURO-FORMOTEROL FUM 200-5 MCG/ACT IN AERO
2.0000 | INHALATION_SPRAY | Freq: Two times a day (BID) | RESPIRATORY_TRACT | Status: DC
  Administered 2017-07-27 – 2017-08-03 (×13): 2 via RESPIRATORY_TRACT
  Filled 2017-07-26 (×2): qty 8.8

## 2017-07-26 MED ORDER — FINASTERIDE 5 MG PO TABS
5.0000 mg | ORAL_TABLET | Freq: Every day | ORAL | Status: DC
  Administered 2017-07-27 – 2017-08-03 (×8): 5 mg via ORAL
  Filled 2017-07-26 (×9): qty 1

## 2017-07-26 MED ORDER — FUROSEMIDE 10 MG/ML IJ SOLN
40.0000 mg | Freq: Once | INTRAMUSCULAR | Status: AC
  Administered 2017-07-26: 40 mg via INTRAVENOUS
  Filled 2017-07-26: qty 4

## 2017-07-26 MED ORDER — TAMSULOSIN HCL 0.4 MG PO CAPS
0.4000 mg | ORAL_CAPSULE | Freq: Every day | ORAL | Status: DC
  Administered 2017-07-27 – 2017-08-03 (×8): 0.4 mg via ORAL
  Filled 2017-07-26 (×8): qty 1

## 2017-07-26 MED ORDER — AMLODIPINE BESYLATE 5 MG PO TABS
5.0000 mg | ORAL_TABLET | Freq: Every day | ORAL | Status: DC
  Administered 2017-07-27 – 2017-07-28 (×2): 5 mg via ORAL
  Filled 2017-07-26 (×2): qty 1

## 2017-07-26 MED ORDER — SODIUM CHLORIDE 0.9 % IV SOLN: 250.0000 mL | INTRAVENOUS | Status: DC | PRN

## 2017-07-26 MED ORDER — LISINOPRIL 5 MG PO TABS
5.0000 mg | ORAL_TABLET | Freq: Every day | ORAL | Status: DC
  Administered 2017-07-27 – 2017-07-28 (×2): 5 mg via ORAL
  Filled 2017-07-26 (×3): qty 1

## 2017-07-26 MED ORDER — IOPAMIDOL (ISOVUE-370) INJECTION 76%
100.0000 mL | Freq: Once | INTRAVENOUS | Status: AC | PRN
  Administered 2017-07-26: 100 mL via INTRAVENOUS

## 2017-07-26 MED ORDER — SODIUM CHLORIDE 0.9% FLUSH: 3.0000 mL | INTRAVENOUS | Status: DC | PRN

## 2017-07-26 MED ORDER — ONDANSETRON HCL 4 MG/2ML IJ SOLN: 4.0000 mg | Freq: Four times a day (QID) | INTRAMUSCULAR | Status: DC | PRN

## 2017-07-26 MED ORDER — ALBUTEROL SULFATE (2.5 MG/3ML) 0.083% IN NEBU: 2.5000 mg | INHALATION_SOLUTION | Freq: Two times a day (BID) | RESPIRATORY_TRACT | Status: DC | PRN

## 2017-07-26 MED ORDER — FUROSEMIDE 10 MG/ML IJ SOLN
40.0000 mg | Freq: Two times a day (BID) | INTRAMUSCULAR | Status: DC
  Administered 2017-07-26 – 2017-07-28 (×4): 40 mg via INTRAVENOUS
  Filled 2017-07-26 (×4): qty 4

## 2017-07-26 MED ORDER — VITAMIN D 1000 UNITS PO TABS
1000.0000 [IU] | ORAL_TABLET | Freq: Every day | ORAL | Status: DC
  Administered 2017-07-27 – 2017-08-03 (×8): 1000 [IU] via ORAL
  Filled 2017-07-26 (×8): qty 1

## 2017-07-26 MED ORDER — ACETAMINOPHEN 325 MG PO TABS
650.0000 mg | ORAL_TABLET | ORAL | Status: DC | PRN
  Administered 2017-07-30 – 2017-08-02 (×3): 650 mg via ORAL
  Filled 2017-07-26 (×3): qty 2

## 2017-07-26 MED ORDER — ASPIRIN EC 81 MG PO TBEC
81.0000 mg | DELAYED_RELEASE_TABLET | Freq: Every day | ORAL | Status: DC
  Administered 2017-07-27 – 2017-08-03 (×8): 81 mg via ORAL
  Filled 2017-07-26 (×8): qty 1

## 2017-07-26 MED ORDER — DIGOXIN 125 MCG PO TABS
0.2500 mg | ORAL_TABLET | Freq: Every day | ORAL | Status: DC
  Administered 2017-07-28: 0.25 mg via ORAL
  Filled 2017-07-26 (×4): qty 2

## 2017-07-26 MED ORDER — OXYBUTYNIN CHLORIDE 5 MG PO TABS
5.0000 mg | ORAL_TABLET | Freq: Two times a day (BID) | ORAL | Status: DC
  Administered 2017-07-26 – 2017-08-03 (×15): 5 mg via ORAL
  Filled 2017-07-26 (×16): qty 1

## 2017-07-26 NOTE — H&P (Signed)
History and Physical    TATSUO MUSIAL XBJ:[id number] DOB: 18-Jan-1928 DOA: 07/26/2017  Referring MD/NP/PA: Dietrich Pates, EDPA PCP: Samuel Jester, DO  Patient coming from: Home  Chief Complaint: Shortness of breath, lower extremity edema  HPI: JERMOND BURKEMPER is a 83 y.o. male with history of chronic diastolic CHF, atrial fibrillation, dementia, hypertension who presents today with the above complaints. He is a very poor historian and I am unable to obtain much history from him. I have called numbering chart belonging to a Mr. Eliane Decree who is his caretaker and healthcare power of attorney. Fayrene Fearing tells me that he was brought in today for a 2 day history of progressive lower extremity edema and increasing shortness of breath. In the ED he was found to have a new oxygen requirement, labs are essentially unremarkable, chest x-ray shows cardiac enlargement and mild pulmonary edema compatible with CHF. Right lower extremity Doppler is negative for DVT. Admission requested  Past Medical/Surgical History: Past Medical History:  Diagnosis Date  . Arachnoid cyst   . Atrial fibrillation (HCC)   . CHF (congestive heart failure) (HCC)   . Dementia   . Hypertension   . Lumbar spondylosis   . Prostate hypertrophy   . Sepsis (HCC)   . Urine retention   . Urine retention   . UTI (lower urinary tract infection)     Past Surgical History:  Procedure Laterality Date  . renal abscess drainage    . SUPRAPUBIC CATHETER PLACEMENT    . TRANSURETHRAL RESECTION OF PROSTATE      Social History:  reports that he has quit smoking. His smoking use included Cigarettes. He has never used smokeless tobacco. He reports that he does not drink alcohol or use drugs.  Allergies: Allergies  Allergen Reactions  . Hytrin [Terazosin] Other (See Comments)    Allergies per [facility]  . Simvastatin Other (See Comments)    Allergy per [facility]    Family History:  Family History  Problem  Relation Age of Onset  . Family history unknown: Yes    Prior to Admission medications   Medication Sig Start Date End Date Taking? Authorizing Provider  albuterol (PROVENTIL HFA;VENTOLIN HFA) 108 (90 BASE) MCG/ACT inhaler Inhale 2 puffs into the lungs every 6 (six) hours as needed. For asthma   Yes [provider]  albuterol (PROVENTIL) (2.5 MG/3ML) 0.083% nebulizer solution Take 2.5 mg by nebulization 2 (two) times daily as needed for wheezing or shortness of breath.   Yes [provider]  amLODipine (NORVASC) 10 MG tablet Take 5 mg by mouth daily.   Yes [provider]  aspirin EC 81 MG tablet Take 81 mg by mouth daily.   Yes [provider]  budesonide-formoterol (SYMBICORT) 160-4.5 MCG/ACT inhaler Inhale 2 puffs into the lungs 2 (two) times daily.   Yes [provider]  cholecalciferol (VITAMIN D) 1000 units tablet Take 1,000 Units by mouth daily.   Yes [provider]  digoxin (LANOXIN) 0.25 MG tablet Take 0.25 mg by mouth daily.   Yes [provider]  finasteride (PROSCAR) 5 MG tablet Take 5 mg by mouth daily.   Yes [provider]  furosemide (LASIX) 40 MG tablet Take 0.5 tablets (20 mg total) by mouth daily. 03/21/16  Yes Erick Blinks, MD  oxybutynin (DITROPAN) 5 MG tablet Take 5 mg by mouth 2 (two) times daily.   Yes [provider]  oxyCODONE-acetaminophen (PERCOCET) 10-325 MG tablet Take 1 tablet by mouth every 6 (  six) hours. 07/22/17  Yes [provider]  sennosides-docusate sodium (SENOKOT-S) 8.6-50 MG tablet Take 1 tablet by mouth daily as needed for constipation.   Yes [provider]  simvastatin (ZOCOR) 20 MG tablet Take 20 mg by mouth daily.   Yes [provider]  tamsulosin (FLOMAX) 0.4 MG CAPS Take 0.4 mg by mouth daily.   Yes [provider]    Review of Systems:  Unable to obtain given confusion   Physical Exam: Vitals:   07/26/17 1245 07/26/17 1400  07/26/17 1600 07/26/17 1645  BP:  (!) 122/58 122/62 (!) 119/59  Pulse:    (!) 53  Resp: (!) 24 (!) 23 (!) 26 20  Temp:    98.2 F (36.8 C)  TempSrc:    Oral  SpO2: 98%   94%  Weight:    92 kg (202 lb 13.2 oz)  Height:     (1.651 m)     Constitutional: NAD, calm, comfortable Eyes: PERRL, lids and conjunctivae normal ENMT: Mucous membranes are moist. Posterior pharynx clear of any exudate or lesions.Normal dentition.  Neck: normal, supple, no masses, no thyromegaly Respiratory: Bilateral mild basilar crackles  Cardiovascular: Regular rate and rhythm, no murmurs / rubs / gallops. No extremity edema. 2+ pedal pulses. No carotid bruits.  Abdomen: no tenderness, no masses palpated. No hepatosplenomegaly. Bowel sounds positive.  Musculoskeletal: Right greater than left lower extremity edema Skin: Multiple skin lacerations and bruises. Neurologic: CN 2-12 grossly intact. Sensation intact, DTR normal. Strength 5/5 in all 4.  Psychiatric: Normal judgment and insight. Alert and oriented x 3. Normal mood.    Labs on Admission: I have personally reviewed the following labs and imaging studies  CBC:  Recent Labs Lab 07/26/17 0925  WBC 8.7  NEUTROABS 6.4  HGB 13.9  HCT 42.6  MCV 93.2  PLT 146*   Basic Metabolic Panel:  Recent Labs Lab 07/26/17 0925  NA 137  K 4.4  CL 100*  CO2 30  GLUCOSE 209*  BUN 36*  CREATININE 1.00  CALCIUM 9.0   GFR: Estimated Creatinine Clearance: 54.3 mL/min (by C-G formula based on SCr of 1 mg/dL). Liver Function Tests: No results for input(s): AST, ALT, ALKPHOS, BILITOT, PROT, ALBUMIN in the last 168 hours. No results for input(s): LIPASE, AMYLASE in the last 168 hours. No results for input(s): AMMONIA in the last 168 hours. Coagulation Profile: No results for input(s): INR, PROTIME in the last 168 hours. Cardiac Enzymes: No results for input(s): CKTOTAL, CKMB, CKMBINDEX, TROPONINI in the last 168 hours. BNP (last 3 results) No results  for input(s): PROBNP in the last 8760 hours. HbA1C: No results for input(s): HGBA1C in the last 72 hours. CBG: No results for input(s): GLUCAP in the last 168 hours. Lipid Profile: No results for input(s): CHOL, HDL, LDLCALC, TRIG, CHOLHDL, LDLDIRECT in the last 72 hours. Thyroid Function Tests: No results for input(s): TSH, T4TOTAL, FREET4, T3FREE, THYROIDAB in the last 72 hours. Anemia Panel: No results for input(s): VITAMINB12, FOLATE, FERRITIN, TIBC, IRON, RETICCTPCT in the last 72 hours. Urine analysis:    Component Value Date/Time   COLORURINE YELLOW 04/07/2016 1030   APPEARANCEUR CLOUDY (A) 04/07/2016 1030   LABSPEC 1.015 04/07/2016 1030   PHURINE 5.5 04/07/2016 1030   GLUCOSEU NEGATIVE 04/07/2016 1030   HGBUR MODERATE (A) 04/07/2016 1030   BILIRUBINUR NEGATIVE 04/07/2016 1030   KETONESUR NEGATIVE 04/07/2016 1030   PROTEINUR NEGATIVE 04/07/2016 1030   UROBILINOGEN 0.2 10/01/2014 2200   NITRITE NEGATIVE 04/07/2016  1030   LEUKOCYTESUR LARGE (A) 04/07/2016 1030   Sepsis Labs: @LABRCNTIP (procalcitonin:4,lacticidven:4) )No results found for this or any previous visit (from the past 240 hour(s)).   Radiological Exams on Admission: Dg Chest 2 View  Result Date: 07/26/2017 CLINICAL DATA:  Shortness of breath. EXAM: CHEST  2 VIEW COMPARISON:  03/18/2016 FINDINGS: The heart size is moderately enlarged. There is aortic atherosclerosis. Mild diffuse pulmonary edema is again identified and appears similar to previous exam. IMPRESSION: 1. Cardiac enlargement, Aortic Atherosclerosis (ICD10-I70.0) and mild pulmonary edema compatible with CHF. Electronically Signed   By: Signa Kellaylor  Stroud M.D.   On: 07/26/2017 09:52   Ct Angio Chest Pe W/cm &/or Wo Cm  Result Date: 07/26/2017 CLINICAL DATA:  Shortness of breath. EXAM: CT ANGIOGRAPHY CHEST WITH CONTRAST TECHNIQUE: Multidetector CT imaging of the chest was performed using the standard protocol during bolus administration of intravenous  contrast. Multiplanar CT image reconstructions and MIPs were obtained to evaluate the vascular anatomy. CONTRAST:  100 cc Isovue 370 COMPARISON:  Chest x-ray dated 07/26/2017 and chest CT dated 10/09/2013 FINDINGS: Cardiovascular: Satisfactory opacification of the pulmonary arteries to the segmental level. No evidence of pulmonary embolism. Cardiomegaly. No pericardial effusion. Extensive aortic atherosclerosis. Extensive coronary artery calcification. Mediastinum/Nodes: Small reactive mediastinal lymph nodes. No significant adenopathy. Lungs/Pleura: Minimal interstitial edema at the lung bases. No pleural effusions. Calcified granuloma at the right lung base posteriorly. Upper Abdomen: No acute abnormality. Musculoskeletal: No chest wall abnormality. No acute or significant osseous findings. Review of the MIP images confirms the above findings. IMPRESSION: 1. No pulmonary emboli. 2. Chronic cardiomegaly with minimal interstitial pulmonary edema at the lung bases. 3. Extensive aortic atherosclerosis. 4. Coronary artery calcification. Electronically Signed   By: Francene BoyersJames  Maxwell M.D.   On: 07/26/2017 12:11   [place] Venous Img Lower Unilateral Right  Result Date: 07/26/2017 CLINICAL DATA:  Right lower extremity pain and edema with redness for 1 week. Shortness of breath for 1 day. EXAM: Right LOWER EXTREMITY VENOUS DOPPLER ULTRASOUND TECHNIQUE: Gray-scale sonography with graded compression, as well as color Doppler and duplex ultrasound were performed to evaluate the lower extremity deep venous systems from the level of the common femoral vein and including the common femoral, femoral, profunda femoral, popliteal and calf veins including the posterior tibial, peroneal and gastrocnemius veins when visible. The superficial great saphenous vein was also interrogated. Spectral Doppler was utilized to evaluate flow at rest and with distal augmentation maneuvers in the common femoral, femoral and popliteal veins. COMPARISON:   None. FINDINGS: Contralateral Common Femoral Vein: Respiratory phasicity is normal and symmetric with the symptomatic side. No evidence of thrombus. Normal compressibility. Common Femoral Vein: No evidence of thrombus. Normal compressibility, respiratory phasicity and response to augmentation. Saphenofemoral Junction: No evidence of thrombus. Normal compressibility and flow on color Doppler imaging. Profunda Femoral Vein: No evidence of thrombus. Normal compressibility and flow on color Doppler imaging. Femoral Vein: No evidence of thrombus. Normal compressibility, respiratory phasicity and response to augmentation. Popliteal Vein: No evidence of thrombus. Normal compressibility, respiratory phasicity and response to augmentation. Calf Veins: No evidence of thrombus. Normal compressibility and flow on color Doppler imaging. Superficial Great Saphenous Vein: No evidence of thrombus. Normal compressibility and flow on color Doppler imaging. Venous Reflux:  None. Other Findings: Edema is seen throughout the majority of the left lower extremity. IMPRESSION: No evidence of DVT within the right lower extremity. Electronically Signed   By: Leanna BattlesMelinda  Blietz M.D.   On: 07/26/2017 11:13   Dg Knee Complete 4  Views Right  Result Date: 07/26/2017 CLINICAL DATA:  Right knee pain secondary to a fall. EXAM: RIGHT KNEE - COMPLETE 4+ VIEW COMPARISON:  None FINDINGS: There is no fracture or dislocation or joint effusion. There is medial joint space narrowing with slight marginal osteophyte formation in the medial and patellofemoral compartments. There is soft tissue swelling anterior to the patella. Extensive arterial vascular calcifications around the knee joint. IMPRESSION: No acute bone abnormality. Arthritic changes as described. Prepatellar soft tissue swelling. Electronically Signed   By: Francene Boyers M.D.   On: 07/26/2017 11:23   Dg Foot Complete Right  Result Date: 07/26/2017 CLINICAL DATA:  Bruising and swelling of  the right foot secondary to a fall. EXAM: RIGHT FOOT COMPLETE - 3+ VIEW COMPARISON:  None. FINDINGS: There is no fracture or dislocation. Soft tissue swelling over the forefoot. Moderate arthritis at the first MTP joint. No other significant abnormality. IMPRESSION: No acute abnormalities. Electronically Signed   By: Francene Boyers M.D.   On: 07/26/2017 10:34    EKG: Independently reviewed. Atrial fibrillation, rate controlled, diffuse T-wave inversion unchanged from prior EKG in May 2017  Assessment/Plan Principal Problem:   Acute on chronic diastolic CHF (congestive heart failure) (HCC) Active Problems:   Essential hypertension   ATRIAL FIBRILLATION, CHRONIC   BPH (benign prostatic hyperplasia)    Acute on chronic diastolic CHF -Echo from September 2014 shows an ejection fraction of 65-70% with no wall motion abnormalities, moderate LVH, no mention of diastolic function. There is also mention of moderate aortic stenosis with a valve area of 1.12 cm. -Will update 2-D echo, strict I's and O's, daily weights, place on Lasix 40 mg IV every 12 hours and strive for negative fluid balance. -Start ACE inhibitor and beta blocker, aspirin, check lipids.  Atrial fibrillation -Rate controlled on digoxin, no anticoagulation. -Check digoxin level  BPH -Continue tamsulosin and finasteride  Dementia Appears at baseline per caregiver, some mild confusion.-   DVT prophylaxis: Lovenox  Code Status: Full code  Family Communication: Patient only  Disposition Plan: Hope for discharge home were 48-72 hours  Consults called: None  Admission status: Inpatient    Time Spent: 80 minutes   Chaya Jan MD [facility] Pager [phone]  If 7PM-7AM, please contact night-coverage [email or web address] Password [facility]  07/26/2017, 5:32 PM

## 2017-07-26 NOTE — ED Triage Notes (Signed)
Caretaker states pt started having sob last night. Sore on right knee x 1 week ago. ble swelling noted. A/o to most. Mild sob noted. Pt states he feels sob. C/o pain all over.

## 2017-07-26 NOTE — ED Notes (Signed)
02 2l [place] applied 

## 2017-07-26 NOTE — ED Notes (Signed)
Contact Eliane DecreeJames Reedy [phone] if anything is needed for the pt

## 2017-07-26 NOTE — ED Provider Notes (Signed)
[facility] DEPT Provider Note   CSN: [id number] Arrival date & time: 07/26/17  0845     History   Chief Complaint Chief Complaint  Patient presents with  . Shortness of Breath    HPI Vincent [name] is a 83 y.o. male.  HPI  Patient, with a past medical history of CHF, A. Fib not anticoagulated, hypertension, presents to ED for 3 day history of shortness of breath.He did have a fall 1 week ago and his caretaker states that his right knee has been bruised as well as his right foot. States that he is ambulatory with a cane usually. He has noticed that his shortness of breath has worsened this morning while eating breakfast. He does not wear oxygen at home.He denies any history of PE, DVT, recent surgeries, numbness and leg, head injury or loss of consciousness.  Past Medical History:  Diagnosis Date  . Arachnoid cyst   . Atrial fibrillation (HCC)   . CHF (congestive heart failure) (HCC)   . Dementia   . Hypertension   . Lumbar spondylosis   . Prostate hypertrophy   . Sepsis (HCC)   . Urine retention   . Urine retention   . UTI (lower urinary tract infection)     Patient Active Problem List   Diagnosis Date Noted  . UTI (urinary tract infection) 03/19/2016  . Hypocalcemia 03/19/2016  . Thrombocytopenia (HCC) 03/19/2016  . Leukocytosis 02/20/2014  . Hyponatremia 02/20/2014  . Hypokalemia 02/20/2014  . Dehydration 02/20/2014  . Gram negative sepsis (HCC) 02/20/2014  . Acute renal failure (HCC) 02/20/2014  . Bradycardia 10/10/2013  . Acute on chronic diastolic heart failure (HCC) 10/10/2013  . Acute ischemic stroke (HCC) 07/29/2013  . Right sided weakness 07/28/2013  . Yeast dermatitis 04/19/2013  . Catheter-associated urinary tract infection due to indwelling suprapubic catheter 04/17/2013  . Hematuria 04/17/2013  . SIRS (systemic inflammatory response syndrome) (HCC) 04/17/2013  . Hypoxia 04/17/2013  . Urine retention   . CHF (congestive heart failure)  (HCC)   . OBESITY 03/18/2010  . HYPERTENSION 03/12/2010  . ATRIAL FIBRILLATION, CHRONIC 03/12/2010  . BENIGN PROSTATIC HYPERTROPHY, HX OF 03/12/2010    Past Surgical History:  Procedure Laterality Date  . renal abscess drainage    . SUPRAPUBIC CATHETER PLACEMENT    . TRANSURETHRAL RESECTION OF PROSTATE         Home Medications    Prior to Admission medications   Medication Sig Start Date End Date Taking? Authorizing Provider  albuterol (PROVENTIL HFA;VENTOLIN HFA) 108 (90 BASE) MCG/ACT inhaler Inhale 2 puffs into the lungs every 6 (six) hours as needed. For asthma   Yes [provider]  albuterol (PROVENTIL) (2.5 MG/3ML) 0.083% nebulizer solution Take 2.5 mg by nebulization 2 (two) times daily as needed for wheezing or shortness of breath.   Yes [provider]  amLODipine (NORVASC) 10 MG tablet Take 5 mg by mouth daily.   Yes [provider]  aspirin EC 81 MG tablet Take 81 mg by mouth daily.   Yes [provider]  budesonide-formoterol (SYMBICORT) 160-4.5 MCG/ACT inhaler Inhale 2 puffs into the lungs 2 (two) times daily.   Yes [provider]  cholecalciferol (VITAMIN D) 1000 units tablet Take 1,000 Units by mouth daily.   Yes [provider]  digoxin (LANOXIN) 0.25 MG tablet Take 0.25 mg by mouth daily.   Yes [provider]  finasteride (PROSCAR) 5 MG tablet Take 5 mg by mouth daily.   Yes [provider]  furosemide (LASIX) 40 MG tablet Take 0.5 tablets (20 mg total) by mouth daily. 03/21/16  Yes Erick Blinks, MD  oxybutynin (DITROPAN) 5 MG tablet Take 5 mg by mouth 2 (two) times daily.   Yes [provider]  oxyCODONE-acetaminophen (PERCOCET) 10-325 MG tablet Take 1 tablet by mouth every 6 (six) hours. 07/22/17  Yes [provider]  sennosides-docusate sodium (SENOKOT-S) 8.6-50 MG tablet Take 1 tablet by mouth daily as needed for constipation.   Yes [provider]  simvastatin  (ZOCOR) 20 MG tablet Take 20 mg by mouth daily.   Yes [provider]  tamsulosin (FLOMAX) 0.4 MG CAPS Take 0.4 mg by mouth daily.   Yes [provider]    Family History Family History  Problem Relation Age of Onset  . Family history unknown: Yes    Social History Social History  Substance Use Topics  . Smoking status: Former Smoker    Types: Cigarettes  . Smokeless tobacco: Never Used  . Alcohol use No     Allergies   Hytrin [terazosin] and Simvastatin   Review of Systems Review of Systems  Constitutional: Negative for appetite change, chills and fever.  HENT: Negative for ear pain, rhinorrhea, sneezing and sore throat.   Eyes: Negative for photophobia and visual disturbance.  Respiratory: Positive for cough and shortness of breath. Negative for chest tightness and wheezing.   Cardiovascular: Positive for leg swelling. Negative for chest pain and palpitations.  Gastrointestinal: Negative for abdominal pain, blood in stool, constipation, diarrhea, nausea and vomiting.  Genitourinary: Negative for dysuria, hematuria and urgency.  Musculoskeletal: Negative for myalgias.  Skin: Negative for rash.  Neurological: Negative for dizziness, weakness and light-headedness.     Physical Exam Updated Vital Signs BP 122/65   Pulse (!) 46   Temp (!) 97.5 F (36.4 C) (Oral)   Resp (!) 22   SpO2 93%   Physical Exam  Constitutional: He appears well-developed and well-nourished. No distress.  HENT:  Head: Normocephalic and atraumatic.  Nose: Nose normal.  Eyes: Conjunctivae and EOM are normal. Left eye exhibits no discharge. No scleral icterus.  Neck: Normal range of motion. Neck supple.  Cardiovascular: Normal heart sounds and intact distal pulses.  An irregularly irregular rhythm present. Bradycardia present.  Exam reveals no gallop and no friction rub.   No murmur heard. Pulmonary/Chest: Effort normal and breath sounds normal. No respiratory distress.    Diffuse coarse breath sounds bilaterally.  Abdominal: Soft. Bowel sounds are normal. He exhibits no distension. There is no tenderness. There is no guarding.  Musculoskeletal: Normal range of motion. He exhibits edema and tenderness.  Swelling of the right lower extremity from knee down. There is tenderness to palpation of the calf. Erythema noted. There is tightness noted.  Neurological: He is alert. He exhibits normal muscle tone. Coordination normal.  Skin: Skin is warm and dry. No rash noted.  Psychiatric: He has a normal mood and affect.  Nursing note and vitals reviewed.    ED Treatments / Results  Labs (all labs ordered are listed, but only abnormal results are displayed) Labs Reviewed  CBC WITH DIFFERENTIAL/PLATELET - Abnormal; Notable for the following:       Result Value   Platelets 146 (*)    All other components within normal limits  BASIC METABOLIC PANEL - Abnormal; Notable for the following:    Chloride 100 (*)    Glucose, Bld 209 (*)    BUN 36 (*)    All other components  within normal limits  BRAIN NATRIURETIC PEPTIDE - Abnormal; Notable for the following:    B Natriuretic Peptide 234.0 (*)    All other components within normal limits  I-STAT TROPONIN, ED    EKG  EKG Interpretation  Date/Time:  Monday July 26 2017 09:16:04 EDT Ventricular Rate:  59 PR Interval:    QRS Duration: 79 QT Interval:  389 QTC Calculation: 386 R Axis:   80 Text Interpretation:  Atrial fibrillation Anteroseptal infarct, old Abnormal T, consider ischemia, diffuse leads When compared with ECG of 03/18/2016 Rate slower Nonspecific T wave abnormality is more pronounced Confirmed by Samuel JesterMcManus, Kathleen [phone]([id number]) on 07/26/2017 9:51:05 AM       Radiology Dg Chest 2 View  Result Date: 07/26/2017 CLINICAL DATA:  Shortness of breath. EXAM: CHEST  2 VIEW COMPARISON:  03/18/2016 FINDINGS: The heart size is moderately enlarged. There is aortic atherosclerosis. Mild diffuse pulmonary edema is  again identified and appears similar to previous exam. IMPRESSION: 1. Cardiac enlargement, Aortic Atherosclerosis (ICD10-I70.0) and mild pulmonary edema compatible with CHF. Electronically Signed   By: Signa Kellaylor  Stroud M.D.   On: 07/26/2017 09:52   Ct Angio Chest Pe W/cm &/or Wo Cm  Result Date: 07/26/2017 CLINICAL DATA:  Shortness of breath. EXAM: CT ANGIOGRAPHY CHEST WITH CONTRAST TECHNIQUE: Multidetector CT imaging of the chest was performed using the standard protocol during bolus administration of intravenous contrast. Multiplanar CT image reconstructions and MIPs were obtained to evaluate the vascular anatomy. CONTRAST:  100 cc Isovue 370 COMPARISON:  Chest x-ray dated 07/26/2017 and chest CT dated 10/09/2013 FINDINGS: Cardiovascular: Satisfactory opacification of the pulmonary arteries to the segmental level. No evidence of pulmonary embolism. Cardiomegaly. No pericardial effusion. Extensive aortic atherosclerosis. Extensive coronary artery calcification. Mediastinum/Nodes: Small reactive mediastinal lymph nodes. No significant adenopathy. Lungs/Pleura: Minimal interstitial edema at the lung bases. No pleural effusions. Calcified granuloma at the right lung base posteriorly. Upper Abdomen: No acute abnormality. Musculoskeletal: No chest wall abnormality. No acute or significant osseous findings. Review of the MIP images confirms the above findings. IMPRESSION: 1. No pulmonary emboli. 2. Chronic cardiomegaly with minimal interstitial pulmonary edema at the lung bases. 3. Extensive aortic atherosclerosis. 4. Coronary artery calcification. Electronically Signed   By: Francene BoyersJames  Maxwell M.D.   On: 07/26/2017 12:11   [place] Venous Img Lower Unilateral Right  Result Date: 07/26/2017 CLINICAL DATA:  Right lower extremity pain and edema with redness for 1 week. Shortness of breath for 1 day. EXAM: Right LOWER EXTREMITY VENOUS DOPPLER ULTRASOUND TECHNIQUE: Gray-scale sonography with graded compression, as well as color  Doppler and duplex ultrasound were performed to evaluate the lower extremity deep venous systems from the level of the common femoral vein and including the common femoral, femoral, profunda femoral, popliteal and calf veins including the posterior tibial, peroneal and gastrocnemius veins when visible. The superficial great saphenous vein was also interrogated. Spectral Doppler was utilized to evaluate flow at rest and with distal augmentation maneuvers in the common femoral, femoral and popliteal veins. COMPARISON:  None. FINDINGS: Contralateral Common Femoral Vein: Respiratory phasicity is normal and symmetric with the symptomatic side. No evidence of thrombus. Normal compressibility. Common Femoral Vein: No evidence of thrombus. Normal compressibility, respiratory phasicity and response to augmentation. Saphenofemoral Junction: No evidence of thrombus. Normal compressibility and flow on color Doppler imaging. Profunda Femoral Vein: No evidence of thrombus. Normal compressibility and flow on color Doppler imaging. Femoral Vein: No evidence of thrombus. Normal compressibility, respiratory phasicity and response to augmentation. Popliteal Vein: No evidence of  thrombus. Normal compressibility, respiratory phasicity and response to augmentation. Calf Veins: No evidence of thrombus. Normal compressibility and flow on color Doppler imaging. Superficial Great Saphenous Vein: No evidence of thrombus. Normal compressibility and flow on color Doppler imaging. Venous Reflux:  None. Other Findings: Edema is seen throughout the majority of the left lower extremity. IMPRESSION: No evidence of DVT within the right lower extremity. Electronically Signed   By: Leanna Battles M.D.   On: 07/26/2017 11:13   Dg Knee Complete 4 Views Right  Result Date: 07/26/2017 CLINICAL DATA:  Right knee pain secondary to a fall. EXAM: RIGHT KNEE - COMPLETE 4+ VIEW COMPARISON:  None FINDINGS: There is no fracture or dislocation or joint  effusion. There is medial joint space narrowing with slight marginal osteophyte formation in the medial and patellofemoral compartments. There is soft tissue swelling anterior to the patella. Extensive arterial vascular calcifications around the knee joint. IMPRESSION: No acute bone abnormality. Arthritic changes as described. Prepatellar soft tissue swelling. Electronically Signed   By: Francene Boyers M.D.   On: 07/26/2017 11:23   Dg Foot Complete Right  Result Date: 07/26/2017 CLINICAL DATA:  Bruising and swelling of the right foot secondary to a fall. EXAM: RIGHT FOOT COMPLETE - 3+ VIEW COMPARISON:  None. FINDINGS: There is no fracture or dislocation. Soft tissue swelling over the forefoot. Moderate arthritis at the first MTP joint. No other significant abnormality. IMPRESSION: No acute abnormalities. Electronically Signed   By: Francene Boyers M.D.   On: 07/26/2017 10:34    Procedures Procedures (including critical care time)  Medications Ordered in ED Medications  iopamidol (ISOVUE-370) 76 % injection 100 mL (100 mLs Intravenous Contrast Given 07/26/17 1152)  furosemide (LASIX) injection 40 mg (40 mg Intravenous Given 07/26/17 1239)     Initial Impression / Assessment and Plan / ED Course  I have reviewed the triage vital signs and the nursing notes.  Pertinent labs & imaging results that were available during my care of the patient were reviewed by me and considered in my medical decision making (see chart for details).     Patient presents to ED for evaluation of shortness of breath gradually worsening for the past 3 days. Does have a history of A. fib and is anticoagulated. Does not wear oxygen at home. He is now on 2 L nasal cannula here in the ED with oxygen saturations 93-96. Physical exam he does have some shortness of breath. His right leg is tender to touch and swollen. He did have a history of falls last week. X-ray of knee and foot returned as negative. DVT study returned as  negative. CTA returned as negative. Chest x-ray and CT both showed evidence of CHF exacerbation. BNP elevated to 234. Patient given IV Lasix 40 mg here in the ED. He is currently in A. Fib. We'll consult hospitalist for further management of patient's CHF exacerbation and new oxygen requirement. Appreciate the help of hospitalist for management of this patient.  Final Clinical Impressions(s) / ED Diagnoses   Final diagnoses:  None    New Prescriptions New Prescriptions   No medications on file     Dietrich Pates, PA-C 07/26/17 1318    Samuel Jester, [place] 07/28/17 [phone]

## 2017-07-26 NOTE — ED Notes (Signed)
Pt caregiver reports pt falling several days ago. Rt leg red and swollen Bruising noted to rt foot. Caregiver reports increase in SOB as well. resp labored upon assessment but pt speaking complete sentences.

## 2017-07-26 NOTE — ED Notes (Signed)
Pt and pt's family member in the room report pt's HR always runs low.  Pt is A&O x 4, denies any pain.

## 2017-07-27 ENCOUNTER — Inpatient Hospital Stay (HOSPITAL_COMMUNITY): Payer: Medicare Other

## 2017-07-27 DIAGNOSIS — I361 Nonrheumatic tricuspid (valve) insufficiency: Secondary | ICD-10-CM

## 2017-07-27 DIAGNOSIS — I10 Essential (primary) hypertension: Secondary | ICD-10-CM

## 2017-07-27 DIAGNOSIS — N400 Enlarged prostate without lower urinary tract symptoms: Secondary | ICD-10-CM

## 2017-07-27 DIAGNOSIS — I371 Nonrheumatic pulmonary valve insufficiency: Secondary | ICD-10-CM

## 2017-07-27 LAB — BASIC METABOLIC PANEL
ANION GAP: 7 (ref 5–15)
BUN: 29 mg/dL — ABNORMAL HIGH (ref 6–20)
CALCIUM: 8.6 mg/dL — AB (ref 8.9–10.3)
CO2: 33 mmol/L — AB (ref 22–32)
Chloride: 94 mmol/L — ABNORMAL LOW (ref 101–111)
Creatinine, Ser: 1.13 mg/dL (ref 0.61–1.24)
GFR, EST NON AFRICAN AMERICAN: 56 mL/min — AB (ref >60)
Glucose, Bld: 188 mg/dL — ABNORMAL HIGH (ref 65–99)
Potassium: 3.9 mmol/L (ref 3.5–5.1)
Sodium: 134 mmol/L — ABNORMAL LOW (ref 135–145)

## 2017-07-27 LAB — LIPID PANEL
Cholesterol: 91 mg/dL (ref 0–200)
HDL: 27 mg/dL — ABNORMAL LOW (ref >40)
LDL Cholesterol: 53 mg/dL (ref 0–99)
Total CHOL/HDL Ratio: 3.4 RATIO
Triglycerides: 56 mg/dL (ref <150)
VLDL: 11 mg/dL (ref 0–40)

## 2017-07-27 LAB — ECHOCARDIOGRAM COMPLETE
HEIGHTINCHES: 65 in
Weight: 3245.17 oz

## 2017-07-27 NOTE — Care Management Note (Addendum)
Case Management Note  Patient Details  Name: Vincent BeathRobert W Vivier MRN: [id number] Date of Birth: 1928/10/29  Subjective/Objective:    Adm with CHF. From home alone, walks with cane. Patient is a poor historian, but does report that a neighbor by the name of Fayrene FearingJames helps him out (grocery shopping and doctor visits). CM placed call to number on chart and left message for a return call. CM discussed CHF with patient, he doesn't seem to know what that is or how to manage. Patient does have weighing scales and reports weighing  sometimes.                 Action/Plan: Anticipate DC home with home health. Will await PT eval and call back [place] and [place].   ADDENDUM: Return [facility]- she confirmed all information for patient. They are his neighbors, take him to appointments, fix his food and help with his ADL's. Fayrene FearingJames, Teresa's husband helps patient with bathing, patient can dress himself. Rosey Batheresa shares she is looking into aid services supplied by [facility]. [facility]. She also shares patient has continuous oxygen setup at home if needed (by [facility]) but doesn't like to wear it.  Rosey Batheresa is agreeable to home health, has had [facility] in the past.    Expected Discharge Date:      07/28/2017            Expected Discharge Plan:  Home w Home Health Services  In-House Referral:     Discharge planning Services  CM Consult  Post Acute Care Choice:    Choice offered to:     DME Arranged:    DME Agency:     HH Arranged:   HH RN, PT HH Agency:   [facility]  Status of Service:  In process, will continue to follow  If discussed at Long Length of Stay Meetings, dates discussed:    Additional Comments:  Anasia Agro, Chrystine OilerSharley Diane, RN 07/27/2017, 1:51 PM

## 2017-07-27 NOTE — Progress Notes (Signed)
*  PRELIMINARY RESULTS* Echocardiogram 2D Echocardiogram has been performed.  Vincent [name], Vincent [name] 07/27/2017, 12:02 PM

## 2017-07-27 NOTE — Progress Notes (Signed)
Inpatient Diabetes Program Recommendations  AACE/ADA: New Consensus Statement on Inpatient Glycemic Control (2015)  Target Ranges:  Prepandial:   less than 140 mg/dL      Peak postprandial:   less than 180 mg/dL (1-2 hours)      Critically ill patients:  140 - 180 mg/dL  Results for DAVON, ABDELAZIZ (MRN [id number]) as of 07/27/2017 11:45  Ref. Range 07/26/2017 09:25 07/27/2017 05:24  Glucose Latest Ref Range: 65 - 99 mg/dL 209 (H) 188 (H)  Results for MCCADE, SULLENBERGER (MRN [id number]) as of 07/27/2017 11:45  Ref. Range 04/18/2013 04:38 07/28/2013 10:00  Hemoglobin A1C Latest Ref Range: <5.7 % 8.4 (H) 8.3 (H)    Review of Glycemic Control  Diabetes history: No Outpatient Diabetes medications: NA Current orders for Inpatient glycemic control: None  Inpatient Diabetes Program Recommendations: Correction (SSI): While inpatient, please consider ordering CBGs with Novolog correction scale ACHS. HgbA1C: Please consider ordering an A1C to evaluate glycemic control over the past 2-3 months.  Note: In reviewing the patient's chart there is not a documented history of diabetes.  However, A1C was 8.4% on 04/18/2013 and 8.3% on 07/28/2013.  Per ADA, if A1C is 6.5% or greater criteria for diabetes diagnosis is met. Initial glucose 209 mg/dl and fasting glucose 188 this morning. Please consider ordering an A1C, CBGs and Novolog correction scale ACHS while inpatient.  Thanks, Barnie Alderman, RN, MSN, CDE Diabetes Coordinator Inpatient Diabetes Program [phone] (Team Pager from 8am to 5pm)

## 2017-07-27 NOTE — Care Management Important Message (Signed)
Important Message  Patient Details  Name: Vincent BeathRobert W [name] MRN: [id number] Date of Birth: Apr 14, 1928   Medicare Important Message Given:  Yes    Jamahl Lemmons, Chrystine OilerSharley Diane, RN 07/27/2017, 1:55 PM

## 2017-07-27 NOTE — Progress Notes (Signed)
Central tele called to let me know that patient is brady less than normal and staying in 40's-50's. He did drop down to 37 for a brief second. I let dr.david know.

## 2017-07-27 NOTE — Progress Notes (Signed)
PROGRESS NOTE    Vincent [name]  WUJ:[id number] DOB: Jan 02, 1928 DOA: 07/26/2017 PCP: Samuel Jester, DO     Brief Narrative:  83 y/o man admitted from home on 9/10 due to SOB and progressive LE edema. Thought to have acute diastolic CHF and admission requested.   Assessment & Plan:   Principal Problem:   Acute on chronic diastolic CHF (congestive heart failure) (HCC) Active Problems:   Essential hypertension   ATRIAL FIBRILLATION, CHRONIC   BPH (benign prostatic hyperplasia)   Acute on Chronic Systolic CHF -ECHO from 9/14: EF 65-70% with no WMA, moderate LVH, no mention of diastolic function. Moderate AS with valve area of 1.12 cm2. -Updated ECHO has been requested. -Started on ACE-I, BB, ASA. -LDL 53, TC 91. -Has diuresed 2.1 L since admission but still appears volume overloaded on exam.  A Fib -Rate controlled on digoxin. Dig level therapeutic. -Not anticoagulated.  BPH -Continue tamsulosin and finasteride.  Dementia -Some mild confusion.   DVT prophylaxis: Lovenox Code Status: Full code Family Communication: Patient only Disposition Plan: Home when ready, likely 24-48 hours  Consultants:   None  Procedures:   None  Antimicrobials:  Anti-infectives    None       Subjective: Has no complaints, Denies CP/SOB.  Objective: Vitals:   07/26/17 1645 07/26/17 2105 07/27/17 0542 07/27/17 0758  BP: (!) 119/59 121/61 (!) 141/62   Pulse: (!) 53 63 (!) 57   Resp: Temp: 98.2 F (36.8 C) 98.8 F (37.1 C) 98.1 F (36.7 C)   TempSrc: Oral Oral Oral   SpO2: 94% 95% 92% (!) 89%  Weight: 92 kg (202 lb 13.2 oz)     Height:  (1.651 m)       Intake/Output Summary (Last 24 hours) at 07/27/17 1146 Last data filed at 07/27/17 0900  Gross per 24 hour  Intake              600 ml  Output             2700 ml  Net            -2100 ml   Filed Weights   07/26/17 1645  Weight: 92 kg (202 lb 13.2 oz)    Examination:  General exam:  Alert, awake, oriented x 3, seems confused at times. Respiratory system: Clear to auscultation. Respiratory effort normal. Cardiovascular system:RRR. No murmurs, rubs, gallops. Gastrointestinal system: Abdomen is nondistended, soft and nontender. No organomegaly or masses felt. Normal bowel sounds heard. Central nervous system: Alert.. No focal neurological deficits. Extremities: No C/C/E, +pedal pulses Skin: No rashes, lesions or ulcers Psychiatry: Unable to fully assess given confusion, but mood appears stable.    Data Reviewed: I have personally reviewed following labs and imaging studies  CBC:  Recent Labs Lab 07/26/17 0925  WBC 8.7  NEUTROABS 6.4  HGB 13.9  HCT 42.6  MCV 93.2  PLT 146*   Basic Metabolic Panel:  Recent Labs Lab 07/26/17 0925 07/27/17 0524  NA 137 134*  K 4.4 3.9  CL 100* 94*  CO2 30 33*  GLUCOSE 209* 188*  BUN 36* 29*  CREATININE 1.00 1.13  CALCIUM 9.0 8.6*   GFR: Estimated Creatinine Clearance: 48 mL/min (by C-G formula based on SCr of 1.13 mg/dL). Liver Function Tests: No results for input(s): AST, ALT, ALKPHOS, BILITOT, PROT, ALBUMIN in the last 168 hours. No results for input(s): LIPASE, AMYLASE in the last 168 hours. No results for input(s): AMMONIA  in the last 168 hours. Coagulation Profile: No results for input(s): INR, PROTIME in the last 168 hours. Cardiac Enzymes: No results for input(s): CKTOTAL, CKMB, CKMBINDEX, TROPONINI in the last 168 hours. BNP (last 3 results) No results for input(s): PROBNP in the last 8760 hours. HbA1C: No results for input(s): HGBA1C in the last 72 hours. CBG: No results for input(s): GLUCAP in the last 168 hours. Lipid Profile:  Recent Labs  07/27/17 0524  CHOL 91  HDL 27*  LDLCALC 53  TRIG 56  CHOLHDL 3.4   Thyroid Function Tests: No results for input(s): TSH, T4TOTAL, FREET4, T3FREE, THYROIDAB in the last 72 hours. Anemia Panel: No results for input(s): VITAMINB12, FOLATE, FERRITIN,  TIBC, IRON, RETICCTPCT in the last 72 hours. Urine analysis:    Component Value Date/Time   COLORURINE YELLOW 04/07/2016 1030   APPEARANCEUR CLOUDY (A) 04/07/2016 1030   LABSPEC 1.015 04/07/2016 1030   PHURINE 5.5 04/07/2016 1030   GLUCOSEU NEGATIVE 04/07/2016 1030   HGBUR MODERATE (A) 04/07/2016 1030   BILIRUBINUR NEGATIVE 04/07/2016 1030   KETONESUR NEGATIVE 04/07/2016 1030   PROTEINUR NEGATIVE 04/07/2016 1030   UROBILINOGEN 0.2 10/01/2014 2200   NITRITE NEGATIVE 04/07/2016 1030   LEUKOCYTESUR LARGE (A) 04/07/2016 1030   Sepsis Labs: @LABRCNTIP (procalcitonin:4,lacticidven:4)  )No results found for this or any previous visit (from the past 240 hour(s)).       Radiology Studies: Dg Chest 2 View  Result Date: 07/26/2017 CLINICAL DATA:  Shortness of breath. EXAM: CHEST  2 VIEW COMPARISON:  03/18/2016 FINDINGS: The heart size is moderately enlarged. There is aortic atherosclerosis. Mild diffuse pulmonary edema is again identified and appears similar to previous exam. IMPRESSION: 1. Cardiac enlargement, Aortic Atherosclerosis (ICD10-I70.0) and mild pulmonary edema compatible with CHF. Electronically Signed   By: Signa Kellaylor  Stroud M.D.   On: 07/26/2017 09:52   Ct Angio Chest Pe W/cm &/or Wo Cm  Result Date: 07/26/2017 CLINICAL DATA:  Shortness of breath. EXAM: CT ANGIOGRAPHY CHEST WITH CONTRAST TECHNIQUE: Multidetector CT imaging of the chest was performed using the standard protocol during bolus administration of intravenous contrast. Multiplanar CT image reconstructions and MIPs were obtained to evaluate the vascular anatomy. CONTRAST:  100 cc Isovue 370 COMPARISON:  Chest x-ray dated 07/26/2017 and chest CT dated 10/09/2013 FINDINGS: Cardiovascular: Satisfactory opacification of the pulmonary arteries to the segmental level. No evidence of pulmonary embolism. Cardiomegaly. No pericardial effusion. Extensive aortic atherosclerosis. Extensive coronary artery calcification.  Mediastinum/Nodes: Small reactive mediastinal lymph nodes. No significant adenopathy. Lungs/Pleura: Minimal interstitial edema at the lung bases. No pleural effusions. Calcified granuloma at the right lung base posteriorly. Upper Abdomen: No acute abnormality. Musculoskeletal: No chest wall abnormality. No acute or significant osseous findings. Review of the MIP images confirms the above findings. IMPRESSION: 1. No pulmonary emboli. 2. Chronic cardiomegaly with minimal interstitial pulmonary edema at the lung bases. 3. Extensive aortic atherosclerosis. 4. Coronary artery calcification. Electronically Signed   By: Francene BoyersJames  Maxwell M.D.   On: 07/26/2017 12:11   [place] Venous Img Lower Unilateral Right  Result Date: 07/26/2017 CLINICAL DATA:  Right lower extremity pain and edema with redness for 1 week. Shortness of breath for 1 day. EXAM: Right LOWER EXTREMITY VENOUS DOPPLER ULTRASOUND TECHNIQUE: Gray-scale sonography with graded compression, as well as color Doppler and duplex ultrasound were performed to evaluate the lower extremity deep venous systems from the level of the common femoral vein and including the common femoral, femoral, profunda femoral, popliteal and calf veins including the posterior tibial, peroneal and gastrocnemius  veins when visible. The superficial great saphenous vein was also interrogated. Spectral Doppler was utilized to evaluate flow at rest and with distal augmentation maneuvers in the common femoral, femoral and popliteal veins. COMPARISON:  None. FINDINGS: Contralateral Common Femoral Vein: Respiratory phasicity is normal and symmetric with the symptomatic side. No evidence of thrombus. Normal compressibility. Common Femoral Vein: No evidence of thrombus. Normal compressibility, respiratory phasicity and response to augmentation. Saphenofemoral Junction: No evidence of thrombus. Normal compressibility and flow on color Doppler imaging. Profunda Femoral Vein: No evidence of thrombus.  Normal compressibility and flow on color Doppler imaging. Femoral Vein: No evidence of thrombus. Normal compressibility, respiratory phasicity and response to augmentation. Popliteal Vein: No evidence of thrombus. Normal compressibility, respiratory phasicity and response to augmentation. Calf Veins: No evidence of thrombus. Normal compressibility and flow on color Doppler imaging. Superficial Great Saphenous Vein: No evidence of thrombus. Normal compressibility and flow on color Doppler imaging. Venous Reflux:  None. Other Findings: Edema is seen throughout the majority of the left lower extremity. IMPRESSION: No evidence of DVT within the right lower extremity. Electronically Signed   By: Leanna Battles M.D.   On: 07/26/2017 11:13   Dg Knee Complete 4 Views Right  Result Date: 07/26/2017 CLINICAL DATA:  Right knee pain secondary to a fall. EXAM: RIGHT KNEE - COMPLETE 4+ VIEW COMPARISON:  None FINDINGS: There is no fracture or dislocation or joint effusion. There is medial joint space narrowing with slight marginal osteophyte formation in the medial and patellofemoral compartments. There is soft tissue swelling anterior to the patella. Extensive arterial vascular calcifications around the knee joint. IMPRESSION: No acute bone abnormality. Arthritic changes as described. Prepatellar soft tissue swelling. Electronically Signed   By: Francene Boyers M.D.   On: 07/26/2017 11:23   Dg Foot Complete Right  Result Date: 07/26/2017 CLINICAL DATA:  Bruising and swelling of the right foot secondary to a fall. EXAM: RIGHT FOOT COMPLETE - 3+ VIEW COMPARISON:  None. FINDINGS: There is no fracture or dislocation. Soft tissue swelling over the forefoot. Moderate arthritis at the first MTP joint. No other significant abnormality. IMPRESSION: No acute abnormalities. Electronically Signed   By: Francene Boyers M.D.   On: 07/26/2017 10:34        Scheduled Meds: . amLODipine  5 mg Oral Daily  . aspirin EC  81 mg Oral  Daily  . cholecalciferol  1,000 Units Oral Daily  . digoxin  0.25 mg Oral Daily  . enoxaparin (LOVENOX) injection  40 mg Subcutaneous Q24H  . finasteride  5 mg Oral Daily  . furosemide  40 mg Intravenous Q12H  . lisinopril  5 mg Oral Daily  . metoprolol succinate  12.5 mg Oral Daily  . mometasone-formoterol  2 puff Inhalation BID  . oxybutynin  5 mg Oral BID  . simvastatin  20 mg Oral q1800  . sodium chloride flush  3 mL Intravenous Q12H  . tamsulosin  0.4 mg Oral Daily   Continuous Infusions: . sodium chloride       LOS: 1 day    Time spent: 35 minutes. Greater than 50% of this time was spent in direct contact with the patient coordinating care.     Chaya Jan, MD [facility] Pager [phone]  If 7PM-7AM, please contact night-coverage [email or web address] Password [facility] 07/27/2017, 11:46 AM

## 2017-07-28 LAB — BASIC METABOLIC PANEL
ANION GAP: 9 (ref 5–15)
BUN: 35 mg/dL — ABNORMAL HIGH (ref 6–20)
CALCIUM: 8.6 mg/dL — AB (ref 8.9–10.3)
CHLORIDE: 92 mmol/L — AB (ref 101–111)
CO2: 33 mmol/L — AB (ref 22–32)
Creatinine, Ser: 1.22 mg/dL (ref 0.61–1.24)
GFR calc non Af Amer: 51 mL/min — ABNORMAL LOW (ref >60)
GFR, EST AFRICAN AMERICAN: 60 mL/min — AB (ref >60)
Glucose, Bld: 182 mg/dL — ABNORMAL HIGH (ref 65–99)
POTASSIUM: 3.6 mmol/L (ref 3.5–5.1)
SODIUM: 134 mmol/L — AB (ref 135–145)

## 2017-07-28 MED ORDER — FUROSEMIDE 40 MG PO TABS
40.0000 mg | ORAL_TABLET | Freq: Two times a day (BID) | ORAL | Status: DC
  Administered 2017-07-28: 40 mg via ORAL
  Filled 2017-07-28 (×2): qty 1

## 2017-07-28 NOTE — Clinical Social Work Note (Signed)
Patient advised that he does not get food out of a trash can. He stated that he is able to prepare food independently without assistance and has enough food.     Teffany Blaszczyk, Juleen ChinaHeather D, LCSW

## 2017-07-28 NOTE — Progress Notes (Signed)
Patient ID: Vincent [name], male   DOB: 22-Apr-1928, 83 y.o.   MRN: [id number]    PROGRESS NOTE    SATISH HAMMERS  JYN:[id number] DOB: Sep 04, 1928 DOA: 07/26/2017  PCP: Samuel Jester, DO   Brief Narrative:  Patient is 83 year old male with known history of chronic diastolic CHF, presented from home with main concern of several days duration of progressively worsening dyspnea that initially started with exertion and has progressed to dyspnea at rest. This was associated with lower extremity swelling, 3 pillow orthopnea.  Assessment & Plan:   Principal Problem:   Acute on chronic diastolic CHF (congestive heart failure) (HCC) - Patient is clinically improving, currently on Lasix 40 mg IV twice a day - Echocardiogram notable for vigorous systolic function, EF 65%, unable to adequately evaluate diastolic function due to atrial fibrillation - this is the weight trend in the past 72 hours: Filed Weights   07/26/17 1645 07/27/17 1500 07/28/17 0625  Weight: 92 kg (202 lb 13.2 oz) 92.2 kg (203 lb 4.2 oz) 88.5 kg (195 lb 3.2 oz)  - we'll continue to monitor daily weights, strict intake and urine output - Plan to change Lasix to oral  - Outpatient follow-up with cardiology  Active Problems:   Essential hypertension - Systolic blood pressure in 100's this morning - Patient is currently on lisinopril 5 mg by mouth daily, metoprolol 12.5 mg daily, Norvasc 5 mg daily, Lasix 40 mg IV twice a day - I will hold lisinopril and Norvasc today so that blood pressure can appropriately stabilize - Once we change Lasix to by mouth, it may be reasonable to resume lisinopril and Norvasc if blood pressure is stable    ATRIAL FIBRILLATION, CHRONIC - Has been on metoprolol and digoxin - We'll continue same regimen for now - If heart rate drops to less than 55 beats per minute, will need to hold beta blocker as well    BPH (benign prostatic hyperplasia) - Continue home regimen medications   Dementia - Son at bedside confirms that patient is at baseline mental status this morning    Hyponatremia - Mild, likely in the setting of Lasix use - Monitor closely and repeat BMP in the morning    Thrombocytopenia - Mild, will repeat CBC in the morning   DVT prophylaxis: Lovenox subcutaneous Code Status: Full Family Communication: Patient at bedside, son at bedside Disposition Plan: Home in the morning  Consultants:   None  Procedures:   None  Antimicrobials:   None  Subjective: Patient reports feeling better, still with exertional dyspnea.  Objective: Vitals:   07/27/17 1500 07/27/17 1945 07/28/17 0625 07/28/17 0741  BP:   (!) 100/48   Pulse:   (!) 52   Resp:      Temp:   98.6 F (37 C)   TempSrc:   Oral   SpO2:  93% 94% 96%  Weight: 92.2 kg (203 lb 4.2 oz)  88.5 kg (195 lb 3.2 oz)   Height:        Intake/Output Summary (Last 24 hours) at 07/28/17 1226 Last data filed at 07/28/17 0600  Gross per 24 hour  Intake              483 ml  Output             2450 ml  Net            -1967 ml   Filed Weights   07/26/17 1645 07/27/17 1500 07/28/17 0625  Weight: 92 kg (  202 lb 13.2 oz) 92.2 kg (203 lb 4.2 oz) 88.5 kg (195 lb 3.2 oz)    Examination:  General exam: Appears calm and comfortable  Respiratory system: Respiratory effort is stable, crackles at bases noted Cardiovascular system: Irregular rate and rhythm, heart rate in 50s, +1-2 bilateral lower extremity edema Gastrointestinal system: Abdomen is nondistended, soft and nontender. No organomegaly or masses felt. Normal bowel sounds heard. Central nervous system: Alert, oriented to name, follows commands appropriately, hard of hearing. Extremities: Symmetric 5 x 5 power.  Data Reviewed: I have personally reviewed following labs and imaging studies  CBC:  Recent Labs Lab 07/26/17 0925  WBC 8.7  NEUTROABS 6.4  HGB 13.9  HCT 42.6  MCV 93.2  PLT 146*   Basic Metabolic Panel:  Recent  Labs Lab 07/26/17 0925 07/27/17 0524 07/28/17 0602  NA 137 134* 134*  K 4.4 3.9 3.6  CL 100* 94* 92*  CO2 30 33* 33*  GLUCOSE 209* 188* 182*  BUN 36* 29* 35*  CREATININE 1.00 1.13 1.22  CALCIUM 9.0 8.6* 8.6*   Lipid Profile:  Recent Labs  07/27/17 0524  CHOL 91  HDL 27*  LDLCALC 53  TRIG 56  CHOLHDL 3.4   Radiology Studies: No results found.  Scheduled Meds: . amLODipine  5 mg Oral Daily  . aspirin EC  81 mg Oral Daily  . cholecalciferol  1,000 Units Oral Daily  . digoxin  0.25 mg Oral Daily  . enoxaparin (LOVENOX) injection  40 mg Subcutaneous Q24H  . finasteride  5 mg Oral Daily  . furosemide  40 mg Intravenous Q12H  . lisinopril  5 mg Oral Daily  . metoprolol succinate  12.5 mg Oral Daily  . mometasone-formoterol  2 puff Inhalation BID  . oxybutynin  5 mg Oral BID  . simvastatin  20 mg Oral q1800  . sodium chloride flush  3 mL Intravenous Q12H  . tamsulosin  0.4 mg Oral Daily   Continuous Infusions: . sodium chloride       LOS: 2 days   Time spent: 25 minutes   Debbora PrestoIskra Magick-Hortense Cantrall, MD [facility] Pager [phone]  If 7PM-7AM, please contact night-coverage [email or web address] Password [email or web address] 07/28/2017, 12:26 PM

## 2017-07-29 LAB — CBC
HEMATOCRIT: 43.3 % (ref 39.0–52.0)
HEMOGLOBIN: 14.1 g/dL (ref 13.0–17.0)
MCH: 30.1 pg (ref 26.0–34.0)
MCHC: 32.6 g/dL (ref 30.0–36.0)
MCV: 92.5 fL (ref 78.0–100.0)
Platelets: 151 10*3/uL (ref 150–400)
RBC: 4.68 MIL/uL (ref 4.22–5.81)
RDW: 14.4 % (ref 11.5–15.5)
WBC: 7.1 10*3/uL (ref 4.0–10.5)

## 2017-07-29 LAB — BASIC METABOLIC PANEL
ANION GAP: 8 (ref 5–15)
BUN: 42 mg/dL — ABNORMAL HIGH (ref 6–20)
CHLORIDE: 92 mmol/L — AB (ref 101–111)
CO2: 34 mmol/L — AB (ref 22–32)
Calcium: 8.3 mg/dL — ABNORMAL LOW (ref 8.9–10.3)
Creatinine, Ser: 1.45 mg/dL — ABNORMAL HIGH (ref 0.61–1.24)
GFR calc non Af Amer: 42 mL/min — ABNORMAL LOW (ref >60)
GFR, EST AFRICAN AMERICAN: 48 mL/min — AB (ref >60)
Glucose, Bld: 184 mg/dL — ABNORMAL HIGH (ref 65–99)
Potassium: 3.7 mmol/L (ref 3.5–5.1)
Sodium: 134 mmol/L — ABNORMAL LOW (ref 135–145)

## 2017-07-29 NOTE — Progress Notes (Signed)
Patient ID: Vincent [name], male   DOB: 07-03-1928, 83 y.o.   MRN: [id number]    PROGRESS NOTE    Vincent [name]  WUJ:[id number] DOB: 20-Apr-1928 DOA: 07/26/2017  PCP: Samuel Jester, DO   Brief Narrative:  Patient is 83 year old male with known history of chronic diastolic CHF, presented from home with main concern of several days duration of progressively worsening dyspnea that initially started with exertion and has progressed to dyspnea at rest. This was associated with lower extremity swelling, 3 pillow orthopnea.  Assessment & Plan:   Principal Problem:   Acute on chronic diastolic CHF (congestive heart failure) (HCC) - Patient is clinically improving, was treated with Lasix 40 mg IV twice a day - Echocardiogram notable for vigorous systolic function, EF 65%, unable to adequately evaluate diastolic function due to atrial fibrillation - this is the weight trend in the past 72 hours: Filed Weights   07/27/17 1500 07/28/17 0625 07/29/17 0557  Weight: 92.2 kg (203 lb 4.2 oz) 88.5 kg (195 lb 3.2 oz) 90.1 kg (198 lb 10.2 oz)  - Due to low blood pressure, systolic and 90s, will hold Lasix today and monitor clinical status - Please note that Lasix IV was changed to by mouth form on 07/28/2017 - Outpatient follow-up with cardiology recommended - Depending on blood pressure control, may be able to discharge patient on low-dose of Lasix 40 mg once daily  Active Problems:   Essential hypertension - Systolic blood pressure still in 90s this morning - We have held lisinopril as well as Norvasc 07/28/2017 however blood pressure has remained low - Lasix IV was changed to by mouth form as well as noted above 9/12 - We'll hold metoprolol and Lasix today as well in an effort to stabilize blood pressure prior to initiating medications    ATRIAL FIBRILLATION, CHRONIC - Has been on metoprolol and digoxin - Heart rate down to 40s this morning - We'll continue digoxin but will hold  metoprolol until heart rate stabilizes    Increasing creatinine - In the setting of Lasix use, will hold Lasix today - BMP in the morning    BPH (benign prostatic hyperplasia) - Continue home regimen medications  - will ask urology team if they can change suprapubic cath as this was scheduled to be done 07/30/2017    Dementia - Per son who is at bedside this morning, patient is at baseline mental status    Hyponatremia - Mild, likely in the setting of Lasix use - repeat BMP in the morning    Thrombocytopenia - mild, no evidence of active bleeding - Now resolved   DVT prophylaxis: Lovenox subcutaneous Code Status: Full Family Communication: Patient at bedside, son at bedside Disposition Plan: home in the morning   Consultants:   None  Procedures:   None  Antimicrobials:   None  Subjective: Patient reports feeling better, denies dyspnea.   Objective: Vitals:   07/28/17 1955 07/28/17 2012 07/29/17 0557 07/29/17 0807  BP:  92/77 (!) 99/47   Pulse:  (!) 50 (!) 45   Resp:  20 18   Temp:  98 F (36.7 C) (!) 97.4 F (36.3 C)   TempSrc:  Oral Oral   SpO2: 95% 93% 90% 94%  Weight:   90.1 kg (198 lb 10.2 oz)   Height:        Intake/Output Summary (Last 24 hours) at 07/29/17 1029 Last data filed at 07/29/17 0606  Gross per 24 hour  Intake  200 ml  Output             2000 ml  Net            -1800 ml   Filed Weights   07/27/17 1500 07/28/17 0625 07/29/17 0557  Weight: 92.2 kg (203 lb 4.2 oz) 88.5 kg (195 lb 3.2 oz) 90.1 kg (198 lb 10.2 oz)    Physical Exam  Constitutional: Appears calm, not in acute distress, in good spirits this morning CVS: IRRR,  bradycardic with heart rate in 40s Pulmonary: Effort and breath sounds normal, mild rhonchi at bases  Abdominal: Soft. BS +,  no distension, tenderness, rebound or guarding.  Musculoskeletal: Normal range of motion. +1 bilateral lower extremity edema  Lymphadenopathy: No lymphadenopathy noted,  cervical, inguinal. Neuro: Alert. Normal reflexes, muscle tone coordination. No cranial nerve deficit. Psychiatric: Normal mood and affect.   Data Reviewed: I have personally reviewed following labs and imaging studies  CBC:  Recent Labs Lab 07/26/17 0925 07/29/17 0524  WBC 8.7 7.1  NEUTROABS 6.4  --   HGB 13.9 14.1  HCT 42.6 43.3  MCV 93.2 92.5  PLT 146* 151   Basic Metabolic Panel:  Recent Labs Lab 07/26/17 0925 07/27/17 0524 07/28/17 0602 07/29/17 0524  NA 137 134* 134* 134*  K 4.4 3.9 3.6 3.7  CL 100* 94* 92* 92*  CO2 30 33* 33* 34*  GLUCOSE 209* 188* 182* 184*  BUN 36* 29* 35* 42*  CREATININE 1.00 1.13 1.22 1.45*  CALCIUM 9.0 8.6* 8.6* 8.3*   Lipid Profile:  Recent Labs  07/27/17 0524  CHOL 91  HDL 27*  LDLCALC 53  TRIG 56  CHOLHDL 3.4   Radiology Studies: No results found.  Scheduled Meds: . aspirin EC  81 mg Oral Daily  . cholecalciferol  1,000 Units Oral Daily  . digoxin  0.25 mg Oral Daily  . enoxaparin (LOVENOX) injection  40 mg Subcutaneous Q24H  . finasteride  5 mg Oral Daily  . mometasone-formoterol  2 puff Inhalation BID  . oxybutynin  5 mg Oral BID  . simvastatin  20 mg Oral q1800  . sodium chloride flush  3 mL Intravenous Q12H  . tamsulosin  0.4 mg Oral Daily   Continuous Infusions: . sodium chloride       LOS: 3 days   Time spent: 25 minutes   Debbora PrestoIskra Magick-Vontrell Pullman, MD [facility] Pager [phone]  If 7PM-7AM, please contact night-coverage [email or web address] Password [email or web address] 07/29/2017, 10:29 AM

## 2017-07-29 NOTE — Progress Notes (Signed)
Dr. Melynda RippleHobbs paged and made aware of pts HR decreasing to low 30s (32) and being Afib. Waiting for orders/call back. Pt asymptomatic

## 2017-07-30 ENCOUNTER — Ambulatory Visit: Payer: Medicare Other | Admitting: Urology

## 2017-07-30 DIAGNOSIS — N401 Enlarged prostate with lower urinary tract symptoms: Secondary | ICD-10-CM

## 2017-07-30 DIAGNOSIS — R338 Other retention of urine: Secondary | ICD-10-CM

## 2017-07-30 DIAGNOSIS — Z435 Encounter for attention to cystostomy: Secondary | ICD-10-CM

## 2017-07-30 LAB — CBC
HCT: 44.5 % (ref 39.0–52.0)
Hemoglobin: 14.6 g/dL (ref 13.0–17.0)
MCH: 30.3 pg (ref 26.0–34.0)
MCHC: 32.8 g/dL (ref 30.0–36.0)
MCV: 92.3 fL (ref 78.0–100.0)
PLATELETS: 150 10*3/uL (ref 150–400)
RBC: 4.82 MIL/uL (ref 4.22–5.81)
RDW: 14.2 % (ref 11.5–15.5)
WBC: 6.7 10*3/uL (ref 4.0–10.5)

## 2017-07-30 LAB — BASIC METABOLIC PANEL
ANION GAP: 8 (ref 5–15)
BUN: 38 mg/dL — AB (ref 6–20)
CO2: 30 mmol/L (ref 22–32)
Calcium: 8.7 mg/dL — ABNORMAL LOW (ref 8.9–10.3)
Chloride: 97 mmol/L — ABNORMAL LOW (ref 101–111)
Creatinine, Ser: 1.11 mg/dL (ref 0.61–1.24)
GFR calc Af Amer: >60 mL/min (ref >60)
GFR calc non Af Amer: 58 mL/min — ABNORMAL LOW (ref >60)
GLUCOSE: 176 mg/dL — AB (ref 65–99)
POTASSIUM: 4.5 mmol/L (ref 3.5–5.1)
Sodium: 135 mmol/L (ref 135–145)

## 2017-07-30 MED ORDER — FUROSEMIDE 40 MG PO TABS: 40.0000 mg | ORAL_TABLET | Freq: Two times a day (BID) | ORAL | Status: DC

## 2017-07-30 MED ORDER — DIGOXIN 125 MCG PO TABS
0.1250 mg | ORAL_TABLET | Freq: Every day | ORAL | Status: DC
  Administered 2017-07-31 – 2017-08-02 (×3): 0.125 mg via ORAL
  Filled 2017-07-30 (×3): qty 1

## 2017-07-30 MED ORDER — FUROSEMIDE 40 MG PO TABS
40.0000 mg | ORAL_TABLET | Freq: Every day | ORAL | Status: DC
  Administered 2017-07-30 – 2017-07-31 (×2): 40 mg via ORAL
  Filled 2017-07-30 (×3): qty 1

## 2017-07-30 NOTE — Consult Note (Signed)
Urology Consult  Referring physician: Dr. Doyle Askew Reason for referral: SP tube change  Chief Complaint: urinary retnetion  History of Present Illness: Vincent [name] is a 83yo with a hx of BPH and chronic urinary retention managed with SP tube. He was admitted with CHF exacerbation. He was scheduled to have his SP tube changed 9/14 but he was unable to make the appointment due to hospitalization. Currently he denies any supapubic pain, no fevers, no leakage around the catheter. He has a 24 french SP tube that is normally changed by Dr. Jeffie Pollock  Past Medical History:  Diagnosis Date  . Arachnoid cyst   . Atrial fibrillation ([place])   . CHF (congestive heart failure) ([place])   . Dementia   . Hypertension   . Lumbar spondylosis   . Prostate hypertrophy   . Sepsis ([place])   . Urine retention   . Urine retention   . UTI (lower urinary tract infection)    Past Surgical History:  Procedure Laterality Date  . renal abscess drainage    . SUPRAPUBIC CATHETER PLACEMENT    . TRANSURETHRAL RESECTION OF PROSTATE      Medications: I have reviewed the patient's current medications. Allergies:  Allergies  Allergen Reactions  . Hytrin [Terazosin] Other (See Comments)    Allergies per [facility]  . Simvastatin Other (See Comments)    Allergy per [facility]    Family History  Problem Relation Age of Onset  . Family history unknown: Yes   Social History:  reports that he has quit smoking. His smoking use included Cigarettes. He has never used smokeless tobacco. He reports that he does not drink alcohol or use drugs.  Review of Systems  Constitutional: Positive for malaise/fatigue.  Respiratory: Positive for shortness of breath.   Neurological: Positive for weakness.  All other systems reviewed and are negative.   Physical Exam:  Vital signs in last 24 hours: Temp:  [97.5 F (36.4 C)-98.1 F (36.7 C)] 98.1 F (36.7 C) (09/14 1432) Pulse Rate:  [44-66] 62 (09/14 1432) Resp:  [18]  18 (09/14 1432) BP: (118-130)/(53-59) 120/53 (09/14 1432) SpO2:  [92 %-96 %] 94 % (09/14 1432) Weight:  [88.9 kg (195 lb 15.8 oz)] 88.9 kg (195 lb 15.8 oz) (09/14 0500) Physical Exam  Constitutional: He is oriented to person, place, and time. He appears well-developed and well-nourished.  HENT:  Head: Normocephalic and atraumatic.  Eyes: Pupils are equal, round, and reactive to light. EOM are normal.  Neck: Normal range of motion. No thyromegaly present.  Cardiovascular: Normal rate and regular rhythm.   Respiratory: Effort normal. No respiratory distress.  GI: Soft. He exhibits no distension.  Genitourinary: Penis normal.  Musculoskeletal: Normal range of motion. He exhibits no edema.  Neurological: He is alert and oriented to person, place, and time.  Skin: Skin is warm and dry.  Psychiatric: He has a normal mood and affect. His behavior is normal. Judgment and thought content normal.    Laboratory Data:  Results for orders placed or performed during the hospital encounter of 07/26/17 (from the past 72 hour(s))  Basic metabolic panel     Status: Abnormal   Collection Time: 07/28/17  6:02 AM  Result Value Ref Range   Sodium 134 (L) 135 - 145 mmol/L   Potassium 3.6 3.5 - 5.1 mmol/L   Chloride 92 (L) 101 - 111 mmol/L   CO2 33 (H) 22 - 32 mmol/L   Glucose, Bld 182 (H) 65 - 99 mg/dL   BUN  35 (H) 6 - 20 mg/dL   Creatinine, Ser 1.22 0.61 - 1.24 mg/dL   Calcium 8.6 (L) 8.9 - 10.3 mg/dL   GFR calc non Af Amer 51 (L) >60 mL/min   GFR calc Af Amer 60 (L) >60 mL/min    Comment: (NOTE) The eGFR has been calculated using the CKD EPI equation. This calculation has not been validated in all clinical situations. eGFR's persistently <60 mL/min signify possible Chronic Kidney Disease.    Anion gap 9 5 - 15  Basic metabolic panel     Status: Abnormal   Collection Time: 07/29/17  5:24 AM  Result Value Ref Range   Sodium 134 (L) 135 - 145 mmol/L   Potassium 3.7 3.5 - 5.1 mmol/L   Chloride  92 (L) 101 - 111 mmol/L   CO2 34 (H) 22 - 32 mmol/L   Glucose, Bld 184 (H) 65 - 99 mg/dL   BUN 42 (H) 6 - 20 mg/dL   Creatinine, Ser 1.45 (H) 0.61 - 1.24 mg/dL   Calcium 8.3 (L) 8.9 - 10.3 mg/dL   GFR calc non Af Amer 42 (L) >60 mL/min   GFR calc Af Amer 48 (L) >60 mL/min    Comment: (NOTE) The eGFR has been calculated using the CKD EPI equation. This calculation has not been validated in all clinical situations. eGFR's persistently <60 mL/min signify possible Chronic Kidney Disease.    Anion gap 8 5 - 15  CBC     Status: None   Collection Time: 07/29/17  5:24 AM  Result Value Ref Range   WBC 7.1 4.0 - 10.5 K/uL   RBC 4.68 4.22 - 5.81 MIL/uL   Hemoglobin 14.1 13.0 - 17.0 g/dL   HCT 43.3 39.0 - 52.0 %   MCV 92.5 78.0 - 100.0 fL   MCH 30.1 26.0 - 34.0 pg   MCHC 32.6 30.0 - 36.0 g/dL   RDW 14.4 11.5 - 15.5 %   Platelets 151 150 - 400 K/uL  Basic metabolic panel     Status: Abnormal   Collection Time: 07/30/17  5:23 AM  Result Value Ref Range   Sodium 135 135 - 145 mmol/L   Potassium 4.5 3.5 - 5.1 mmol/L    Comment: DELTA CHECK NOTED   Chloride 97 (L) 101 - 111 mmol/L   CO2 30 22 - 32 mmol/L   Glucose, Bld 176 (H) 65 - 99 mg/dL   BUN 38 (H) 6 - 20 mg/dL   Creatinine, Ser 1.11 0.61 - 1.24 mg/dL   Calcium 8.7 (L) 8.9 - 10.3 mg/dL   GFR calc non Af Amer 58 (L) >60 mL/min   GFR calc Af Amer >60 >60 mL/min    Comment: (NOTE) The eGFR has been calculated using the CKD EPI equation. This calculation has not been validated in all clinical situations. eGFR's persistently <60 mL/min signify possible Chronic Kidney Disease.    Anion gap 8 5 - 15  CBC     Status: None   Collection Time: 07/30/17  5:23 AM  Result Value Ref Range   WBC 6.7 4.0 - 10.5 K/uL   RBC 4.82 4.22 - 5.81 MIL/uL   Hemoglobin 14.6 13.0 - 17.0 g/dL   HCT 44.5 39.0 - 52.0 %   MCV 92.3 78.0 - 100.0 fL   MCH 30.3 26.0 - 34.0 pg   MCHC 32.8 30.0 - 36.0 g/dL   RDW 14.2 11.5 - 15.5 %   Platelets 150 150 - 400  K/uL  No results found for this or any previous visit (from the past 240 hour(s)). Creatinine:  Recent Labs  07/26/17 0925 07/27/17 0524 07/28/17 0602 07/29/17 0524 07/30/17 0523  CREATININE 1.00 1.13 1.22 1.45* 1.11   Baseline Creatinine: 1  Impression/Assessment:  83yo with BPH and urinary retention managed with SP tube  Plan:  1. SP tube changed with 24 french foley and balloon was inflated with 10cc of water. He should followup with [facility] in 1 month for SP tube change.  Nicolette Bang 07/30/2017, 4:00 PM

## 2017-07-30 NOTE — Progress Notes (Signed)
Patient ID: Vincent [name], male   DOB: 07/18/1928, 83 y.o.   MRN: [id number]    PROGRESS NOTE   ASIER DESROCHES  WUJ:[id number] DOB: [birth year] DOA: 07/26/2017  PCP: Samuel Jester, DO   Brief Narrative:  Patient is 83 year old male with known history of chronic diastolic CHF, presented from home with main concern of several days duration of progressively worsening dyspnea that initially started with exertion and has progressed to dyspnea at rest. This was associated with lower extremity swelling, 3 pillow orthopnea.  Assessment & Plan:   Principal Problem:   Acute on chronic diastolic CHF (congestive heart failure) (HCC) - Patient is clinically improving, was treated with Lasix 40 mg IV twice a day, was changed to PO form on 9/12 - Echocardiogram notable for vigorous systolic function, EF 65%, unable to adequately evaluate diastolic function due to atrial fibrillation - this is the weight trend in the past 72 hours: Filed Weights   07/28/17 0625 07/29/17 0557 07/30/17 0500  Weight: 88.5 kg (195 lb 3.2 oz) 90.1 kg (198 lb 10.2 oz) 88.9 kg (195 lb 15.8 oz)  - Due to low blood pressure, systolic and 90s, lasix was held since 9/13 - BP has stabilized and will try adding 40 mg PO Lasix, monitor clinical status  - if further blood pressure drops, can lower the dose to 20 mg PO QD   Active Problems:   Essential hypertension - We have held lisinopril as well as Norvasc 07/28/2017 due to low BP - Lasix IV was changed to by mouth form as noted above 9/12 - on 9/13, Lasix and Metoprolol also held due to persistent hypotension and bradycardia - BP has now stabilized and will initiate low dose lasix 40 mg PO BID  - monitor vital signs closely     ATRIAL FIBRILLATION, CHRONIC - Has been on metoprolol and digoxin - Metoprolol held since 9/13 - lower the dose of Digoxin from 0.25 to 0.125 mg (this was discussed with cardiologist on call) - continue to hold Metoprolol     Increasing  creatinine - In the setting of Lasix use, Lasix help on 9/13 - Cr is now WNL - BMP in AM     BPH (benign prostatic hyperplasia) - Continue home regimen medications  - urology to see today to change cath     Dementia - Per son who is at bedside this morning, patient is at baseline mental status    Hyponatremia - Mild, likely in the setting of Lasix use - improving - BMP in AM    Thrombocytopenia - mild, no evidence of active bleeding - Now resolved   DVT prophylaxis: Lovenox subcutaneous Code Status: Full Family Communication: Patient at bedside, son at bedside Disposition Plan: home in the morning   Consultants:   None  Procedures:   None  Antimicrobials:   None  Subjective: Patient reports feeling better, denies dyspnea.   Objective: Vitals:   07/30/17 0500 07/30/17 0625 07/30/17 0801 07/30/17 1046  BP:  (!) 118/59    Pulse:  (!) 48  (!) 44  Resp:  18    Temp:  (!) 97.5 F (36.4 C)    TempSrc:  Oral    SpO2:  93% 93%   Weight: 88.9 kg (195 lb 15.8 oz)     Height:        Intake/Output Summary (Last 24 hours) at 07/30/17 1302 Last data filed at 07/30/17 1046  Gross per 24 hour  Intake  683 ml  Output              800 ml  Net             -117 ml   Filed Weights   07/28/17 0625 07/29/17 0557 07/30/17 0500  Weight: 88.5 kg (195 lb 3.2 oz) 90.1 kg (198 lb 10.2 oz) 88.9 kg (195 lb 15.8 oz)    Physical Exam  Constitutional: Appears pleasant and calm, NAD CVS: IRRR, bradycardic  Pulmonary: Effort and breath sounds normal, diminished breath sounds at bases  Abdominal: Soft. BS +,  no distension, tenderness, rebound or guarding.  Musculoskeletal: Normal range of motion. +1 bilateral LE edema   Data Reviewed: I have personally reviewed following labs and imaging studies  CBC:  Recent Labs Lab 07/26/17 0925 07/29/17 0524 07/30/17 0523  WBC 8.7 7.1 6.7  NEUTROABS 6.4  --   --   HGB 13.9 14.1 14.6  HCT 42.6 43.3 44.5  MCV 93.2 92.5  92.3  PLT 146* 151 150   Basic Metabolic Panel:  Recent Labs Lab 07/26/17 0925 07/27/17 0524 07/28/17 0602 07/29/17 0524 07/30/17 0523  NA 137 134* 134* 134* 135  K 4.4 3.9 3.6 3.7 4.5  CL 100* 94* 92* 92* 97*  CO2 30 33* 33* 34* 30  GLUCOSE 209* 188* 182* 184* 176*  BUN 36* 29* 35* 42* 38*  CREATININE 1.00 1.13 1.22 1.45* 1.11  CALCIUM 9.0 8.6* 8.6* 8.3* 8.7*   Radiology Studies: No results found.  Scheduled Meds: . aspirin EC  81 mg Oral Daily  . cholecalciferol  1,000 Units Oral Daily  . [START ON 07/31/2017] digoxin  0.125 mg Oral Daily  . enoxaparin (LOVENOX) injection  40 mg Subcutaneous Q24H  . finasteride  5 mg Oral Daily  . furosemide  40 mg Oral Daily  . mometasone-formoterol  2 puff Inhalation BID  . oxybutynin  5 mg Oral BID  . simvastatin  20 mg Oral q1800  . sodium chloride flush  3 mL Intravenous Q12H  . tamsulosin  0.4 mg Oral Daily   Continuous Infusions: . sodium chloride      LOS: 4 days   Time spent: 25 minutes   Debbora Presto, MD [facility] Pager [phone]  If 7PM-7AM, please contact night-coverage [email or web address] Password [email or web address] 07/30/2017, 1:02 PM

## 2017-07-30 NOTE — Progress Notes (Signed)
Per CCMD, pts HR decreased to 28. Dr. Melynda Ripple paged and made aware. Will continue to monitor pt

## 2017-07-30 NOTE — Evaluation (Signed)
Physical Therapy Evaluation Patient Details Name: Vincent [name] MRN: [id number] DOB: [birth year] Today's Date: 07/30/2017   History of Present Illness  Vincent [name] is a 83 y.o. male with history of chronic diastolic CHF, atrial fibrillation, dementia, hypertension   Clinical Impression  Mr. Seago is a pleasant gentleman.  He appears to have some cognitive deficits as he asks the therapist the same questions over and over again .  He has minimal deficits in his mobility and is not in need of skilled therapy at this time.     Follow Up Recommendations No PT follow up;Supervision/Assistance - 24 hour (due to declining mental status. )    Equipment Recommendations  None recommended by PT    Recommendations for Other Services       Precautions / Restrictions Precautions Precautions: None Restrictions Weight Bearing Restrictions: No      Mobility  Bed Mobility Overal bed mobility: Modified Independent                Transfers Overall transfer level: Modified independent Equipment used: None                Ambulation/Gait Ambulation/Gait assistance: Modified independent (Device/Increase time) Ambulation Distance (Feet): 50 Feet (25 ft with no assistive device; 25 witn walker) Assistive device: Rolling walker (2 wheeled);None Gait Pattern/deviations: Trunk flexed;Decreased step length - right;Decreased step length - left   Gait velocity interpretation: at or above normal speed for age/gender General Gait Details: Pt refused to go any further stating that he was 89 and that was all he was going to do.            Pertinent Vitals/Pain Pain Assessment: No/denies pain    Home Living Family/patient expects to be discharged to:: Private residence Living Arrangements: Alone Available Help at Discharge: Family Type of Home: House Home Access: Level entry;Stairs to enter   Entrance Stairs-Number of Steps: 3 Home Layout: One level Home  Equipment: Cane - single point      Prior Function Level of Independence: Independent with assistive device(s)               Hand Dominance        Extremity/Trunk Assessment        Lower Extremity Assessment Lower Extremity Assessment: Overall WFL for tasks assessed       Communication   Communication: HOH  Cognition Arousal/Alertness: Awake/alert Behavior During Therapy: WFL for tasks assessed/performed Overall Cognitive Status: History of cognitive impairments - at baseline                                               Assessment/Plan    PT Assessment Patent does not need any further PT services  PT Problem List                                            End of Session Equipment Utilized During Treatment: Gait belt Activity Tolerance: Patient tolerated treatment well Patient left: in bed;with call bell/phone within reach;with bed alarm set   PT Visit Diagnosis: Unsteadiness on feet (R26.81)    Time: 1520-1540 PT Time Calculation (min) (ACUTE ONLY): 20 min   Charges:   PT Evaluation $PT Eval Low Complexity: 1 Low  Virgina Organ, PT CLT [phone] 07/30/2017, 3:50 PM

## 2017-07-31 LAB — CBC
HCT: 45.5 % (ref 39.0–52.0)
HEMOGLOBIN: 15 g/dL (ref 13.0–17.0)
MCH: 30.6 pg (ref 26.0–34.0)
MCHC: 33 g/dL (ref 30.0–36.0)
MCV: 92.9 fL (ref 78.0–100.0)
PLATELETS: 156 10*3/uL (ref 150–400)
RBC: 4.9 MIL/uL (ref 4.22–5.81)
RDW: 14.2 % (ref 11.5–15.5)
WBC: 7.7 10*3/uL (ref 4.0–10.5)

## 2017-07-31 LAB — BASIC METABOLIC PANEL
Anion gap: 9 (ref 5–15)
BUN: 32 mg/dL — ABNORMAL HIGH (ref 6–20)
CALCIUM: 8.9 mg/dL (ref 8.9–10.3)
CO2: 29 mmol/L (ref 22–32)
CREATININE: 1.12 mg/dL (ref 0.61–1.24)
Chloride: 96 mmol/L — ABNORMAL LOW (ref 101–111)
GFR calc Af Amer: >60 mL/min (ref >60)
GFR calc non Af Amer: 57 mL/min — ABNORMAL LOW (ref >60)
GLUCOSE: 199 mg/dL — AB (ref 65–99)
Potassium: 4.1 mmol/L (ref 3.5–5.1)
Sodium: 134 mmol/L — ABNORMAL LOW (ref 135–145)

## 2017-07-31 NOTE — Progress Notes (Signed)
Patient ID: Vincent [name], male   DOB: 11/12/1928, 83 y.o.   MRN: [id number]    PROGRESS NOTE   Vincent [name]  [id number]:[id number] DOB: 1928-06-23 DOA: 07/26/2017  PCP: Samuel Jester, DO   Brief Narrative:  Patient is 83 year old male with known history of chronic diastolic CHF, presented from home with main concern of several days duration of progressively worsening dyspnea that initially started with exertion and has progressed to dyspnea at rest. This was associated with lower extremity swelling, 3 pillow orthopnea.  Assessment & Plan:   Principal Problem:   Acute on chronic diastolic CHF (congestive heart failure) (HCC) - Patient is clinically improving, was treated with Lasix 40 mg IV twice a day, was changed to PO form on 9/12 - Echocardiogram notable for vigorous systolic function, EF 65%, unable to adequately evaluate diastolic function due to atrial fibrillation - this is the weight trend in the past 72 hours: Filed Weights   07/29/17 0557 07/30/17 0500 07/31/17 5784  Weight: 90.1 kg (198 lb 10.2 oz) 88.9 kg (195 lb 15.8 oz) 88.1 kg (194 lb 4.8 oz)  - Due to low blood pressure, systolic and 90s, lasix was held since 9/13 - BP has stabilized And we added Lasix 40 mg by mouth daily 9/14 - SBP in 120-130's in the past 24 hours   Active Problems:   Essential hypertension - We have held lisinopril as well as Norvasc 07/28/2017 due to low BP - Lasix IV was changed to by mouth form as noted above 9/12 - on 9/13, Lasix and Metoprolol also held due to persistent hypotension and bradycardia - As blood pressure stabilizes, Lasix 40 mg by mouth daily added back on the medical regimen on September 14 - Continue to monitor vital signs    ATRIAL FIBRILLATION, CHRONIC - Has been on metoprolol and digoxin - Metoprolol held since 9/13 - lowered the dose of Digoxin from 0.25 to 0.125 mg (this was discussed with cardiologist on call) - Heart rate now in 50s, continue to hold  metoprolol    Increasing creatinine - In the setting of Lasix use, Lasix help on 9/13 - Creatinine remains within normal limits    BPH (benign prostatic hyperplasia) - Continue home regimen medications  - Appreciate urology assistance, catheter changed    Dementia - Per son who is at bedside this morning, patient is at baseline mental status    Hyponatremia - Mild, likely in the setting of Lasix use - Continue to monitor while inpatient    Thrombocytopenia - mild, no evidence of active bleeding - Now resolved   DVT prophylaxis: Lovenox subcutaneous Code Status: Full Family Communication: Patient at bedside, son at bedside Disposition Plan: Continue to monitor patient on current medical regimen, suspect patient can be discharge in one to 2 days if clinically stable and blood work stable.  Consultants:   Urology, asked to change suprapubic cath  Procedures:   None  Antimicrobials:   None  Subjective: Patient reports feeling better  Objective: Vitals:   07/30/17 2020 07/31/17 0204 07/31/17 0638 07/31/17 0733  BP:  125/64 (!) 134/57   Pulse:  69 (!) 56   Resp:  18 20   Temp:  97.8 F (36.6 C) 97.6 F (36.4 C)   TempSrc:  Oral Oral   SpO2: 93% 98% 92% 94%  Weight:   88.1 kg (194 lb 4.8 oz)   Height:        Intake/Output Summary (Last 24 hours) at 07/31/17 1024 Last data  filed at 07/31/17 0500  Gross per 24 hour  Intake              483 ml  Output             2250 ml  Net            -1767 ml   Filed Weights   07/29/17 0557 07/30/17 0500 07/31/17 1610  Weight: 90.1 kg (198 lb 10.2 oz) 88.9 kg (195 lb 15.8 oz) 88.1 kg (194 lb 4.8 oz)    Physical Exam  Constitutional: Appears Calm and pleasant, not in acute distress CVS: Irregular rhythm and rate, bradycardic Pulmonary: Mild rhonchi's at bases, diminished air movement at bases Abdominal: Soft. BS +,  no distension, tenderness, rebound or guarding.  Musculoskeletal: Normal range of motion. +1 bilateral  lower extremity edema   Data Reviewed: I have personally reviewed following labs and imaging studies  CBC:  Recent Labs Lab 07/26/17 0925 07/29/17 0524 07/30/17 0523 07/31/17 0827  WBC 8.7 7.1 6.7 7.7  NEUTROABS 6.4  --   --   --   HGB 13.9 14.1 14.6 15.0  HCT 42.6 43.3 44.5 45.5  MCV 93.2 92.5 92.3 92.9  PLT 146* 151 150 156   Basic Metabolic Panel:  Recent Labs Lab 07/27/17 0524 07/28/17 0602 07/29/17 0524 07/30/17 0523 07/31/17 0827  NA 134* 134* 134* 135 134*  K 3.9 3.6 3.7 4.5 4.1  CL 94* 92* 92* 97* 96*  CO2 33* 33* 34* 30 29  GLUCOSE 188* 182* 184* 176* 199*  BUN 29* 35* 42* 38* 32*  CREATININE 1.13 1.22 1.45* 1.11 1.12  CALCIUM 8.6* 8.6* 8.3* 8.7* 8.9   Radiology Studies: No results found.  Scheduled Meds: . aspirin EC  81 mg Oral Daily  . cholecalciferol  1,000 Units Oral Daily  . digoxin  0.125 mg Oral Daily  . enoxaparin (LOVENOX) injection  40 mg Subcutaneous Q24H  . finasteride  5 mg Oral Daily  . furosemide  40 mg Oral Daily  . mometasone-formoterol  2 puff Inhalation BID  . oxybutynin  5 mg Oral BID  . simvastatin  20 mg Oral q1800  . sodium chloride flush  3 mL Intravenous Q12H  . tamsulosin  0.4 mg Oral Daily   Continuous Infusions: . sodium chloride      LOS: 5 days   Time spent: 25 minutes   Debbora Presto, MD [facility] Pager [phone]  If 7PM-7AM, please contact night-coverage [email or web address] Password [facility] 07/31/2017, 10:24 AM

## 2017-08-01 ENCOUNTER — Inpatient Hospital Stay (HOSPITAL_COMMUNITY): Payer: Medicare Other

## 2017-08-01 MED ORDER — FUROSEMIDE 10 MG/ML IJ SOLN
40.0000 mg | Freq: Two times a day (BID) | INTRAMUSCULAR | Status: AC
  Administered 2017-08-01 – 2017-08-02 (×2): 40 mg via INTRAVENOUS
  Filled 2017-08-01 (×2): qty 4

## 2017-08-01 MED ORDER — FUROSEMIDE 10 MG/ML IJ SOLN
40.0000 mg | Freq: Two times a day (BID) | INTRAMUSCULAR | Status: DC
  Administered 2017-08-01: 40 mg via INTRAVENOUS
  Filled 2017-08-01: qty 4

## 2017-08-01 NOTE — Progress Notes (Signed)
Patient refused blood draws this am.

## 2017-08-01 NOTE — Progress Notes (Signed)
Patient ID: Vincent [name], male   DOB: 1927-12-04, 83 y.o.   MRN: [id number]    PROGRESS NOTE   DIONNE ROSSA  WUJ:[id number] DOB: 09-Feb-1928 DOA: 07/26/2017  PCP: Samuel Jester, DO   Brief Narrative:  Patient is 83 year old male with known history of chronic diastolic CHF, presented from home with main concern of several days duration of progressively worsening dyspnea that initially started with exertion and has progressed to dyspnea at rest. This was associated with lower extremity swelling, 3 pillow orthopnea.  Assessment & Plan:   Principal Problem:   Acute on chronic diastolic CHF (congestive heart failure) (HCC) - Patient is clinically improving, was treated with Lasix 40 mg IV twice a day, was changed to PO form on 9/12 - Echocardiogram notable for vigorous systolic function, EF 65%, unable to adequately evaluate diastolic function due to atrial fibrillation - this is the weight trend in the past 72 hours: Filed Weights   07/30/17 0500 07/31/17 0638 08/01/17 0540  Weight: 88.9 kg (195 lb 15.8 oz) 88.1 kg (194 lb 4.8 oz) 87.9 kg (193 lb 12.6 oz)  - Due to low blood pressure, systolic and 90s, lasix was held since 9/13 - BP has stabilized And we added Lasix 40 mg by mouth daily 9/14 - 08/01/2017 worsening respiratory status with more crackles and rhonchi's - Chest x-ray requested, patient started back on IV Lasix as blood pressure is now able to tolerate - Continue to monitor daily weights, strict I/O  Active Problems:   Essential hypertension - We have held lisinopril as well as Norvasc 07/28/2017 due to low BP - Lasix IV was changed to by mouth form as noted above 9/12 - on 9/13, Lasix and Metoprolol also held due to persistent hypotension and bradycardia - As blood pressure stabilized, Lasix 40 mg by mouth daily added back on the medical regimen on September 14 - Due to more crackles on exam and worsening respiratory status, Lasix IV twice a day added  08/01/2017    ATRIAL FIBRILLATION, CHRONIC - Has been on metoprolol and digoxin - Metoprolol held since 9/13 - lowered the dose of Digoxin from 0.25 to 0.125 mg (this was discussed with cardiologist on call) - Heart rate remains in 50s    Increasing creatinine - In the setting of Lasix use, Lasix help on 9/13 - Creatinine remains within normal limits    BPH (benign prostatic hyperplasia) - Continue home regimen medications  - Urology consulted, Or change, assistance appreciated    Dementia - Mental status appears to be at baseline    Hyponatremia - In the setting of Lasix use - Monitor while inpatient    Thrombocytopenia - mild and likely reactive - resolved    DVT prophylaxis: Lovenox subcutaneous Code Status: Full Family Communication: pt and son at bedside  Disposition Plan: plan d/c in next 24 hours if resp status in more stable   Consultants:   Urology, asked to change suprapubic cath  Procedures:   None  Antimicrobials:   None  Subjective: Pt reports more dyspnea this AM, more congested.   Objective: Vitals:   07/31/17 2147 08/01/17 0540 08/01/17 0928 08/01/17 1055  BP: 138/65 118/84    Pulse: (!) 50 (!) 55 64   Resp: 18 18    Temp: 98.2 F (36.8 C) 97.7 F (36.5 C)    TempSrc: Oral Oral    SpO2: 94% 93%  94%  Weight:  87.9 kg (193 lb 12.6 oz)    Height:  Intake/Output Summary (Last 24 hours) at 08/01/17 1158 Last data filed at 08/01/17 0900  Gross per 24 hour  Intake              480 ml  Output             1700 ml  Net            -1220 ml   Filed Weights   07/30/17 0500 07/31/17 0638 08/01/17 0540  Weight: 88.9 kg (195 lb 15.8 oz) 88.1 kg (194 lb 4.8 oz) 87.9 kg (193 lb 12.6 oz)   Physical Exam  Constitutional: Appears calm, NAD CVS: RRR, S1/S2 +, no gallops, no carotid bruit.  Pulmonary: crackles at bases with scattered rhonchi  Abdominal: Soft. BS +,  no distension, tenderness, rebound or guarding.  Musculoskeletal: Normal  range of motion. +1 bilateral lower extremity edema with chronic venous stasis changes   Data Reviewed: I have personally reviewed following labs and imaging studies  CBC:  Recent Labs Lab 07/26/17 0925 07/29/17 0524 07/30/17 0523 07/31/17 0827  WBC 8.7 7.1 6.7 7.7  NEUTROABS 6.4  --   --   --   HGB 13.9 14.1 14.6 15.0  HCT 42.6 43.3 44.5 45.5  MCV 93.2 92.5 92.3 92.9  PLT 146* 151 150 156   Basic Metabolic Panel:  Recent Labs Lab 07/27/17 0524 07/28/17 0602 07/29/17 0524 07/30/17 0523 07/31/17 0827  NA 134* 134* 134* 135 134*  K 3.9 3.6 3.7 4.5 4.1  CL 94* 92* 92* 97* 96*  CO2 33* 33* 34* 30 29  GLUCOSE 188* 182* 184* 176* 199*  BUN 29* 35* 42* 38* 32*  CREATININE 1.13 1.22 1.45* 1.11 1.12  CALCIUM 8.6* 8.6* 8.3* 8.7* 8.9   Radiology Studies: No results found.  Scheduled Meds: . aspirin EC  81 mg Oral Daily  . cholecalciferol  1,000 Units Oral Daily  . digoxin  0.125 mg Oral Daily  . enoxaparin (LOVENOX) injection  40 mg Subcutaneous Q24H  . finasteride  5 mg Oral Daily  . furosemide  40 mg Intravenous BID  . mometasone-formoterol  2 puff Inhalation BID  . oxybutynin  5 mg Oral BID  . simvastatin  20 mg Oral q1800  . sodium chloride flush  3 mL Intravenous Q12H  . tamsulosin  0.4 mg Oral Daily   Continuous Infusions: . sodium chloride      LOS: 6 days   Time spent: 35 minutes   Debbora Presto, MD [facility] Pager [phone]  If 7PM-7AM, please contact night-coverage [email or web address] Password [email or web address] 08/01/2017, 11:58 AM

## 2017-08-02 MED ORDER — FUROSEMIDE 10 MG/ML IJ SOLN
40.0000 mg | Freq: Two times a day (BID) | INTRAMUSCULAR | Status: AC
  Administered 2017-08-02 – 2017-08-03 (×2): 40 mg via INTRAVENOUS
  Filled 2017-08-02 (×2): qty 4

## 2017-08-02 NOTE — Progress Notes (Signed)
Patient ID: Vincent [name], male   DOB: 25-Sep-1928, 83 y.o.   MRN: [id number]    PROGRESS NOTE   DURAN OHERN  WUJ:[id number] DOB: 01/27/1928 DOA: 07/26/2017  PCP: Samuel Jester, DO   Brief Narrative:  Patient is 83 year old male with known history of chronic diastolic CHF, presented from home with main concern of several days duration of progressively worsening dyspnea that initially started with exertion and has progressed to dyspnea at rest. This was associated with lower extremity swelling, 3 pillow orthopnea.  Assessment & Plan:   Principal Problem:   Acute on chronic diastolic CHF (congestive heart failure) (HCC) - Patient is clinically improving, was treated with Lasix 40 mg IV twice a day, was changed to PO form on 9/12 - Echocardiogram notable for vigorous systolic function, EF 65%, unable to adequately evaluate diastolic function due to atrial fibrillation - this is the weight trend in the past 72 hours: Filed Weights   07/31/17 0638 08/01/17 0540 08/02/17 0558  Weight: 88.1 kg (194 lb 4.8 oz) 87.9 kg (193 lb 12.6 oz) 85.9 kg (189 lb 6 oz)  - Due to low blood pressure, systolic in 90s, lasix was held on 9/13 - BP has stabilized and we added Lasix 40 mg by mouth daily 9/14 - 08/01/2017 worsening respiratory status with more crackles and rhonchi, lasix changed to 40 mg IV BID - pt reports feeling better overall and CXR from 9/16 notable for some improvement - will consult with cardiology for assistance on further management of CHF  - will continue to monitor daily weights, strict I/O  Active Problems:   Essential hypertension - We have held lisinopril as well as Norvasc 07/28/2017 due to low BP - Lasix IV was changed to by mouth form as noted above 9/12 - on 9/13, Lasix and Metoprolol also held due to persistent hypotension and bradycardia - As blood pressure stabilized, Lasix 40 mg by mouth daily added back on the medical regimen on 07/30/2017 - Due to more  crackles on exam and worsening respiratory status, Lasix IV twice a day added 08/01/2017 - will continue same regimen with lasix  - beta blocker still on hold due to bradycardia  - BP is currently stable     ATRIAL FIBRILLATION, CHRONIC, bradycardia  - Has been on metoprolol and digoxin - Metoprolol held since 9/13 - lowered the dose of Digoxin from 0.25 to 0.125 mg (this was discussed with cardiologist on call), done on 9/13 - despite measures above, pt remains bradycardic, cardiology was consulted for further assistance and recommendations     Increasing creatinine - in the setting of lasix use, Cr has now stabilized and remains WNL - will repeat BMP in AM    BPH (benign prostatic hyperplasia) - Continue home regimen medications  - Urology consulted, cath changed, assistance appreciated    Dementia - mental status at baseline     Hyponatremia - In the setting of Lasix use - mild but overall stable - BMP in AM    Thrombocytopenia - mild and likely reactive - resolved    DVT prophylaxis: Lovenox subcutaneous Code Status: Full Family Communication: pt and son at bedside  Disposition Plan: plan d/c when HR stabilizes, awaiting for cardiology recommendations   Consultants:   Urology, asked to change suprapubic cath  Procedures:   None  Antimicrobials:   None  Subjective: Pt reports feeling better but still with dry cough.   Objective: Vitals:   08/01/17 1937 08/01/17 2154 08/02/17 9562 08/02/17 1308  BP:  (!) 128/50 (!) 132/57   Pulse: 78 62 (!) 47   Resp: Temp:  98.9 F (37.2 C) 98.8 F (37.1 C)   TempSrc:  Oral Oral   SpO2: 93% 92% 93% 92%  Weight:   85.9 kg (189 lb 6 oz)   Height:        Intake/Output Summary (Last 24 hours) at 08/02/17 1012 Last data filed at 08/01/17 2300  Gross per 24 hour  Intake              360 ml  Output             2700 ml  Net            -2340 ml   Filed Weights   07/31/17 0638 08/01/17 0540 08/02/17 0558    Weight: 88.1 kg (194 lb 4.8 oz) 87.9 kg (193 lb 12.6 oz) 85.9 kg (189 lb 6 oz)   Physical Exam  Constitutional: Appears calm, NAD CVS: bradycardic, no gallops  Pulmonary: good inspiratory effort, overall good air movement but slightly diminished at bases  Abdominal: Soft. BS +,  no distension, tenderness, rebound or guarding.  Musculoskeletal: Normal range of motion. Trace bilateral LE edema with chronic venous stasis changes.  Neuro: Alert. Normal reflexes, muscle tone coordination. No cranial nerve deficit. Skin: Skin is warm and dry. No rash noted. Not diaphoretic. No erythema. No pallor.  Psychiatric: Normal mood and affect. Behavior, judgment, thought content normal.   Data Reviewed: I have personally reviewed following labs and imaging studies  CBC:  Recent Labs Lab 07/29/17 0524 07/30/17 0523 07/31/17 0827  WBC 7.1 6.7 7.7  HGB 14.1 14.6 15.0  HCT 43.3 44.5 45.5  MCV 92.5 92.3 92.9  PLT 151 150 156   Basic Metabolic Panel:  Recent Labs Lab 07/27/17 0524 07/28/17 0602 07/29/17 0524 07/30/17 0523 07/31/17 0827  NA 134* 134* 134* 135 134*  K 3.9 3.6 3.7 4.5 4.1  CL 94* 92* 92* 97* 96*  CO2 33* 33* 34* 30 29  GLUCOSE 188* 182* 184* 176* 199*  BUN 29* 35* 42* 38* 32*  CREATININE 1.13 1.22 1.45* 1.11 1.12  CALCIUM 8.6* 8.6* 8.3* 8.7* 8.9   Radiology Studies: Dg Chest 2 View  Result Date: 08/01/2017 CLINICAL DATA:  Congestive heart failure EXAM: CHEST  2 VIEW COMPARISON:  07/26/2017 FINDINGS: Mild-to-moderate enlargement of the cardiopericardial silhouette. Atherosclerotic calcification of the aortic arch. Improved interstitial edema compared the prior exam. There is still pulmonary venous hypertension and some very faint interstitial accentuation. No airspace edema identified. Thoracic spondylosis.  No significant pleural effusion. IMPRESSION: 1. Cardiomegaly with pulmonary venous hypertension but improving interstitial edema compared to the the exam from 6 days  ago. 2.  Aortic Atherosclerosis (ICD10-I70.0). Electronically Signed   By: Gaylyn Rong M.D.   On: 08/01/2017 13:01    Scheduled Meds: . aspirin EC  81 mg Oral Daily  . cholecalciferol  1,000 Units Oral Daily  . digoxin  0.125 mg Oral Daily  . enoxaparin (LOVENOX) injection  40 mg Subcutaneous Q24H  . finasteride  5 mg Oral Daily  . mometasone-formoterol  2 puff Inhalation BID  . oxybutynin  5 mg Oral BID  . simvastatin  20 mg Oral q1800  . sodium chloride flush  3 mL Intravenous Q12H  . tamsulosin  0.4 mg Oral Daily   Continuous Infusions: . sodium chloride      LOS: 7 days   Time spent: 25  minutes   Debbora Presto, MD [facility] Pager [phone]  If 7PM-7AM, please contact night-coverage [email or web address] Password [email or web address] 08/02/2017, 10:12 AM

## 2017-08-02 NOTE — Progress Notes (Signed)
Patient refused labs again this am.

## 2017-08-03 MED ORDER — FUROSEMIDE 40 MG PO TABS: 40.0000 mg | ORAL_TABLET | Freq: Two times a day (BID) | ORAL | 0 refills | Status: AC

## 2017-08-03 NOTE — Care Management (Addendum)
CM will fax DC summary to [facility] when [facility] is discharged. [facility].  ADDENDUM: Sp[facility] with [facility], patient has an appt already scheduled on October 3rd at 11:30. No earlier appt. could be arranged. Patient will see Dr. Leonie Douglas. Will add appt. Time to AVS.

## 2017-08-03 NOTE — Discharge Instructions (Signed)
Heart Failure °Heart failure means your heart has trouble pumping blood. This makes it hard for your body to work well. Heart failure is usually a long-term (chronic) condition. You must take good care of yourself and follow your doctor's treatment plan. °Follow these instructions at home: °· Take your heart medicine as told by your doctor. °? Do not stop taking medicine unless your doctor tells you to. °? Do not skip any dose of medicine. °? Refill your medicines before they run out. °? Take other medicines only as told by your doctor or pharmacist. °· Stay active if told by your doctor. The elderly and people with severe heart failure should talk with a doctor about physical activity. °· Eat heart-healthy foods. Choose foods that are without trans fat and are low in saturated fat, cholesterol, and salt (sodium). This includes fresh or frozen fruits and vegetables, fish, lean meats, fat-free or low-fat dairy foods, whole grains, and high-fiber foods. Lentils and dried peas and beans (legumes) are also good choices. °· Limit salt if told by your doctor. °· Cook in a healthy way. Roast, grill, broil, bake, poach, steam, or stir-fry foods. °· Limit fluids as told by your doctor. °· Weigh yourself every morning. Do this after you pee (urinate) and before you eat breakfast. Write down your weight to give to your doctor. °· Take your blood pressure and write it down if your doctor tells you to. °· Ask your doctor how to check your pulse. Check your pulse as told. °· Lose weight if told by your doctor. °· Stop smoking or chewing tobacco. Do not use gum or patches that help you quit without your doctor's approval. °· Schedule and go to doctor visits as told. °· Nonpregnant women should have no more than 1 drink a day. Men should have no more than 2 drinks a day. Talk to your doctor about drinking alcohol. °· Stop illegal drug use. °· Stay current with shots (immunizations). °· Manage your health conditions as told by your  doctor. °· Learn to manage your stress. °· Rest when you are tired. °· If it is really hot outside: °? Avoid intense activities. °? Use air conditioning or fans, or get in a cooler place. °? Avoid caffeine and alcohol. °? Wear loose-fitting, lightweight, and light-colored clothing. °· If it is really cold outside: °? Avoid intense activities. °? Layer your clothing. °? Wear mittens or gloves, a hat, and a scarf when going outside. °? Avoid alcohol. °· Learn about heart failure and get support as needed. °· Get help to maintain or improve your quality of life and your ability to care for yourself as needed. °Contact a doctor if: °· You gain weight quickly. °· You are more short of breath than usual. °· You cannot do your normal activities. °· You tire easily. °· You cough more than normal, especially with activity. °· You have any or more puffiness (swelling) in areas such as your hands, feet, ankles, or belly (abdomen). °· You cannot sleep because it is hard to breathe. °· You feel like your heart is beating fast (palpitations). °· You get dizzy or light-headed when you stand up. °Get help right away if: °· You have trouble breathing. °· There is a change in mental status, such as becoming less alert or not being able to focus. °· You have chest pain or discomfort. °· You faint. °This information is not intended to replace advice given to you by your health care provider. Make sure you   discuss any questions you have with your health care provider. °Document Released: 08/11/2008 Document Revised: 04/09/2016 Document Reviewed: 12/19/2012 °Elsevier Interactive Patient Education © 2017 [employer] ° °

## 2017-08-03 NOTE — Care Management Important Message (Signed)
Important Message  Patient Details  Name: Vincent [name] MRN: [id number] Date of Birth: 04-01-1928   Medicare Important Message Given:  Yes    Heran Campau, Chrystine Oiler, RN 08/03/2017, 9:11 AM

## 2017-08-03 NOTE — Discharge Summary (Signed)
Physician Discharge Summary  Vincent [name] ZOX:[id number] DOB: May 09, 1928 DOA: 07/26/2017  PCP: Samuel Jester, DO  Admit date: 07/26/2017 Discharge date: 08/03/2017  Recommendations for Outpatient Follow-up:  1. Pt will need to follow up with PCP in 2-3 weeks post discharge 2. Please obtain BMP to evaluate electrolytes and kidney function 3. Please also check CBC to evaluate Hg and Hct levels 4. After consultation with cardiologist, digoxin has been discontinued until patient seen by primary care provider 5. In addition, please note that metoprolol was attempted during hospital stay but patient became severely bradycardic with heart rate in 20s to 40s. Metoprolol was therefore discontinued while inpatient and no prescription provided on discharge. If in the future, arrhythmia becomes an issue, specifically tachycardia, low dose beta blocker can be attempted. 6. In addition please note that Lasix dose was increased from 20 mg by mouth daily to 40 mg by mouth twice a day 7. Weight on discharge 189 pounds.Please also note that due to hypotension, amlodipine was discontinued and can be resumed in near future if blood pressure tolerates  Discharge Diagnoses:  Principal Problem:   Acute on chronic diastolic CHF (congestive heart failure) (HCC) Active Problems:   Essential hypertension   ATRIAL FIBRILLATION, CHRONIC   BPH (benign prostatic hyperplasia)  Discharge Condition: Stable  Diet recommendation: Heart healthy diet discussed in details   History of present illness:  Patient is 83 year old male with known history of chronic diastolic CHF, presented from home with main concern of several days duration of progressively worsening dyspnea that initially started with exertion and has progressed to dyspnea at rest. This was associated with lower extremity swelling, 3 pillow orthopnea.  Assessment & Plan:   Principal Problem:   Acute on chronic diastolic CHF (congestive heart failure)  (HCC) - Patient is clinically improving, was treated with Lasix 40 mg IV twice a day, was changed to PO form on 9/12 - Echocardiogram notable for vigorous systolic function, EF 65%, unable to adequately evaluate diastolic function due to atrial fibrillation - this is the weight trend in the past 72 hours:      Filed Weights   07/31/17 0638 08/01/17 0540 08/02/17 0558  Weight: 88.1 kg (194 lb 4.8 oz) 87.9 kg (193 lb 12.6 oz) 85.9 kg (189 lb 6 oz)  - Due to low blood pressure, systolic in 90s, lasix was held on 9/13 - BP has stabilized and we added Lasix 40 mg by mouth daily 9/14 - 08/01/2017 worsening respiratory status with more crackles and rhonchi, lasix changed to 40 mg IV BID - pt reports feeling better overall and CXR from 9/16 notable for some improvement - continue lasix on discharge on follow up as outlined under medications sections   Active Problems:   Essential hypertension - We have held lisinopril as well as Norvasc 07/28/2017 due to low BP - Lasix IV was changed to by mouth form as noted above 9/12 - on 9/13, Lasix and Metoprolol also held due to persistent hypotension and bradycardia - As blood pressure stabilized, Lasix 40 mg by mouth daily added back on the medical regimen on 07/30/2017 - Due to more crackles on exam and worsening respiratory status, Lasix IV twice a day added 08/01/2017 - will continue same regimen with lasix  - beta blocker still on hold due to bradycardia  - BP is currently stable, ok to resume lasix on discharge     ATRIAL FIBRILLATION, CHRONIC, bradycardia  - Has been on metoprolol and digoxin - due to bradycardia,  digoxin stopped  - Metoprolol held since 9/13 and not started on discharge until pt seen by PCP    Increasing creatinine - in the setting of lasix use, Cr has now stabilized     BPH (benign prostatic hyperplasia) - Continue home regimen medications  - Urology consulted, cath changed, assistance appreciated    Dementia -  mental status at baseline     Hyponatremia - In the setting of Lasix use - mild but overall stable    Thrombocytopenia - mild and likely reactive - resolved    DVT prophylaxis: Lovenox subcutaneous Code Status: Full Family Communication: pt and son at bedside  Disposition Plan: d/c home   Consultants:   Urology, asked to change suprapubic cath  Procedures:   None  Antimicrobials:   None  Procedures/Studies: Dg Chest 2 View  Result Date: 08/01/2017 CLINICAL DATA:  Congestive heart failure EXAM: CHEST  2 VIEW COMPARISON:  07/26/2017 FINDINGS: Mild-to-moderate enlargement of the cardiopericardial silhouette. Atherosclerotic calcification of the aortic arch. Improved interstitial edema compared the prior exam. There is still pulmonary venous hypertension and some very faint interstitial accentuation. No airspace edema identified. Thoracic spondylosis.  No significant pleural effusion. IMPRESSION: 1. Cardiomegaly with pulmonary venous hypertension but improving interstitial edema compared to the the exam from 6 days ago. 2.  Aortic Atherosclerosis (ICD10-I70.0). Electronically Signed   By: Gaylyn Rong M.D.   On: 08/01/2017 13:01   Dg Chest 2 View  Result Date: 07/26/2017 CLINICAL DATA:  Shortness of breath. EXAM: CHEST  2 VIEW COMPARISON:  03/18/2016 FINDINGS: The heart size is moderately enlarged. There is aortic atherosclerosis. Mild diffuse pulmonary edema is again identified and appears similar to previous exam. IMPRESSION: 1. Cardiac enlargement, Aortic Atherosclerosis (ICD10-I70.0) and mild pulmonary edema compatible with CHF. Electronically Signed   By: Signa Kell M.D.   On: 07/26/2017 09:52   Ct Angio Chest Pe W/cm &/or Wo Cm  Result Date: 07/26/2017 CLINICAL DATA:  Shortness of breath. EXAM: CT ANGIOGRAPHY CHEST WITH CONTRAST TECHNIQUE: Multidetector CT imaging of the chest was performed using the standard protocol during bolus administration of  intravenous contrast. Multiplanar CT image reconstructions and MIPs were obtained to evaluate the vascular anatomy. CONTRAST:  100 cc Isovue 370 COMPARISON:  Chest x-ray dated 07/26/2017 and chest CT dated 10/09/2013 FINDINGS: Cardiovascular: Satisfactory opacification of the pulmonary arteries to the segmental level. No evidence of pulmonary embolism. Cardiomegaly. No pericardial effusion. Extensive aortic atherosclerosis. Extensive coronary artery calcification. Mediastinum/Nodes: Small reactive mediastinal lymph nodes. No significant adenopathy. Lungs/Pleura: Minimal interstitial edema at the lung bases. No pleural effusions. Calcified granuloma at the right lung base posteriorly. Upper Abdomen: No acute abnormality. Musculoskeletal: No chest wall abnormality. No acute or significant osseous findings. Review of the MIP images confirms the above findings. IMPRESSION: 1. No pulmonary emboli. 2. Chronic cardiomegaly with minimal interstitial pulmonary edema at the lung bases. 3. Extensive aortic atherosclerosis. 4. Coronary artery calcification. Electronically Signed   By: Francene Boyers M.D.   On: 07/26/2017 12:11   US Venous Img Lower Unilateral Right  Result Date: 07/26/2017 CLINICAL DATA:  Right lower extremity pain and edema with redness for 1 week. Shortness of breath for 1 day. EXAM: Right LOWER EXTREMITY VENOUS DOPPLER ULTRASOUND TECHNIQUE: Gray-scale sonography with graded compression, as well as color Doppler and duplex ultrasound were performed to evaluate the lower extremity deep venous systems from the level of the common femoral vein and including the common femoral, femoral, profunda femoral, popliteal and calf veins including the posterior  tibial, peroneal and gastrocnemius veins when visible. The superficial great saphenous vein was also interrogated. Spectral Doppler was utilized to evaluate flow at rest and with distal augmentation maneuvers in the common femoral, femoral and popliteal veins.  COMPARISON:  None. FINDINGS: Contralateral Common Femoral Vein: Respiratory phasicity is normal and symmetric with the symptomatic side. No evidence of thrombus. Normal compressibility. Common Femoral Vein: No evidence of thrombus. Normal compressibility, respiratory phasicity and response to augmentation. Saphenofemoral Junction: No evidence of thrombus. Normal compressibility and flow on color Doppler imaging. Profunda Femoral Vein: No evidence of thrombus. Normal compressibility and flow on color Doppler imaging. Femoral Vein: No evidence of thrombus. Normal compressibility, respiratory phasicity and response to augmentation. Popliteal Vein: No evidence of thrombus. Normal compressibility, respiratory phasicity and response to augmentation. Calf Veins: No evidence of thrombus. Normal compressibility and flow on color Doppler imaging. Superficial Great Saphenous Vein: No evidence of thrombus. Normal compressibility and flow on color Doppler imaging. Venous Reflux:  None. Other Findings: Edema is seen throughout the majority of the left lower extremity. IMPRESSION: No evidence of DVT within the right lower extremity. Electronically Signed   By: Leanna Battles M.D.   On: 07/26/2017 11:13   Dg Knee Complete 4 Views Right  Result Date: 07/26/2017 CLINICAL DATA:  Right knee pain secondary to a fall. EXAM: RIGHT KNEE - COMPLETE 4+ VIEW COMPARISON:  None FINDINGS: There is no fracture or dislocation or joint effusion. There is medial joint space narrowing with slight marginal osteophyte formation in the medial and patellofemoral compartments. There is soft tissue swelling anterior to the patella. Extensive arterial vascular calcifications around the knee joint. IMPRESSION: No acute bone abnormality. Arthritic changes as described. Prepatellar soft tissue swelling. Electronically Signed   By: Francene Boyers M.D.   On: 07/26/2017 11:23   Dg Foot Complete Right  Result Date: 07/26/2017 CLINICAL DATA:  Bruising and  swelling of the right foot secondary to a fall. EXAM: RIGHT FOOT COMPLETE - 3+ VIEW COMPARISON:  None. FINDINGS: There is no fracture or dislocation. Soft tissue swelling over the forefoot. Moderate arthritis at the first MTP joint. No other significant abnormality. IMPRESSION: No acute abnormalities. Electronically Signed   By: Francene Boyers M.D.   On: 07/26/2017 10:34    Discharge Exam: Vitals:   08/03/17 0528 08/03/17 0957  BP: 122/61   Pulse: 78   Resp: 20   Temp: 98.3 F (36.8 C)   SpO2: 93% (!) 89%   Vitals:   08/02/17 1600 08/02/17 2136 08/03/17 0528 08/03/17 0957  BP:  (!) 120/59 122/61   Pulse:  88 78   Resp:  18 20   Temp:  98.4 F (36.9 C) 98.3 F (36.8 C)   TempSrc:  Oral Oral   SpO2: (!) 87% 92% 93% (!) 89%  Weight:   85.8 kg (189 lb 3.9 oz)   Height:        General: Pt is alert, follows commands appropriately, not in acute distress Cardiovascular: Regular rate and rhythm, no rubs, no gallops Respiratory: Clear to auscultation bilaterally, mild rhonchi at bases  Abdominal: Soft, non tender, non distended, bowel sounds +, no guarding Neuro: Grossly nonfocal  Discharge Instructions  Discharge Instructions    Diet - low sodium heart healthy    Complete by:  As directed    Increase activity slowly    Complete by:  As directed       Allergies as of 08/03/2017      Reactions   Hytrin [terazosin]  Other (See Comments)   Allergies per [facility]   Simvastatin Other (See Comments)   Allergy per [facility]      Medication List    STOP taking these medications   amLODipine 10 MG tablet Commonly known as:  NORVASC   digoxin 0.25 MG tablet Commonly known as:  LANOXIN     TAKE these medications   albuterol (2.5 MG/3ML) 0.083% nebulizer solution Commonly known as:  PROVENTIL Take 2.5 mg by nebulization 2 (two) times daily as needed for wheezing or shortness of breath.   albuterol 108 (90 Base) MCG/ACT inhaler Commonly known as:  PROVENTIL  HFA;VENTOLIN HFA Inhale 2 puffs into the lungs every 6 (six) hours as needed. For asthma   aspirin EC 81 MG tablet Take 81 mg by mouth daily.   budesonide-formoterol 160-4.5 MCG/ACT inhaler Commonly known as:  SYMBICORT Inhale 2 puffs into the lungs 2 (two) times daily.   cholecalciferol 1000 units tablet Commonly known as:  VITAMIN D Take 1,000 Units by mouth daily.   finasteride 5 MG tablet Commonly known as:  PROSCAR Take 5 mg by mouth daily.   furosemide 40 MG tablet Commonly known as:  LASIX Take 1 tablet (40 mg total) by mouth 2 (two) times daily. What changed:  how much to take  when to take this   oxybutynin 5 MG tablet Commonly known as:  DITROPAN Take 5 mg by mouth 2 (two) times daily.   oxyCODONE-acetaminophen 10-325 MG tablet Commonly known as:  PERCOCET Take 1 tablet by mouth every 6 (six) hours.   sennosides-docusate sodium 8.6-50 MG tablet Commonly known as:  SENOKOT-S Take 1 tablet by mouth daily as needed for constipation.   simvastatin 20 MG tablet Commonly known as:  ZOCOR Take 20 mg by mouth daily.   tamsulosin 0.4 MG Caps capsule Commonly known as:  FLOMAX Take 0.4 mg by mouth daily.            Discharge Care Instructions        Start     Ordered   08/03/17 0000  furosemide (LASIX) 40 MG tablet  2 times daily     08/03/17 1047   08/03/17 0000  Increase activity slowly     08/03/17 1047   08/03/17 0000  Diet - low sodium heart healthy     08/03/17 1047        Follow-up Information    Dr. Leonie Douglas at [facility] Follow up.   Why:  APPT August 18, 2017 at 11:30 (earliest appt time available)       Samuel Jester, DO Follow up.   Contact information: [address] [place] [address] [phone]            The results of significant diagnostics from this hospitalization (including imaging, microbiology, ancillary and laboratory) are listed below for reference.     Microbiology: No results  found for this or any previous visit (from the past 240 hour(s)).   Labs: Basic Metabolic Panel:  Recent Labs Lab 07/28/17 0602 07/29/17 0524 07/30/17 0523 07/31/17 0827  NA 134* 134* 135 134*  K 3.6 3.7 4.5 4.1  CL 92* 92* 97* 96*  CO2 33* 34* 30 29  GLUCOSE 182* 184* 176* 199*  BUN 35* 42* 38* 32*  CREATININE 1.22 1.45* 1.11 1.12  CALCIUM 8.6* 8.3* 8.7* 8.9   CBC:  Recent Labs Lab 07/29/17 0524 07/30/17 0523 07/31/17 0827  WBC 7.1 6.7 7.7  HGB 14.1 14.6  15.0  HCT 43.3 44.5 45.5  MCV 92.5 92.3 92.9  PLT 151 150 156   BNP (last 3 results)  Recent Labs  07/26/17 0925  BNP 234.0*   SIGNED: Time coordinating discharge: 60 minutes  Debbora Presto, MD  [facility] 08/03/2017, 10:48 AM Pager [phone]  If 7PM-7AM, please contact night-coverage [email or web address] Password [email or web address]

## 2017-08-03 NOTE — Care Management (Signed)
CM spoke with Vincent [name]- patient will have home health with [facility]. She is aware [facility]. No other CM needs.

## 2017-08-03 NOTE — Final Progress Note (Signed)
NURSING PROGRESS NOTE  Vincent [name] [id number] Discharge Data: 08/03/2017 2:32 PM Attending Provider: Dorothea Ogle, MD [id number], Aram Beecham, DO     Ursula Beath to be D/C'd Home with home health per MD order.  Discussed with the patient's caregiver Fayrene Fearing the After Visit Summary and all questions fully answered. All IV's discontinued with no bleeding noted. All belongings returned to patient for patient to take home.   Last Vital Signs:  Blood pressure 122/61, pulse 78, temperature 98.3 F (36.8 C), temperature source Oral, resp. rate 20, height  (1.651 m), weight 85.8 kg (189 lb 3.9 oz), SpO2 (!) 89 %.  Discharge Medication List Allergies as of 08/03/2017      Reactions   Hytrin [terazosin] Other (See Comments)   Allergies per [facility]   Simvastatin Other (See Comments)   Allergy per [facility]      Medication List    STOP taking these medications   amLODipine 10 MG tablet Commonly known as:  NORVASC   digoxin 0.25 MG tablet Commonly known as:  LANOXIN     TAKE these medications   albuterol (2.5 MG/3ML) 0.083% nebulizer solution Commonly known as:  PROVENTIL Take 2.5 mg by nebulization 2 (two) times daily as needed for wheezing or shortness of breath.   albuterol 108 (90 Base) MCG/ACT inhaler Commonly known as:  PROVENTIL HFA;VENTOLIN HFA Inhale 2 puffs into the lungs every 6 (six) hours as needed. For asthma   aspirin EC 81 MG tablet Take 81 mg by mouth daily.   budesonide-formoterol 160-4.5 MCG/ACT inhaler Commonly known as:  SYMBICORT Inhale 2 puffs into the lungs 2 (two) times daily.   cholecalciferol 1000 units tablet Commonly known as:  VITAMIN D Take 1,000 Units by mouth daily.   finasteride 5 MG tablet Commonly known as:  PROSCAR Take 5 mg by mouth daily.   furosemide 40 MG tablet Commonly known as:  LASIX Take 1 tablet (40 mg total) by mouth 2 (two) times daily. What changed:  how much to take  when to take this    oxybutynin 5 MG tablet Commonly known as:  DITROPAN Take 5 mg by mouth 2 (two) times daily.   oxyCODONE-acetaminophen 10-325 MG tablet Commonly known as:  PERCOCET Take 1 tablet by mouth every 6 (six) hours.   sennosides-docusate sodium 8.6-50 MG tablet Commonly known as:  SENOKOT-S Take 1 tablet by mouth daily as needed for constipation.   simvastatin 20 MG tablet Commonly known as:  ZOCOR Take 20 mg by mouth daily.   tamsulosin 0.4 MG Caps capsule Commonly known as:  FLOMAX Take 0.4 mg by mouth daily.            Discharge Care Instructions        Start     Ordered   08/03/17 0000  furosemide (LASIX) 40 MG tablet  2 times daily     08/03/17 1047   08/03/17 0000  Increase activity slowly     08/03/17 1047   08/03/17 0000  Diet - low sodium heart healthy     08/03/17 1047

## 2017-09-16 DEATH — deceased
